# Patient Record
Sex: Male | Born: 1965 | Race: Black or African American | Hispanic: No | Marital: Single | Smoking: Never smoker
Health system: Southern US, Community
[De-identification: ages and names within clinical notes are randomized; demographics above are authoritative.]

## PROBLEM LIST (undated history)

## (undated) DIAGNOSIS — K219 Gastro-esophageal reflux disease without esophagitis: Secondary | ICD-10-CM

## (undated) DIAGNOSIS — E785 Hyperlipidemia, unspecified: Secondary | ICD-10-CM

## (undated) DIAGNOSIS — Z95810 Presence of automatic (implantable) cardiac defibrillator: Secondary | ICD-10-CM

## (undated) DIAGNOSIS — R51 Headache: Secondary | ICD-10-CM

## (undated) DIAGNOSIS — Z21 Asymptomatic human immunodeficiency virus [HIV] infection status: Secondary | ICD-10-CM

## (undated) DIAGNOSIS — Z8719 Personal history of other diseases of the digestive system: Secondary | ICD-10-CM

## (undated) DIAGNOSIS — I219 Acute myocardial infarction, unspecified: Secondary | ICD-10-CM

## (undated) DIAGNOSIS — B20 Human immunodeficiency virus [HIV] disease: Secondary | ICD-10-CM

## (undated) DIAGNOSIS — R519 Headache, unspecified: Secondary | ICD-10-CM

## (undated) DIAGNOSIS — M545 Low back pain, unspecified: Secondary | ICD-10-CM

## (undated) DIAGNOSIS — I509 Heart failure, unspecified: Secondary | ICD-10-CM

## (undated) DIAGNOSIS — I10 Essential (primary) hypertension: Secondary | ICD-10-CM

## (undated) DIAGNOSIS — G8929 Other chronic pain: Secondary | ICD-10-CM

## (undated) DIAGNOSIS — I2510 Atherosclerotic heart disease of native coronary artery without angina pectoris: Secondary | ICD-10-CM

## (undated) DIAGNOSIS — Z8711 Personal history of peptic ulcer disease: Secondary | ICD-10-CM

## (undated) DIAGNOSIS — G43909 Migraine, unspecified, not intractable, without status migrainosus: Secondary | ICD-10-CM

## (undated) DIAGNOSIS — R197 Diarrhea, unspecified: Secondary | ICD-10-CM

## (undated) DIAGNOSIS — Z9889 Other specified postprocedural states: Secondary | ICD-10-CM

## (undated) DIAGNOSIS — J45909 Unspecified asthma, uncomplicated: Secondary | ICD-10-CM

## (undated) DIAGNOSIS — K259 Gastric ulcer, unspecified as acute or chronic, without hemorrhage or perforation: Secondary | ICD-10-CM

## (undated) DIAGNOSIS — M1990 Unspecified osteoarthritis, unspecified site: Secondary | ICD-10-CM

## (undated) DIAGNOSIS — E162 Hypoglycemia, unspecified: Secondary | ICD-10-CM

## (undated) DIAGNOSIS — J349 Unspecified disorder of nose and nasal sinuses: Secondary | ICD-10-CM

## (undated) DIAGNOSIS — J189 Pneumonia, unspecified organism: Secondary | ICD-10-CM

## (undated) DIAGNOSIS — M542 Cervicalgia: Secondary | ICD-10-CM

## (undated) DIAGNOSIS — F32A Depression, unspecified: Secondary | ICD-10-CM

## (undated) DIAGNOSIS — F4310 Post-traumatic stress disorder, unspecified: Secondary | ICD-10-CM

## (undated) DIAGNOSIS — J392 Other diseases of pharynx: Secondary | ICD-10-CM

## (undated) DIAGNOSIS — F419 Anxiety disorder, unspecified: Secondary | ICD-10-CM

## (undated) DIAGNOSIS — F40240 Claustrophobia: Secondary | ICD-10-CM

## (undated) DIAGNOSIS — G709 Myoneural disorder, unspecified: Secondary | ICD-10-CM

## (undated) DIAGNOSIS — R1310 Dysphagia, unspecified: Secondary | ICD-10-CM

## (undated) DIAGNOSIS — H9390 Unspecified disorder of ear, unspecified ear: Secondary | ICD-10-CM

## (undated) DIAGNOSIS — Z860100 Personal history of colon polyps, unspecified: Secondary | ICD-10-CM

## (undated) HISTORY — PX: SPLENECTOMY, TOTAL: SHX788

## (undated) HISTORY — PX: HERNIA REPAIR: SHX51

## (undated) HISTORY — DX: Unspecified asthma, uncomplicated: J45.909

## (undated) HISTORY — DX: Other specified postprocedural states: Z98.890

## (undated) HISTORY — PX: TONSILLECTOMY: SUR1361

## (undated) HISTORY — DX: Diarrhea, unspecified: R19.7

## (undated) HISTORY — DX: Myoneural disorder, unspecified: G70.9

## (undated) HISTORY — DX: Personal history of colon polyps, unspecified: Z86.0100

## (undated) HISTORY — DX: Cervicalgia: M54.2

## (undated) HISTORY — PX: OTHER SURGICAL HISTORY: SHX169

## (undated) HISTORY — PX: WISDOM TOOTH EXTRACTION: SHX21

## (undated) HISTORY — DX: Hyperlipidemia, unspecified: E78.5

## (undated) HISTORY — DX: Atherosclerotic heart disease of native coronary artery without angina pectoris: I25.10

---

## 1989-08-30 HISTORY — PX: CYSTECTOMY: SUR359

## 2007-07-24 ENCOUNTER — Emergency Department: Admit: 2007-07-24 | Payer: Self-pay | Source: Emergency Department | Admitting: Emergency Medicine

## 2007-07-24 LAB — BASIC METABOLIC PANEL
BUN: 12 mg/dL (ref 8–20)
CO2: 28 mEq/L (ref 21–30)
Calcium: 8.9 mg/dL (ref 8.6–10.2)
Chloride: 104 mEq/L (ref 98–107)
Creatinine: 1.1 mg/dL (ref 0.6–1.5)
Glucose: 88 mg/dL (ref 70–100)
Potassium: 4 mEq/L (ref 3.6–5.0)
Sodium: 140 mEq/L (ref 136–146)

## 2007-07-24 LAB — CK: Creatine Kinase (CK): 515 U/L — ABNORMAL HIGH (ref 20–297)

## 2007-07-24 LAB — GFR

## 2007-07-25 LAB — CBC WITH AUTO DIFFERENTIAL CERNER
Basophils Absolute: 0 /mm3 (ref 0.0–0.2)
Basophils: 0 % (ref 0–2)
Eosinophils Absolute: 0.2 /mm3 (ref 0.0–0.7)
Eosinophils: 3 % (ref 0–5)
Granulocytes Absolute: 5.2 /mm3 (ref 1.8–8.1)
Hematocrit: 41.8 % — ABNORMAL LOW (ref 42.0–52.0)
Hgb: 14.2 G/DL (ref 13.0–17.0)
Lymphocytes Absolute: 3.5 /mm3 (ref 0.5–4.4)
Lymphocytes: 36 % (ref 15–41)
MCH: 30.9 PG (ref 28.0–32.0)
MCHC: 34 G/DL (ref 32.0–36.0)
MCV: 91.1 FL (ref 80.0–100.0)
MPV: 8.4 FL (ref 7.4–10.4)
Monocytes Absolute: 0.8 /mm3 (ref 0.0–1.2)
Monocytes: 9 % (ref 0–11)
Neutrophils %: 53 % (ref 52–75)
Platelets: 266 /mm3 (ref 140–400)
RBC: 4.59 /mm3 — ABNORMAL LOW (ref 4.70–6.00)
RDW: 13.6 % (ref 11.5–15.0)
WBC: 9.7 /mm3 (ref 3.5–10.8)

## 2007-07-25 LAB — D-DIMER - SOFT: D-Dimer: 260 ng FEU

## 2007-07-25 LAB — CKMB: CKMB Confirm: 3 ng/mL (ref 0–5)

## 2008-04-11 ENCOUNTER — Emergency Department: Admit: 2008-04-11 | Payer: Self-pay | Source: Emergency Department | Admitting: Emergency Medicine

## 2009-11-06 ENCOUNTER — Emergency Department: Admit: 2009-11-06 | Payer: Self-pay | Source: Emergency Department | Admitting: Emergency Medicine

## 2009-11-06 LAB — ACETAMINOPHEN LEVEL: Acetaminophen Level: <10 ug/mL

## 2009-11-06 LAB — CBC AND DIFFERENTIAL
Basophils Absolute: 0 /mm3 (ref 0.0–0.2)
Basophils: 0 % (ref 0–2)
Eosinophils Absolute: 0.2 /mm3 (ref 0.0–0.7)
Eosinophils: 2 % (ref 0–5)
Granulocytes Absolute: 5.8 /mm3 (ref 1.8–8.1)
Hematocrit: 46 % (ref 42.0–52.0)
Hgb: 15 G/DL (ref 13.0–17.0)
Immature Granulocytes Absolute: 0
Immature Granulocytes: 0 %
Lymphocytes Absolute: 2.6 /mm3 (ref 0.5–4.4)
Lymphocytes: 28 % (ref 15–41)
MCH: 31 PG (ref 28.0–32.0)
MCHC: 32.6 G/DL (ref 32.0–36.0)
MCV: 95 FL (ref 80.0–100.0)
MPV: 10.4 FL (ref 9.4–12.3)
Monocytes Absolute: 0.8 /mm3 (ref 0.0–1.2)
Monocytes: 8 % (ref 0–11)
Neutrophils %: 62 % (ref 52–75)
Platelets: 250 /mm3 (ref 140–400)
RBC: 4.84 /mm3 (ref 4.70–6.00)
RDW: 12.6 % (ref 11.5–15.0)
WBC: 9.36 /mm3 (ref 3.50–10.80)

## 2009-11-06 LAB — BASIC METABOLIC PANEL
BUN: 16 mg/dL (ref 8–20)
CO2: 28 mEq/L (ref 21–30)
Calcium: 9.2 mg/dL (ref 8.6–10.2)
Chloride: 102 mEq/L (ref 98–107)
Creatinine: 1.2 mg/dL (ref 0.6–1.5)
Glucose: 96 mg/dL (ref 70–100)
Potassium: 4.6 mEq/L (ref 3.6–5.0)
Sodium: 141 mEq/L (ref 136–146)

## 2009-11-06 LAB — URINALYSIS WITH MICROSCOPIC
Bilirubin, UA: NEGATIVE
Blood, UA: NEGATIVE
Glucose, UA: NEGATIVE
Ketones UA: NEGATIVE
Leukocyte Esterase, UA: NEGATIVE
Nitrite, UA: NEGATIVE
Protein, UR: 30
RBC, UA: 3 /HPF (ref 0–3)
Specific Gravity UA POCT: 1.019 (ref 1.001–1.035)
Squamous Epithelial Cells, Urine: <=1 /HPF
Urine pH: 8 (ref 5.0–8.0)
Urobilinogen, UA: 2 mg/dL (ref 0.2–2.0)

## 2009-11-06 LAB — RAPID DRUG SCREEN, URINE
Barbiturate Screen, UR: NEGATIVE
Benzodiazepine Screen, UR: NEGATIVE
Cocaine, UR: POSITIVE
Opiate Screen, UR: NEGATIVE
PCP Screen, UR: NEGATIVE
THC Urine: NEGATIVE
Urine Amphetamine Screen: NEGATIVE

## 2009-11-06 LAB — ETHANOL

## 2009-11-06 LAB — GFR

## 2009-12-30 DIAGNOSIS — I219 Acute myocardial infarction, unspecified: Secondary | ICD-10-CM

## 2009-12-30 HISTORY — DX: Acute myocardial infarction, unspecified: I21.9

## 2010-02-11 HISTORY — PX: CORONARY ANGIOPLASTY WITH STENT PLACEMENT: SHX49

## 2011-09-26 LAB — ECG 12-LEAD
Atrial Rate: 90 {beats}/min
P Axis: 50 degrees
P-R Interval: 154 ms
Q-T Interval: 380 ms
QRS Duration: 74 ms
QTC Calculation (Bezet): 464 ms
R Axis: -26 degrees
T Axis: 51 degrees
Ventricular Rate: 90 {beats}/min

## 2011-10-08 LAB — ECG 12-LEAD
Atrial Rate: 81 {beats}/min
P Axis: 51 degrees
P-R Interval: 156 ms
Q-T Interval: 378 ms
QRS Duration: 80 ms
QTC Calculation (Bezet): 439 ms
R Axis: -16 degrees
T Axis: 46 degrees
Ventricular Rate: 81 {beats}/min

## 2012-02-14 ENCOUNTER — Ambulatory Visit: Payer: Self-pay

## 2012-02-14 ENCOUNTER — Ambulatory Visit: Admission: RE | Admit: 2012-02-14 | Payer: Self-pay | Source: Ambulatory Visit

## 2012-02-15 LAB — LAB USE ONLY - HISTORICAL SURGICAL PATHOLOGY

## 2012-02-17 NOTE — Op Note (Signed)
Bill Kaiser, Bill Kaiser                                       MRN:          [id number]                                                          Account:      [id number]                                        Document ID:  [id number]                                                   Procedure Date: 02/14/2012                                                                                    Admit Date: 02/14/2012     Patient Location: DISCHARGED 02/14/2012  Patient Type: A     SURGEON: Heywood Bene MD  ASSISTANT:        ASSISTANT:  Sue Lush _____.     PREOPERATIVE DIAGNOSIS:  Incisional hernia.     POSTOPERATIVE DIAGNOSIS:  Incisional hernia.     TITLE OF PROCEDURE:  Repair incisional hernia.     ANESTHESIA:  General LMA.     DESCRIPTION OF PROCEDURE:  The patient was brought to the operating room.  He was given general LMA  anesthesia.  Abdomen was prepped and draped in the usual manner.  Examination revealed a 3 x 3-cm hernia with about 1 cm defect of the fascia  right in the umbilicus.  Over the previous scar about 5 cm long, up and  down incision was made.  Subcutaneous tissue was divided.  The sac was  identified.  It was separated from the umbilical skin and it was found to  be empty.  It was opened.  Omentum was adherent to the part of the sac, it  was separated and omentum was pushed in.  It was seen that the big up and  down incision had made a hole in the umbilical region and that is where the  hernia was coming from.  The sac and the incision was slightly enlarged to  open up so that a finger could be inserted into the peritoneal cavity, the  up and down incision was felt, no other herniation was felt anywhere.  At  this time, the sac was excised with the cautery and the edges were held  with the Allis clamps.  Repair was performed with 0 Prolene sutures in  buttress fashion, 2 such sutures were applied.  A good repair  was obtained.   Once this was done, the umbilical skin was anchored to the fascia,  subcutaneous  tissue was approximated with 3-0 Vicryl in interrupted  fashion.  Marcaine 0.5% 10 mL with epinephrine was infiltrated into the  incision and skin was closed with 4-0 Monocryl in subcuticular fashion, and  Steri-Strip dressings were applied.  The patient tolerated the procedure                                                                                                           Page 1 of 2  LAVAR, ROSENZWEIG                                       MRN:          [id number]                                                          Account:      [id number]                                        Document ID:  [id number]                                                   Procedure Date: 02/14/2012                                                                                    well and he was transported to recovery room in good condition.           Electronic Signing Provider     D:  02/14/2012 14:04 PM by Dr. Heywood Bene, MD [phone])  T:  02/15/2012 12:27 PM by [id number]        cc:  Page 2 of 2  Authenticated by Heywood Bene, MD [phone]) On 02/17/2012 12:11:59 PM

## 2013-03-11 HISTORY — PX: CORONARY ANGIOPLASTY WITH STENT PLACEMENT: SHX49

## 2013-10-13 ENCOUNTER — Ambulatory Visit (INDEPENDENT_AMBULATORY_CARE_PROVIDER_SITE_OTHER): Payer: Self-pay | Admitting: Internal Medicine

## 2013-10-13 ENCOUNTER — Encounter: Payer: Self-pay | Admitting: Internal Medicine

## 2013-10-13 VITALS — BP 105/69 | HR 65 | Temp 98.1°F | Wt 184.0 lb

## 2013-10-13 DIAGNOSIS — B20 Human immunodeficiency virus [HIV] disease: Secondary | ICD-10-CM

## 2013-10-13 DIAGNOSIS — Z113 Encounter for screening for infections with a predominantly sexual mode of transmission: Secondary | ICD-10-CM

## 2013-10-13 LAB — CBC WITH DIFFERENTIAL/PLATELET
Eosinophils Absolute: 0.1 10*3/uL (ref 0.0–0.7)
Eosinophils Relative: 2 % (ref 0–5)
HCT: 40.8 % (ref 39.0–52.0)
Lymphocytes Relative: 54 % — ABNORMAL HIGH (ref 12–46)
Lymphs Abs: 2 10*3/uL (ref 0.7–4.0)
MCH: 30.5 pg (ref 26.0–34.0)
MCV: 87.6 fL (ref 78.0–100.0)
Monocytes Absolute: 0.4 10*3/uL (ref 0.1–1.0)
Monocytes Relative: 11 % (ref 3–12)
Neutro Abs: 1.2 10*3/uL — ABNORMAL LOW (ref 1.7–7.7)
Neutrophils Relative %: 32 % — ABNORMAL LOW (ref 43–77)
Platelets: 151 10*3/uL (ref 150–400)
RBC: 4.66 MIL/uL (ref 4.22–5.81)
RDW: 15.1 % (ref 11.5–15.5)
WBC: 3.7 10*3/uL — ABNORMAL LOW (ref 4.0–10.5)

## 2013-10-13 LAB — COMPREHENSIVE METABOLIC PANEL
ALT: 15 U/L (ref 0–53)
Albumin: 4.4 g/dL (ref 3.5–5.2)
Alkaline Phosphatase: 57 U/L (ref 39–117)
CO2: 30 mEq/L (ref 19–32)
Calcium: 9.6 mg/dL (ref 8.4–10.5)
Chloride: 104 mEq/L (ref 96–112)
Glucose, Bld: 89 mg/dL (ref 70–99)
Potassium: 4.5 mEq/L (ref 3.5–5.3)
Sodium: 139 mEq/L (ref 135–145)
Total Bilirubin: 0.4 mg/dL (ref 0.3–1.2)
Total Protein: 6.9 g/dL (ref 6.0–8.3)

## 2013-10-13 MED ORDER — EFAVIRENZ-EMTRICITAB-TENOFOVIR 600-200-300 MG PO TABS: 1.0000 | ORAL_TABLET | Freq: Every day | ORAL | Status: DC

## 2013-10-13 NOTE — Progress Notes (Signed)
[facility]  RFV: establishing care Subjective:    Patient ID: Edward Haley, male    DOB: [birth year], 47 y.o.   MRN: [id number]  HPI 47yo M, HIV disease, CD 4 count 1340/VL<20, atripla. hiv dx in 1993. CAD s/p MI s/p PCI in 2011 s/p EF 15%. C/b shingles, syphilis (txd in [place]) Complicated by N/V and weight gain. Previously followed by [facility]. Previously on RLG/EFV/TDF but changed due to interaction with heart medicaitons  No current outpatient prescriptions on file prior to visit.   No current facility-administered medications on file prior to visit.   Active Ambulatory Problems    Diagnosis Date Noted  . No Active Ambulatory Problems   Resolved Ambulatory Problems    Diagnosis Date Noted  . No Resolved Ambulatory Problems   No Additional Past Medical History   Soc hx: worked in [employer] and wife relocated from [place], [place]. 2 grandkids.  family hx: CAD, breast ca, CVA   Review of Systems  Constitutional: Negative for fever, chills, diaphoresis, activity change, appetite change, fatigue and unexpected weight change.  HENT: Negative for congestion, sore throat, rhinorrhea, sneezing, trouble swallowing and sinus pressure.  Eyes: Negative for photophobia and visual disturbance.  Respiratory: SOB with exertion. Negative for cough, chest tightness, shortness of breath, wheezing and stridor.  Cardiovascular: Negative for chest pain, palpitations and leg swelling.  Gastrointestinal: Negative for nausea, vomiting, abdominal pain, diarrhea, constipation, blood in stool, abdominal distention and anal bleeding.  Genitourinary: Negative for dysuria, hematuria, flank pain and difficulty urinating.  Musculoskeletal: Negative for myalgias, back pain, joint swelling, arthralgias and gait problem.  Skin: Negative for color change, pallor, rash and wound.  Neurological: Negative for dizziness, tremors, weakness and light-headedness.  Hematological: Negative for adenopathy. Does not  bruise/bleed easily.  Psychiatric/Behavioral: Negative for behavioral problems, confusion, sleep disturbance, dysphoric mood, decreased concentration and agitation.       Objective:   Physical Exam BP 105/69  Pulse 65  Temp(Src) 98.1 F (36.7 C) (Oral)  Wt 184 lb (83.462 kg) Physical Exam  Constitutional: He is oriented to person, place, and time. He appears well-developed and well-nourished. No distress.  HENT:  Mouth/Throat: Oropharynx is clear and moist. No oropharyngeal exudate.  Cardiovascular: Normal rate, regular rhythm and normal heart sounds. Exam reveals no gallop and no friction rub.  No murmur heard.  Pulmonary/Chest: Effort normal and breath sounds normal. No respiratory distress. He has no wheezes.  Abdominal: Soft. Bowel sounds are normal. He exhibits no distension. There is no tenderness.  Lymphadenopathy:  He has no cervical adenopathy.  Neurological: He is alert and oriented to person, place, and time.  Skin: Skin is warm and dry. No rash noted. No erythema.  Psychiatric: He has a normal mood and affect. His behavior is normal.       Assessment & Plan:  HIv = will continue with atripla. Will need to get outside records. Will get labs  Cad = asa 81mg , lisinopril, plavix , crestor, nitrostat, coreg  Health maintenance = will need to arrange flu vaccine

## 2013-10-15 LAB — T-HELPER CELL (CD4) - (RCID CLINIC ONLY): CD4 % Helper T Cell: 37 % (ref 33–55)

## 2013-10-15 LAB — HIV-1 RNA QUANT-NO REFLEX-BLD: HIV 1 RNA Quant: <20 copies/mL (ref <20)

## 2013-10-22 ENCOUNTER — Emergency Department (HOSPITAL_COMMUNITY)
Admission: EM | Admit: 2013-10-22 | Discharge: 2013-10-22 | Disposition: A | Discharge status: Home / Self Care | Payer: Self-pay | Attending: Emergency Medicine | Admitting: Emergency Medicine

## 2013-10-22 ENCOUNTER — Encounter (HOSPITAL_COMMUNITY): Payer: Self-pay | Admitting: Emergency Medicine

## 2013-10-22 DIAGNOSIS — S336XXA Sprain of sacroiliac joint, initial encounter: Secondary | ICD-10-CM | POA: Insufficient documentation

## 2013-10-22 DIAGNOSIS — S39012A Strain of muscle, fascia and tendon of lower back, initial encounter: Secondary | ICD-10-CM

## 2013-10-22 DIAGNOSIS — I10 Essential (primary) hypertension: Secondary | ICD-10-CM | POA: Insufficient documentation

## 2013-10-22 DIAGNOSIS — Z7902 Long term (current) use of antithrombotics/antiplatelets: Secondary | ICD-10-CM | POA: Insufficient documentation

## 2013-10-22 DIAGNOSIS — Y9389 Activity, other specified: Secondary | ICD-10-CM | POA: Insufficient documentation

## 2013-10-22 DIAGNOSIS — Y92009 Unspecified place in unspecified non-institutional (private) residence as the place of occurrence of the external cause: Secondary | ICD-10-CM | POA: Insufficient documentation

## 2013-10-22 DIAGNOSIS — Z7982 Long term (current) use of aspirin: Secondary | ICD-10-CM | POA: Insufficient documentation

## 2013-10-22 DIAGNOSIS — Z79899 Other long term (current) drug therapy: Secondary | ICD-10-CM | POA: Insufficient documentation

## 2013-10-22 DIAGNOSIS — Z87828 Personal history of other (healed) physical injury and trauma: Secondary | ICD-10-CM | POA: Insufficient documentation

## 2013-10-22 DIAGNOSIS — I252 Old myocardial infarction: Secondary | ICD-10-CM | POA: Insufficient documentation

## 2013-10-22 DIAGNOSIS — Z21 Asymptomatic human immunodeficiency virus [HIV] infection status: Secondary | ICD-10-CM | POA: Insufficient documentation

## 2013-10-22 DIAGNOSIS — X500XXA Overexertion from strenuous movement or load, initial encounter: Secondary | ICD-10-CM | POA: Insufficient documentation

## 2013-10-22 HISTORY — DX: Human immunodeficiency virus (HIV) disease: B20

## 2013-10-22 HISTORY — DX: Acute myocardial infarction, unspecified: I21.9

## 2013-10-22 HISTORY — DX: Essential (primary) hypertension: I10

## 2013-10-22 HISTORY — DX: Asymptomatic human immunodeficiency virus (hiv) infection status: Z21

## 2013-10-22 MED ORDER — HYDROCODONE-ACETAMINOPHEN 5-325 MG PO TABS: 1.0000 | ORAL_TABLET | ORAL | Status: DC | PRN

## 2013-10-22 MED ORDER — ACETAMINOPHEN 500 MG PO TABS
1000.0000 mg | ORAL_TABLET | Freq: Once | ORAL | Status: AC
  Administered 2013-10-22: 1000 mg via ORAL
  Filled 2013-10-22: qty 2

## 2013-10-22 MED ORDER — DIAZEPAM 5 MG/ML IJ SOLN
10.0000 mg | Freq: Once | INTRAMUSCULAR | Status: AC
  Administered 2013-10-22: 10 mg via INTRAMUSCULAR
  Filled 2013-10-22: qty 2

## 2013-10-22 MED ORDER — DIAZEPAM 5 MG PO TABS: 5.0000 mg | ORAL_TABLET | Freq: Two times a day (BID) | ORAL | Status: DC

## 2013-10-22 NOTE — ED Provider Notes (Signed)
CSN: [id number]     Arrival date & time 10/22/13  1640 History  This chart was scribed for non-physician practitioner Marlon Pel working with Enid Skeens, MD by Carl Best, ED Scribe. This patient was seen in room TR10C/TR10C and the patient's care was started at 5:00 PM.     Chief Complaint  Patient presents with  . Back Pain    The history is provided by the patient. No language interpreter was used.   HPI Comments: Edward Haley is a 47 y.o. Male with a history of back injury that occurred about a couple of years ago who presents to the Emergency Department complaining of constant back pain that started today after he was working in his sister's garden.  He states that sitting alleviates the pain.  The patient states that when he walks the pain radiates from his back to his legs.  The patient denies being allergic to any medications.  The patient states that he is visiting from [facility].  No loss of bowel or urine control. No nausea, vomiting, diarrhea or weakness.  Past Medical History  Diagnosis Date  . HIV infection   . MI (myocardial infarction)   . Hypertension    History reviewed. No pertinent past surgical history. No family history on file. History  Substance Use Topics  . Smoking status: Never Smoker   . Smokeless tobacco: Never Used  . Alcohol Use: No    Review of Systems  Musculoskeletal: Positive for back pain.  All other systems reviewed and are negative.    Allergies  Shellfish allergy  Home Medications   Current Outpatient Rx  Name  Route  Sig  Dispense  Refill  . aspirin EC 325 MG tablet   Oral   Take 325 mg by mouth daily.         . carvedilol (COREG) 12.5 MG tablet   Oral   Take 12.5 mg by mouth 2 (two) times daily with a meal.         . clopidogrel (PLAVIX) 75 MG tablet   Oral   Take 75 mg by mouth at bedtime.          . diphenhydrAMINE (BENADRYL) 25 mg capsule   Oral   Take 25 mg by mouth 2 (two) times daily.           Marland Kitchen dronabinol (MARINOL) 2.5 MG capsule   Oral   Take 2.5 mg by mouth at bedtime.          Marland Kitchen efavirenz-emtricitabine-tenofovir (ATRIPLA) 600-200-300 MG per tablet   Oral   Take 1 tablet by mouth at bedtime.   30 tablet   5   . gabapentin (NEURONTIN) 100 MG capsule   Oral   Take 100 mg by mouth 3 (three) times daily.         Marland Kitchen lisinopril (PRINIVIL,ZESTRIL) 20 MG tablet   Oral   Take 10 mg by mouth at bedtime.         . Multiple Vitamin (MULTIVITAMIN WITH MINERALS) TABS tablet   Oral   Take 1 tablet by mouth at bedtime.         . nitroGLYCERIN (NITROSTAT) 0.4 MG SL tablet   Sublingual   Place 0.4 mg under the tongue every 5 (five) minutes as needed for chest pain.         . promethazine (PHENERGAN) 25 MG tablet   Oral   Take 25 mg by mouth 2 (two) times daily as needed for  nausea.          . rosuvastatin (CRESTOR) 40 MG tablet   Oral   Take 40 mg by mouth at bedtime.          . tadalafil (CIALIS) 10 MG tablet   Oral   Take 10 mg by mouth daily as needed for erectile dysfunction.         . diazepam (VALIUM) 5 MG tablet   Oral   Take 1 tablet (5 mg total) by mouth 2 (two) times daily.   10 tablet   0   . HYDROcodone-acetaminophen (NORCO/VICODIN) 5-325 MG per tablet   Oral   Take 1 tablet by mouth every 4 (four) hours as needed for pain.   15 tablet   0    Triage Vitals: BP 109/77  Pulse 84  Temp(Src) 97.9 F (36.6 C) (Oral)  Resp 18  Ht 6\' 1"  (1.854 m)  Wt 190 lb (86.183 kg)  BMI 25.07 kg/m2  SpO2 99%  Physical Exam  Nursing note and vitals reviewed. Constitutional: He is oriented to person, place, and time. He appears well-developed and well-nourished. No distress.  HENT:  Head: Normocephalic and atraumatic.  Eyes: EOM are normal.  Neck: Neck supple. No tracheal deviation present.  Cardiovascular: Normal rate.   Pulmonary/Chest: Effort normal. No respiratory distress.  Musculoskeletal: Normal range of motion.   Equal strength to  bilateral lower extremities. Neurosensory function adequate to both legs. Skin color is normal. Skin is warm and moist. I see no step off deformity, no bony tenderness. Pt is able to ambulate without limp. Pain is relieved when sitting in certain positions. ROM is decreased due to pain. No crepitus, laceration, effusion, swelling.  Pulses are normal    Neurological: He is alert and oriented to person, place, and time.  Skin: Skin is warm and dry.  Psychiatric: He has a normal mood and affect. His behavior is normal.    ED Course  Procedures (including critical care time)  DIAGNOSTIC STUDIES: Oxygen Saturation is 99% on room air, normal by my interpretation.    COORDINATION OF CARE: 5:03 PM- Discussed administering a shot of valium and tylenol in the ED and the patient agreed to the treatment plan.   Patients pain significantly relieved with the therapy.  Labs Review Labs Reviewed - No data to display Imaging Review No results found.  EKG Interpretation   None       MDM   1. Low back strain, initial encounter    47 y.o.Edward Haley's  with back pain. No neurological deficits and normal neuro exam. Patient can walk but states is painful. No loss of bowel or bladder control. No concern for cauda equina. No fever, night sweats, weight loss, h/o cancer, IVDU. RICE protocol and pain medicine indicated and discussed with patient.   Patient Plan 1. Medications: narcotic pain medication, muscle relaxer and usual home medications  2. Treatment: rest, drink plenty of fluids, gentle stretching as discussed, alternate ice and heat  3. Follow Up: Please followup with your primary doctor for discussion of your diagnoses and further evaluation after today's visit; if you do not have a primary care doctor use the resource guide provided to find one   Vital signs are stable at discharge. Filed Vitals:   10/22/13 1644  BP: 109/77  Pulse: 84  Temp: 97.9 F (36.6 C)  Resp: 18     Patient/guardian has voiced understanding and agreed to follow-up with the PCP or specialist.  I personally performed the services described in this documentation, which was scribed in my presence. The recorded information has been reviewed and is accurate.    Dorthula Matas, PA-C 10/22/13 1758

## 2013-10-22 NOTE — ED Notes (Signed)
History of back pain and worsening overtime. Working in yard and developed lower back currently 10/10 achy sharp.

## 2013-10-23 NOTE — ED Provider Notes (Signed)
Medical screening examination/treatment/procedure(s) were performed by non-physician practitioner and as supervising physician I was immediately available for consultation/collaboration.  EKG Interpretation   None         Enid Skeens, MD 10/23/13 [phone]

## 2013-11-04 ENCOUNTER — Other Ambulatory Visit: Payer: Self-pay | Admitting: Licensed Clinical Social Worker

## 2013-11-04 DIAGNOSIS — B20 Human immunodeficiency virus [HIV] disease: Secondary | ICD-10-CM

## 2013-11-04 MED ORDER — EFAVIRENZ-EMTRICITAB-TENOFOVIR 600-200-300 MG PO TABS: 1.0000 | ORAL_TABLET | Freq: Every day | ORAL | Status: DC

## 2013-11-11 ENCOUNTER — Encounter: Payer: Self-pay | Admitting: Internal Medicine

## 2013-11-11 ENCOUNTER — Ambulatory Visit (INDEPENDENT_AMBULATORY_CARE_PROVIDER_SITE_OTHER): Payer: Self-pay | Admitting: Internal Medicine

## 2013-11-11 VITALS — BP 106/67 | HR 62 | Temp 98.0°F | Wt 189.0 lb

## 2013-11-11 DIAGNOSIS — I259 Chronic ischemic heart disease, unspecified: Secondary | ICD-10-CM

## 2013-11-11 DIAGNOSIS — N529 Male erectile dysfunction, unspecified: Secondary | ICD-10-CM

## 2013-11-11 DIAGNOSIS — B20 Human immunodeficiency virus [HIV] disease: Secondary | ICD-10-CM

## 2013-11-11 DIAGNOSIS — Z23 Encounter for immunization: Secondary | ICD-10-CM

## 2013-11-11 MED ORDER — ROSUVASTATIN CALCIUM 40 MG PO TABS: 40.0000 mg | ORAL_TABLET | Freq: Every day | ORAL | Status: DC

## 2013-11-11 MED ORDER — PROMETHAZINE HCL 50 MG PO TABS: 50.0000 mg | ORAL_TABLET | Freq: Three times a day (TID) | ORAL | Status: DC | PRN

## 2013-11-11 MED ORDER — TADALAFIL 20 MG PO TABS: 20.0000 mg | ORAL_TABLET | Freq: Every day | ORAL | Status: DC | PRN

## 2013-11-11 MED ORDER — CARVEDILOL 12.5 MG PO TABS: 12.5000 mg | ORAL_TABLET | Freq: Two times a day (BID) | ORAL | Status: DC

## 2013-11-11 MED ORDER — EFAVIRENZ-EMTRICITAB-TENOFOVIR 600-200-300 MG PO TABS: 1.0000 | ORAL_TABLET | Freq: Every day | ORAL | Status: DC

## 2013-11-11 MED ORDER — DRONABINOL 2.5 MG PO CAPS: 2.5000 mg | ORAL_CAPSULE | Freq: Two times a day (BID) | ORAL | Status: DC

## 2013-11-11 MED ORDER — ENSURE PO LIQD: 237.0000 mL | Freq: Two times a day (BID) | ORAL | Status: DC

## 2013-11-11 MED ORDER — CLOPIDOGREL BISULFATE 75 MG PO TABS: 75.0000 mg | ORAL_TABLET | Freq: Every day | ORAL | Status: DC

## 2013-11-11 MED ORDER — LISINOPRIL 10 MG PO TABS: 5.0000 mg | ORAL_TABLET | Freq: Every day | ORAL | Status: DC

## 2013-11-11 NOTE — Progress Notes (Signed)
[facility]  RFV: estabilshing care Subjective:    Patient ID: Edward Haley, male    DOB: 23-Nov-1966, 47 y.o.   MRN: [id number]  HPI 47yo M, HIV disease, CD 4 count 780/VL<20 (in oct 2014), atripla. hiv dx in 1993. CAD s/p MI s/p PCI in 2011 s/p EF 15%. C/b shingles, syphilis (txd in [place]) Complicated by N/V and weight gain. Previously followed by [facility]. Previously on RLG/EFV/TDF but changed due to interaction with heart medicaitons. He is doing well but trying to establish care with new providers since he has moved to [facility]. He states that he has chronic headache which started after his MI in 2011. He states that his HA is localized to left side, behind eye. Throbbing, takes tylenol to alleviate pain.  Current Outpatient Prescriptions on File Prior to Visit  Medication Sig Dispense Refill  . aspirin EC 325 MG tablet Take 325 mg by mouth daily.      . carvedilol (COREG) 12.5 MG tablet Take 12.5 mg by mouth 2 (two) times daily with a meal.      . clopidogrel (PLAVIX) 75 MG tablet Take 75 mg by mouth at bedtime.       . diazepam (VALIUM) 5 MG tablet Take 1 tablet (5 mg total) by mouth 2 (two) times daily.  10 tablet  0  . diphenhydrAMINE (BENADRYL) 25 mg capsule Take 25 mg by mouth 2 (two) times daily.       Marland Kitchen dronabinol (MARINOL) 2.5 MG capsule Take 2.5 mg by mouth at bedtime.       Marland Kitchen efavirenz-emtricitabine-tenofovir (ATRIPLA) 600-200-300 MG per tablet Take 1 tablet by mouth at bedtime.  30 tablet  5  . gabapentin (NEURONTIN) 100 MG capsule Take 100 mg by mouth 3 (three) times daily.      Marland Kitchen HYDROcodone-acetaminophen (NORCO/VICODIN) 5-325 MG per tablet Take 1 tablet by mouth every 4 (four) hours as needed for pain.  15 tablet  0  . lisinopril (PRINIVIL,ZESTRIL) 20 MG tablet Take 10 mg by mouth at bedtime.      . Multiple Vitamin (MULTIVITAMIN WITH MINERALS) TABS tablet Take 1 tablet by mouth at bedtime.      . nitroGLYCERIN (NITROSTAT) 0.4 MG SL tablet Place 0.4 mg under the tongue every 5  (five) minutes as needed for chest pain.      . promethazine (PHENERGAN) 25 MG tablet Take 25 mg by mouth 2 (two) times daily as needed for nausea.       . rosuvastatin (CRESTOR) 40 MG tablet Take 40 mg by mouth at bedtime.        No current facility-administered medications on file prior to visit.   Active Ambulatory Problems    Diagnosis Date Noted  . Human immunodeficiency virus (HIV) disease 10/13/2013   Resolved Ambulatory Problems    Diagnosis Date Noted  . No Resolved Ambulatory Problems   Past Medical History  Diagnosis Date  . HIV infection   . MI (myocardial infarction)   . Hypertension        Review of Systems  Constitutional: Negative for fever, chills, diaphoresis, activity change, appetite change, fatigue and unexpected weight change.  HENT: Negative for congestion, sore throat, rhinorrhea, sneezing, trouble swallowing and sinus pressure.  Eyes: Negative for photophobia and visual disturbance.  Respiratory: Negative for cough, chest tightness, shortness of breath, wheezing and stridor.  Cardiovascular: Negative for chest pain, palpitations and leg swelling.  Gastrointestinal: Negative for nausea, vomiting, abdominal pain, diarrhea, constipation, blood in stool,  abdominal distention and anal bleeding.  Genitourinary: Negative for dysuria, hematuria, flank pain and difficulty urinating.  Musculoskeletal: Negative for myalgias, back pain, joint swelling, arthralgias and gait problem.  Skin: Negative for color change, pallor, rash and wound.  Neurological: positive for headache Hematological: Negative for adenopathy. Does not bruise/bleed easily.  Psychiatric/Behavioral: Negative for behavioral problems, confusion, sleep disturbance, dysphoric mood, decreased concentration and agitation.       Objective:   Physical Exam BP 106/67  Pulse 62  Temp(Src) 98 F (36.7 C) (Oral)  Wt 189 lb (85.73 kg) Physical Exam  Constitutional: He is oriented to person, place,  and time. He appears well-developed and well-nourished. No distress.  HENT:  Mouth/Throat: Oropharynx is clear and moist. No oropharyngeal exudate.  Cardiovascular: Normal rate, regular rhythm and normal heart sounds. Exam reveals no gallop and no friction rub.  No murmur heard.  Pulmonary/Chest: Effort normal and breath sounds normal. No respiratory distress. He has no wheezes.  Abdominal: Soft. Bowel sounds are normal. He exhibits no distension. There is no tenderness.  Lymphadenopathy:  He has no cervical adenopathy.  Neurological: He is alert and oriented to person, place, and time.  Skin: Skin is warm and dry. No rash noted. No erythema.  Psychiatric: He has a normal mood and affect. His behavior is normal.      Assessment & Plan:  Headaches = unilateral, behind left eye.  Lasting 3-4 days, roughly 4 events a month. Concern that it sounds like cluster headache. Now improving. May need to consider referral to pcp/neurologist for headache management  hiv =taking atripla. Continue taking it since good virologic control  CAD = will reorder his meds for him; needs to be established with cardiology  Erectile dysfunction = will refill cialis

## 2013-12-06 ENCOUNTER — Ambulatory Visit (INDEPENDENT_AMBULATORY_CARE_PROVIDER_SITE_OTHER): Payer: Self-pay | Admitting: Internal Medicine

## 2013-12-06 ENCOUNTER — Encounter: Payer: Self-pay | Admitting: Internal Medicine

## 2013-12-06 ENCOUNTER — Telehealth: Payer: Self-pay | Admitting: *Deleted

## 2013-12-06 VITALS — BP 137/89 | HR 75 | Temp 98.2°F | Wt 198.0 lb

## 2013-12-06 DIAGNOSIS — B349 Viral infection, unspecified: Secondary | ICD-10-CM

## 2013-12-06 DIAGNOSIS — B9789 Other viral agents as the cause of diseases classified elsewhere: Secondary | ICD-10-CM

## 2013-12-06 NOTE — Telephone Encounter (Signed)
Patient called reporting flu-like symptoms x 4 days, no acute distress.  Patient states "everyone at work is sick" and that OTC meds haven't been helping.  RN suggested supportive care, stating that a virus usually takes 7-10 days to clear, but patient requested an appointment.  Appointment given with Dr. Luciana Axe at 4:15 today. Andree Coss, RN

## 2013-12-10 DIAGNOSIS — B349 Viral infection, unspecified: Secondary | ICD-10-CM | POA: Insufficient documentation

## 2013-12-10 NOTE — Progress Notes (Signed)
   Subjective:    Patient ID: Edward Haley, male    DOB: February 02, 1966, 47 y.o.   MRN: [id number]  HPI Here for a work in visit.  Has been on Atripla with good compliance.  For 4-5 days has had body aches, cough, congestion.  Taking Dayquil and has been able to work.  No diarrhea, no n/v.  + sick contacts at work.     Review of Systems  Constitutional: Positive for appetite change and fatigue. Negative for fever and chills.  HENT: Positive for sore throat.   Respiratory: Positive for cough. Negative for shortness of breath.   Gastrointestinal: Positive for diarrhea. Negative for nausea, vomiting and abdominal pain.  Skin: Negative for rash.       Objective:   Physical Exam  Constitutional: He appears well-developed and well-nourished. No distress.  HENT:  Some mild erythematous streaking in pharynx  Eyes: No scleral icterus.  Cardiovascular: Normal rate, regular rhythm and normal heart sounds.   No murmur heard. Pulmonary/Chest: Effort normal and breath sounds normal. No respiratory distress. He has no wheezes.  Lymphadenopathy:    He has no cervical adenopathy.  Skin: No rash noted.          Assessment & Plan:

## 2013-12-10 NOTE — Assessment & Plan Note (Signed)
Rest, OTC therapy.  Past window for Tamiflu.

## 2014-01-04 ENCOUNTER — Telehealth: Payer: Self-pay

## 2014-01-24 ENCOUNTER — Ambulatory Visit: Payer: No Typology Code available for payment source | Attending: Internal Medicine

## 2014-02-04 ENCOUNTER — Encounter: Payer: Self-pay | Admitting: *Deleted

## 2014-02-11 ENCOUNTER — Ambulatory Visit (INDEPENDENT_AMBULATORY_CARE_PROVIDER_SITE_OTHER): Payer: No Typology Code available for payment source | Admitting: Internal Medicine

## 2014-02-11 ENCOUNTER — Telehealth: Payer: Self-pay | Admitting: *Deleted

## 2014-02-11 ENCOUNTER — Encounter: Payer: Self-pay | Admitting: Internal Medicine

## 2014-02-11 VITALS — BP 122/90 | HR 66 | Ht 73.0 in | Wt 209.0 lb

## 2014-02-11 DIAGNOSIS — I255 Ischemic cardiomyopathy: Secondary | ICD-10-CM

## 2014-02-11 DIAGNOSIS — I253 Aneurysm of heart: Secondary | ICD-10-CM

## 2014-02-11 DIAGNOSIS — I252 Old myocardial infarction: Secondary | ICD-10-CM

## 2014-02-11 DIAGNOSIS — I2510 Atherosclerotic heart disease of native coronary artery without angina pectoris: Secondary | ICD-10-CM

## 2014-02-11 DIAGNOSIS — I2589 Other forms of chronic ischemic heart disease: Secondary | ICD-10-CM

## 2014-02-11 DIAGNOSIS — R9431 Abnormal electrocardiogram [ECG] [EKG]: Secondary | ICD-10-CM

## 2014-02-11 NOTE — Patient Instructions (Signed)
Your physician wants you to follow-up in: 3 months.   Your physician has requested that you have an echocardiogram. Echocardiography is a painless test that uses sound waves to create images of your heart. It provides your doctor with information about the size and shape of your heart and how well your heart's chambers and valves are working. This procedure takes approximately one hour. There are no restrictions for this procedure.

## 2014-02-11 NOTE — Telephone Encounter (Signed)
Patient called to reschedule his appt for 02/15/14 due to an appt with cardiology. Rescheduled for 02/28/14. He also wanted to let Dr. Drue Second know he has established with [facility] for PCP and has changed pharmacies to [facility]. Wendall Mola

## 2014-02-14 ENCOUNTER — Encounter: Payer: Self-pay | Admitting: Internal Medicine

## 2014-02-14 DIAGNOSIS — I253 Aneurysm of heart: Secondary | ICD-10-CM | POA: Insufficient documentation

## 2014-02-14 DIAGNOSIS — I2510 Atherosclerotic heart disease of native coronary artery without angina pectoris: Secondary | ICD-10-CM | POA: Insufficient documentation

## 2014-02-14 DIAGNOSIS — I255 Ischemic cardiomyopathy: Secondary | ICD-10-CM | POA: Insufficient documentation

## 2014-02-14 DIAGNOSIS — I252 Old myocardial infarction: Secondary | ICD-10-CM | POA: Insufficient documentation

## 2014-02-14 NOTE — Progress Notes (Signed)
OFFICE NOTE  Chief Complaint:  Establish new cardiologist, fatigue, shortness of breath with exertion  Primary Care Physician: Jeanann LewandowskyJEGEDE, OLUGBEMIGA, MD  HPI:  Edward Haley is a pleasant 48 year old male with an extensive past medical history.  Unfortunately he has a history of HIV disease but has a undetectable viral load. He's been on almost every antiviral medication over the years since his diagnosis which I believe was in the 1980s or 1990s.  This is a history of incarceration for 17 years.  He subsequently developed coronary artery disease and does have a strong family history of this. In 2011 he had his first MI which was a major anterior MI.  He had placement of a vision 5.0 x 18 MM MultiLink bare metal stent to the LAD and February 2011 at [facility] in [place].  Subsequently, he developed recurrent coronary disease and had 2 stents placed in the right coronary artery in March of 2014 in [place].  These were Promus Premier drug-eluting stents (2.5 x 16 mm and 2.5 x 20 mm), both to the RCA.  He has mentioned that on nuclear stress testing he was told to have an EF of 15%, with extensive scar, however I do not have his cardiac records available today for evaluation. He is most recent cardiologist was at [facility] in [place].   PMHx:  Past Medical History  Diagnosis Date  . HIV infection   . MI (myocardial infarction)   . Hypertension   . Coronary artery disease   . Hyperlipidemia     Past Surgical History  Procedure Laterality Date  . Coronary angioplasty with stent placement  03/11/2013    [facility] [place]) - Promus Premiere Monorail 2.5x8216mm to RCA & 2.5x420mm to RCA  . Coronary angioplasty with stent placement  02/11/2010    [facility] [place] [place] - Multi-Link 5.0x2518mm to LAD    FAMHx:  No family history on file.  SOCHx:   reports that he has never smoked. He has never used smokeless  tobacco. He reports that he uses illicit drugs. He reports that he does not drink alcohol.  ALLERGIES:  Allergies  Allergen Reactions  . Shellfish Allergy Itching and Swelling    ROS: A comprehensive review of systems was negative except for: Constitutional: positive for fatigue Respiratory: positive for dyspnea on exertion Cardiovascular: positive for orthopnea HIV disease  HOME MEDS: Current Outpatient Prescriptions  Medication Sig Dispense Refill  . aspirin EC 325 MG tablet Take 325 mg by mouth daily.      . carvedilol (COREG) 12.5 MG tablet Take 1 tablet (12.5 mg total) by mouth 2 (two) times daily with a meal.  60 tablet  11  . clopidogrel (PLAVIX) 75 MG tablet Take 1 tablet (75 mg total) by mouth daily with breakfast.  30 tablet  11  . diphenhydrAMINE (BENADRYL) 25 mg capsule Take 25 mg by mouth 2 (two) times daily.       Marland Kitchen. efavirenz-emtricitabine-tenofovir (ATRIPLA) 600-200-300 MG per tablet Take 1 tablet by mouth at bedtime.  30 tablet  5  . ENSURE (ENSURE) Take 237 mLs by mouth 2 (two) times daily between meals.  237 mL  11  . gabapentin (NEURONTIN) 100 MG capsule Take 100 mg by mouth 3 (three) times daily.      Marland Kitchen. lisinopril (ZESTRIL) 10 MG tablet Take 0.5 tablets (5 mg total) by mouth daily.  30 tablet  6  . Multiple  Vitamin (MULTIVITAMIN WITH MINERALS) TABS tablet Take 1 tablet by mouth at bedtime.      . nitroGLYCERIN (NITROSTAT) 0.4 MG SL tablet Place 0.4 mg under the tongue every 5 (five) minutes as needed for chest pain.      . promethazine (PHENERGAN) 50 MG tablet Take 1 tablet (50 mg total) by mouth every 8 (eight) hours as needed for nausea or vomiting.  60 tablet  11  . rosuvastatin (CRESTOR) 40 MG tablet Take 1 tablet (40 mg total) by mouth at bedtime.  30 tablet  11   No current facility-administered medications for this visit.    LABS/IMAGING: No results found for this or any previous visit (from the past 48 hour(s)). No results found.  VITALS: BP 122/90   Pulse 66  Ht 6\' 1"  (1.854 m)  Wt 209 lb (94.802 kg)  BMI 27.58 kg/m2  EXAM: General appearance: alert and no distress Neck: no carotid bruit and no JVD Lungs: clear to auscultation bilaterally Heart: regular rate and rhythm, S1, S2 normal, no murmur, click, rub or gallop Abdomen: soft, non-tender; bowel sounds normal; no masses,  no organomegaly Extremities: extremities normal, atraumatic, no cyanosis or edema Pulses: 2+ and symmetric Skin: Skin color, texture, turgor normal. No rashes or lesions Neurologic: Grossly normal Psych: Mood, affect normal  EKG: Sinus rhythm with occasional PVCs at 66, anterolateral ST elevation with T-wave inversions laterally and inferior T wave inversions, suggestive of possible aneurysm  ASSESSMENT: 1. 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 Ischemic cardiomyopathy, EF 15% by patient report 3. PCI to the proximal LAD with a 5.0 x 18 mm Vision Multilink BMS in 2011 4. PCI x 2 to the mid-RCA with 2.5 x 16 mm and 2.5 x 20 mm Promus Premier DESs 5. Dyslipidemia 6. Peripheral neuropathy 7. Long-standing HIV disease-with undetectable viral load  PLAN: 1.   Edward Haley has significant coronary artery disease and by his report a large area of scar with low EF of 15%. I will need to obtain and review his coronary studies from his prior cardiologist. He does appear to be on appropriate medications, possibly withstanding the addition of Spironolactone, which if tolerated could be associated with a mortality benefit given his low EF.  He is on aspirin, Plavix, beta blocker, ACE inhibitor and statin.  He reports that he has had a discussion with his cardiologist in the past about placement of a defibrillator and was in favor of it, but did not schedule it.  However recommend a repeat echocardiogram at this time to reassess his ejection fraction.  If he continues to be less than 35%, I would recommend the placement of an  AICD for primary prevention of sudden cardiac death.  Plan for followup in 3 months and I will contact her with the results of his echocardiogram.  Edward Nose, MD, [facility] Attending Cardiologist [facility]  HILTY,Kenneth C 02/14/2014, 2:07 PM

## 2014-02-15 ENCOUNTER — Ambulatory Visit: Payer: Self-pay | Admitting: Internal Medicine

## 2014-02-28 ENCOUNTER — Ambulatory Visit (HOSPITAL_COMMUNITY)
Admission: RE | Admit: 2014-02-28 | Discharge: 2014-02-28 | Disposition: A | Discharge status: Home / Self Care | Payer: No Typology Code available for payment source | Source: Ambulatory Visit | Attending: Internal Medicine | Admitting: Internal Medicine

## 2014-02-28 ENCOUNTER — Ambulatory Visit (INDEPENDENT_AMBULATORY_CARE_PROVIDER_SITE_OTHER): Payer: No Typology Code available for payment source | Admitting: Internal Medicine

## 2014-02-28 ENCOUNTER — Encounter: Payer: Self-pay | Admitting: Internal Medicine

## 2014-02-28 VITALS — BP 143/102 | HR 72 | Temp 97.5°F | Wt 207.0 lb

## 2014-02-28 DIAGNOSIS — M25559 Pain in unspecified hip: Secondary | ICD-10-CM

## 2014-02-28 DIAGNOSIS — I259 Chronic ischemic heart disease, unspecified: Secondary | ICD-10-CM | POA: Insufficient documentation

## 2014-02-28 DIAGNOSIS — B20 Human immunodeficiency virus [HIV] disease: Secondary | ICD-10-CM

## 2014-02-28 DIAGNOSIS — R9431 Abnormal electrocardiogram [ECG] [EKG]: Secondary | ICD-10-CM

## 2014-02-28 DIAGNOSIS — I059 Rheumatic mitral valve disease, unspecified: Secondary | ICD-10-CM

## 2014-02-28 DIAGNOSIS — I2589 Other forms of chronic ischemic heart disease: Secondary | ICD-10-CM

## 2014-02-28 LAB — BASIC METABOLIC PANEL WITH GFR
BUN: 12 mg/dL (ref 6–23)
CHLORIDE: 104 meq/L (ref 96–112)
CO2: 30 mEq/L (ref 19–32)
CREATININE: 1.02 mg/dL (ref 0.50–1.35)
Calcium: 9.1 mg/dL (ref 8.4–10.5)
GFR, Est African American: >89 mL/min
GFR, Est Non African American: 87 mL/min
Glucose, Bld: 89 mg/dL (ref 70–99)
Potassium: 4.3 mEq/L (ref 3.5–5.3)
Sodium: 139 mEq/L (ref 135–145)

## 2014-02-28 LAB — CBC WITH DIFFERENTIAL/PLATELET
Basophils Absolute: 0 10*3/uL (ref 0.0–0.1)
Basophils Relative: 0 % (ref 0–1)
Eosinophils Absolute: 0.1 10*3/uL (ref 0.0–0.7)
Eosinophils Relative: 2 % (ref 0–5)
HCT: 42.6 % (ref 39.0–52.0)
HEMOGLOBIN: 14.8 g/dL (ref 13.0–17.0)
Lymphocytes Relative: 51 % — ABNORMAL HIGH (ref 12–46)
Lymphs Abs: 2.6 10*3/uL (ref 0.7–4.0)
MCH: 30.3 pg (ref 26.0–34.0)
MCHC: 34.7 g/dL (ref 30.0–36.0)
MCV: 87.1 fL (ref 78.0–100.0)
MONO ABS: 0.4 10*3/uL (ref 0.1–1.0)
MONOS PCT: 7 % (ref 3–12)
NEUTROS ABS: 2 10*3/uL (ref 1.7–7.7)
Neutrophils Relative %: 40 % — ABNORMAL LOW (ref 43–77)
Platelets: 151 10*3/uL (ref 150–400)
RBC: 4.89 MIL/uL (ref 4.22–5.81)
RDW: 15 % (ref 11.5–15.5)
WBC: 5 10*3/uL (ref 4.0–10.5)

## 2014-02-28 MED ORDER — OXYCODONE-ACETAMINOPHEN 7.5-325 MG PO TABS: 1.0000 | ORAL_TABLET | Freq: Three times a day (TID) | ORAL | Status: DC | PRN

## 2014-02-28 MED ORDER — METHOCARBAMOL 500 MG PO TABS: 500.0000 mg | ORAL_TABLET | Freq: Three times a day (TID) | ORAL | Status: DC | PRN

## 2014-02-28 NOTE — Progress Notes (Signed)
Subjective:    Patient ID: Edward Haley, male    DOB: [birth year], 48 y.o.   MRN: [id number]  HPI 48yo M, HIV disease, CD 4 count 780/VL<20 (in oct 2014), atripla. hiv dx in 1993. CAD s/p MI s/p PCI in 2011 s/p EF 15%. C/b shingles, syphilis (txd in [place]). He is here with his significant other and she reports she has never seen him in such pain in the last 5 years. He sustained a ground level fall leaving [place] yesterday. Fell to the right side injury right hip, back. He states that it is painful to lay flat on back. No weakness, no loss of bladder but having pain with having bowel movement.  Current Outpatient Prescriptions on File Prior to Visit  Medication Sig Dispense Refill  . aspirin EC 325 MG tablet Take 325 mg by mouth daily.      . carvedilol (COREG) 12.5 MG tablet Take 1 tablet (12.5 mg total) by mouth 2 (two) times daily with a meal.  60 tablet  11  . clopidogrel (PLAVIX) 75 MG tablet Take 1 tablet (75 mg total) by mouth daily with breakfast.  30 tablet  11  . diphenhydrAMINE (BENADRYL) 25 mg capsule Take 25 mg by mouth 2 (two) times daily.       Marland Kitchen efavirenz-emtricitabine-tenofovir (ATRIPLA) 600-200-300 MG per tablet Take 1 tablet by mouth at bedtime.  30 tablet  5  . gabapentin (NEURONTIN) 100 MG capsule Take 100 mg by mouth 3 (three) times daily.      Marland Kitchen lisinopril (ZESTRIL) 10 MG tablet Take 0.5 tablets (5 mg total) by mouth daily.  30 tablet  6  . Multiple Vitamin (MULTIVITAMIN WITH MINERALS) TABS tablet Take 1 tablet by mouth at bedtime.      . nitroGLYCERIN (NITROSTAT) 0.4 MG SL tablet Place 0.4 mg under the tongue every 5 (five) minutes as needed for chest pain.      . promethazine (PHENERGAN) 50 MG tablet Take 1 tablet (50 mg total) by mouth every 8 (eight) hours as needed for nausea or vomiting.  60 tablet  11  . rosuvastatin (CRESTOR) 40 MG tablet Take 1 tablet (40 mg total) by mouth at bedtime.  30 tablet  11   No current facility-administered medications on file prior to  visit.      Review of Systems Back and hip pain, and pain with defecation. No loss of bladder or bowel function. 10 point ROS otherwise negative    Objective:   Physical Exam BP 143/102  Pulse 72  Temp(Src) 97.5 F (36.4 C) (Oral)  Wt 207 lb (93.895 kg) Physical Exam  Constitutional: He is oriented to person, place, and time. He appears well-developed and well-nourished. No distress.  HENT:  Mouth/Throat: Oropharynx is clear and moist. No oropharyngeal exudate.  Cardiovascular: Normal rate, regular rhythm and normal heart sounds. Exam reveals no gallop and no friction rub.  No murmur heard.  Pulmonary/Chest: Effort normal and breath sounds normal. No respiratory distress. He has no wheezes.  Abdominal: Soft. Bowel sounds are normal. He exhibits no distension. There is no tenderness.  Lymphadenopathy:  He has no cervical adenopathy.  Neurological: He is alert and oriented to person, place, and time. No weakness but tenderness to right hip Skin: Skin is warm and dry. No rash noted. No erythema.  Psychiatric: He has a normal mood and affect. His behavior is normal.          Assessment & Plan:  HIV= will get labs  today, continue with atripla  Cardiomyopathy = getting defibrillator in may. Being evaluated by cardiology  Right hip pain/ back pain= start plain films of back and hip. Give pain meds and robaxin   rtc in 2 wk

## 2014-02-28 NOTE — Progress Notes (Signed)
2D Echo Performed 02/28/2014    Eivan Gallina, RCS  

## 2014-03-01 LAB — T-HELPER CELL (CD4) - (RCID CLINIC ONLY)
CD4 T CELL ABS: 1000 /uL (ref 400–2700)
CD4 T CELL HELPER: 38 % (ref 33–55)

## 2014-03-02 LAB — HIV-1 RNA QUANT-NO REFLEX-BLD: HIV 1 RNA Quant: <20 copies/mL (ref <20)

## 2014-03-03 ENCOUNTER — Ambulatory Visit (INDEPENDENT_AMBULATORY_CARE_PROVIDER_SITE_OTHER): Payer: No Typology Code available for payment source | Admitting: Cardiovascular Disease

## 2014-03-03 ENCOUNTER — Encounter: Payer: Self-pay | Admitting: Cardiovascular Disease

## 2014-03-03 VITALS — BP 96/64 | HR 74 | Resp 20 | Ht 73.0 in | Wt 212.8 lb

## 2014-03-03 DIAGNOSIS — Z79899 Other long term (current) drug therapy: Secondary | ICD-10-CM

## 2014-03-03 DIAGNOSIS — D689 Coagulation defect, unspecified: Secondary | ICD-10-CM

## 2014-03-03 DIAGNOSIS — I2589 Other forms of chronic ischemic heart disease: Secondary | ICD-10-CM

## 2014-03-03 DIAGNOSIS — I255 Ischemic cardiomyopathy: Secondary | ICD-10-CM

## 2014-03-03 DIAGNOSIS — I2510 Atherosclerotic heart disease of native coronary artery without angina pectoris: Secondary | ICD-10-CM

## 2014-03-03 DIAGNOSIS — R5383 Other fatigue: Secondary | ICD-10-CM

## 2014-03-03 DIAGNOSIS — R5381 Other malaise: Secondary | ICD-10-CM

## 2014-03-03 NOTE — Progress Notes (Signed)
Patient ID: Edward Haley, male   DOB: Jan 07, 1966, 48 y.o.   MRN: [id number]      Reason for office visit AICD implantation referral  Edward Haley is a pleasant 48 year old male with an extensive past medical history. Unfortunately he has a history of HIV disease but has a undetectable viral load. He's been on almost every antiviral medication over the years since his diagnosis which I believe was in the 1980s or 1990s. This is a history of incarceration for 17 years. He subsequently developed coronary artery disease and does have a strong family history of this. In 2011 he had his first MI which was a major anterior MI. He had placement of a vision 5.0 x 18 MM MultiLink bare metal stent to the LAD and February 2011 at [facility] in [place]. Subsequently, he developed recurrent coronary disease and had 2 stents placed in the right coronary artery in March of 2014 in [place]. These were Promus Premier drug-eluting stents (2.5 x 16 mm and 2.5 x 20 mm), both to the RCA. He has mentioned that on nuclear stress testing he was told to have an EF of 15%, with extensive scar. Most recently an echocardiogram performed in our office on March 2 shows a left ventricular ejection fraction of 25-30%.  He is on appropriate comprehensive treatment for congestive heart failure with relatively high dose of carvedilol and ACE inhibitor. He is unable to tolerate higher doses of medications due to relative hypotension. He has good functional status (NYHA class II) and is working at [employer]. He does not have angina pectoris. He has never experienced syncope or documented sustained ventricular tachycardia, that I am aware of.    Allergies  Allergen Reactions  . Shellfish Allergy Itching and Swelling    Current Outpatient Prescriptions  Medication Sig Dispense Refill  . aspirin EC 325 MG tablet Take 325 mg by mouth daily.      . carvedilol (COREG) 12.5 MG tablet Take 1 tablet  (12.5 mg total) by mouth 2 (two) times daily with a meal.  60 tablet  11  . clopidogrel (PLAVIX) 75 MG tablet Take 1 tablet (75 mg total) by mouth daily with breakfast.  30 tablet  11  . diphenhydrAMINE (BENADRYL) 25 mg capsule Take 25 mg by mouth 2 (two) times daily.       Marland Kitchen efavirenz-emtricitabine-tenofovir (ATRIPLA) 600-200-300 MG per tablet Take 1 tablet by mouth at bedtime.  30 tablet  5  . ENSURE PLUS (ENSURE PLUS) LIQD Take 237 mLs by mouth 2 (two) times daily between meals.      . gabapentin (NEURONTIN) 100 MG capsule Take 100 mg by mouth 3 (three) times daily.      Marland Kitchen lisinopril (ZESTRIL) 10 MG tablet Take 0.5 tablets (5 mg total) by mouth daily.  30 tablet  6  . methocarbamol (ROBAXIN) 500 MG tablet Take 1 tablet (500 mg total) by mouth every 8 (eight) hours as needed for muscle spasms.  30 tablet  0  . Multiple Vitamin (MULTIVITAMIN WITH MINERALS) TABS tablet Take 1 tablet by mouth at bedtime.      . nitroGLYCERIN (NITROSTAT) 0.4 MG SL tablet Place 0.4 mg under the tongue every 5 (five) minutes as needed for chest pain.      Marland Kitchen oxyCODONE-acetaminophen (PERCOCET) 7.5-325 MG per tablet Take 1 tablet by mouth every 8 (eight) hours as needed for pain.  30 tablet  0  . promethazine (PHENERGAN) 50 MG tablet Take 1  tablet (50 mg total) by mouth every 8 (eight) hours as needed for nausea or vomiting.  60 tablet  11  . rosuvastatin (CRESTOR) 40 MG tablet Take 1 tablet (40 mg total) by mouth at bedtime.  30 tablet  11  . tadalafil (CIALIS) 20 MG tablet Take 20 mg by mouth daily as needed for erectile dysfunction.       No current facility-administered medications for this visit.    Past Medical History  Diagnosis Date  . HIV infection   . MI (myocardial infarction)   . Hypertension   . Coronary artery disease   . Hyperlipidemia     Past Surgical History  Procedure Laterality Date  . Coronary angioplasty with stent placement  03/11/2013    [facility]  [place] - Promus Premiere Monorail 2.5x5416mm to RCA & 2.5x3320mm to RCA  . Coronary angioplasty with stent placement  02/11/2010    [facility] [place] [place] - Multi-Link 5.0x8318mm to LAD    No family history on file.  History   Social History  . Marital Status: Single    Spouse Name: N/A    Number of Children: N/A  . Years of Education: N/A   Occupational History  . Not on file.   Social History Main Topics  . Smoking status: Never Smoker   . Smokeless tobacco: Never Used  . Alcohol Use: No  . Drug Use: Yes     Comment: medicinal  . Sexual Activity: Yes    Partners: Female   Other Topics Concern  . Not on file   Social History Narrative  . No narrative on file    Review of systems: The patient specifically denies any chest pain at rest or with exertion, dyspnea at rest, orthopnea, paroxysmal nocturnal dyspnea, syncope, palpitations, focal neurological deficits, intermittent claudication, lower extremity edema, unexplained weight gain, cough, hemoptysis or wheezing.  The patient also denies abdominal pain, nausea, vomiting, dysphagia, diarrhea, constipation, polyuria, polydipsia, dysuria, hematuria, frequency, urgency, abnormal bleeding or bruising, fever, chills, unexpected weight changes, mood swings, change in skin or hair texture, change in voice quality, auditory or visual problems, allergic reactions or rashes, new musculoskeletal complaints other than usual "aches and pains".   PHYSICAL EXAM BP 96/64  Pulse 74  Resp 20  Ht 6\' 1"  (1.854 m)  Wt 96.525 kg (212 lb 12.8 oz)  BMI 28.08 kg/m2  General: Alert, oriented x3, no distress Head: no evidence of trauma, PERRL, EOMI, no exophtalmos or lid lag, no myxedema, no xanthelasma; normal ears, nose and oropharynx Neck: normal jugular venous pulsations and no hepatojugular reflux; brisk carotid pulses without delay and no carotid bruits Chest: clear to auscultation, no signs of consolidation by percussion or  palpation, normal fremitus, symmetrical and full respiratory excursions Cardiovascular: normal position and quality of the apical impulse, regular rhythm, normal first and second heart sounds, no murmurs, rubs or gallops Abdomen: no tenderness or distention, no masses by palpation, no abnormal pulsatility or arterial bruits, normal bowel sounds, no hepatosplenomegaly Extremities: no clubbing, cyanosis or edema; 2+ radial, ulnar and brachial pulses bilaterally; 2+ right femoral, posterior tibial and dorsalis pedis pulses; 2+ left femoral, posterior tibial and dorsalis pedis pulses; no subclavian or femoral bruits Neurological: grossly nonfocal   EKG: Sinus rhythm, left axis deviation, extensive anterior Q waves, ST segment elevation and inverted T waves in a "frozen infarct pattern".  Lipid Panel  No results found for this basename: chol, trig, hdl, cholhdl, vldl, ldlcalc  BMET    Component Value Date/Time   NA 139 02/28/2014 1104   K 4.3 02/28/2014 1104   CL 104 02/28/2014 1104   CO2 30 02/28/2014 1104   GLUCOSE 89 02/28/2014 1104   BUN 12 02/28/2014 1104   CREATININE 1.02 02/28/2014 1104   CALCIUM 9.1 02/28/2014 1104     ASSESSMENT AND PLAN Mr. Jakubik clearly meets criteria for primary prevention ICD implantation for ischemic cardiomyopathy (Prior myocardial infarction, left ventricular ejection fraction under 35%, heart failure NYHA class II-III, on comprehensive medical therapy). Discussed the purpose of defibrillators, their mechanism of action, details of the implantation procedure, long-term followup and possible complications. This procedure has been fully reviewed with the patient and written informed consent has been obtained.   Orders Placed This Encounter  Procedures  . Protime-INR  . APTT  . Comprehensive metabolic panel  . CBC  . EKG 12-Lead  . IMPLANTABLE CARDIOVERTER DEFIBRILLATOR IMPLANT   Meds ordered this encounter  Medications  . ENSURE PLUS (ENSURE PLUS) LIQD     Sig: Take 237 mLs by mouth 2 (two) times daily between meals.  . tadalafil (CIALIS) 20 MG tablet    Sig: Take 20 mg by mouth daily as needed for erectile dysfunction.    Junious Silk, MD, [facility] [phone] office [phone] pager

## 2014-03-03 NOTE — Patient Instructions (Signed)
Your physician has recommended that you have a defibrillator inserted. An implantable cardioverter defibrillator (ICD) is a small device that is placed in your chest or, in rare cases, your abdomen. This device uses electrical pulses or shocks to help control life-threatening, irregular heartbeats that could lead the heart to suddenly stop beating (sudden cardiac arrest). Leads are attached to the ICD that goes into your heart. This is done in the hospital and usually requires an overnight stay. Please see the instruction sheet given to you today for more information.  Your physician recommends that you return for lab work in:   4-5 days prior to the procedure.

## 2014-03-07 ENCOUNTER — Other Ambulatory Visit: Payer: Self-pay | Admitting: *Deleted

## 2014-03-07 DIAGNOSIS — B20 Human immunodeficiency virus [HIV] disease: Secondary | ICD-10-CM

## 2014-03-11 ENCOUNTER — Ambulatory Visit: Payer: No Typology Code available for payment source | Attending: Internal Medicine | Admitting: Internal Medicine

## 2014-03-11 ENCOUNTER — Encounter: Payer: Self-pay | Admitting: Internal Medicine

## 2014-03-11 VITALS — BP 130/89 | HR 78 | Temp 98.2°F | Resp 16 | Ht 73.0 in | Wt 210.0 lb

## 2014-03-11 DIAGNOSIS — Z21 Asymptomatic human immunodeficiency virus [HIV] infection status: Secondary | ICD-10-CM | POA: Insufficient documentation

## 2014-03-11 DIAGNOSIS — Z760 Encounter for issue of repeat prescription: Secondary | ICD-10-CM

## 2014-03-11 DIAGNOSIS — B20 Human immunodeficiency virus [HIV] disease: Secondary | ICD-10-CM

## 2014-03-11 DIAGNOSIS — Z95810 Presence of automatic (implantable) cardiac defibrillator: Secondary | ICD-10-CM | POA: Insufficient documentation

## 2014-03-11 DIAGNOSIS — I2510 Atherosclerotic heart disease of native coronary artery without angina pectoris: Secondary | ICD-10-CM

## 2014-03-11 DIAGNOSIS — N529 Male erectile dysfunction, unspecified: Secondary | ICD-10-CM

## 2014-03-11 DIAGNOSIS — M62830 Muscle spasm of back: Secondary | ICD-10-CM

## 2014-03-11 DIAGNOSIS — I2589 Other forms of chronic ischemic heart disease: Secondary | ICD-10-CM | POA: Insufficient documentation

## 2014-03-11 DIAGNOSIS — M545 Low back pain, unspecified: Secondary | ICD-10-CM

## 2014-03-11 DIAGNOSIS — I252 Old myocardial infarction: Secondary | ICD-10-CM | POA: Insufficient documentation

## 2014-03-11 DIAGNOSIS — E785 Hyperlipidemia, unspecified: Secondary | ICD-10-CM | POA: Insufficient documentation

## 2014-03-11 DIAGNOSIS — G8929 Other chronic pain: Secondary | ICD-10-CM

## 2014-03-11 DIAGNOSIS — M25559 Pain in unspecified hip: Secondary | ICD-10-CM

## 2014-03-11 DIAGNOSIS — M5380 Other specified dorsopathies, site unspecified: Secondary | ICD-10-CM

## 2014-03-11 DIAGNOSIS — Z7902 Long term (current) use of antithrombotics/antiplatelets: Secondary | ICD-10-CM | POA: Insufficient documentation

## 2014-03-11 DIAGNOSIS — Z139 Encounter for screening, unspecified: Secondary | ICD-10-CM

## 2014-03-11 LAB — TSH: TSH: 0.849 u[IU]/mL (ref 0.350–4.500)

## 2014-03-11 MED ORDER — METHOCARBAMOL 500 MG PO TABS: 500.0000 mg | ORAL_TABLET | Freq: Every evening | ORAL | Status: DC | PRN

## 2014-03-11 MED ORDER — TADALAFIL 20 MG PO TABS
20.0000 mg | ORAL_TABLET | Freq: Every day | ORAL | Status: DC | PRN
Start: 2014-03-11 — End: 2014-03-14

## 2014-03-11 MED ORDER — TRAMADOL HCL 50 MG PO TABS: 50.0000 mg | ORAL_TABLET | Freq: Three times a day (TID) | ORAL | Status: DC | PRN

## 2014-03-11 MED ORDER — DRONABINOL 2.5 MG PO CAPS: 2.5000 mg | ORAL_CAPSULE | Freq: Two times a day (BID) | ORAL | Status: DC

## 2014-03-11 NOTE — Progress Notes (Signed)
Pt is here to establish care. 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

## 2014-03-11 NOTE — Progress Notes (Signed)
Patient Demographics  Edward Haley, is a 48 y.o. male  ZOX:[id number]  WJX:[id number]  DOB - 1966-02-01  CC:  Chief Complaint  Patient presents with  . Establish Care       HPI: Edward SheerJohn Hanselman is a 48 y.o. male here today to establish medical care. Patient has history of CAD ischemic cardiomyopathy scheduled to get ICD in place, following up with the cardiologist, patient has history of HIV following up with her infectious disease on HIV medication, patient denies any chest pain or shortness of breath, history of erectile dysfunction, is requesting refill on his medication., Patient also reported to have chronic lower back pain right hip pain, several years ago he fell down he has been prescribed narcotic medication and Robaxin by his infectious disease specialist, requesting pain medication prescription. Patient has No headache, No chest pain, No abdominal pain - No Nausea, No new weakness tingling or numbness, No Cough - SOB.  Allergies  Allergen Reactions  . Shellfish Allergy Itching and Swelling   Past Medical History  Diagnosis Date  . HIV infection   . MI (myocardial infarction)   . Hypertension   . Coronary artery disease   . Hyperlipidemia    Current Outpatient Prescriptions on File Prior to Visit  Medication Sig Dispense Refill  . aspirin EC 325 MG tablet Take 325 mg by mouth daily.      . carvedilol (COREG) 12.5 MG tablet Take 1 tablet (12.5 mg total) by mouth 2 (two) times daily with a meal.  60 tablet  11  . clopidogrel (PLAVIX) 75 MG tablet Take 1 tablet (75 mg total) by mouth daily with breakfast.  30 tablet  11  . diphenhydrAMINE (BENADRYL) 25 mg capsule Take 25 mg by mouth 2 (two) times daily.       Marland Kitchen. efavirenz-emtricitabine-tenofovir (ATRIPLA) 600-200-300 MG per tablet Take 1 tablet by mouth at bedtime.  30 tablet  5  . ENSURE PLUS (ENSURE PLUS) LIQD Take 237 mLs by mouth 2 (two) times daily between meals.      . gabapentin (NEURONTIN) 100 MG capsule Take  100 mg by mouth 3 (three) times daily.      Marland Kitchen. lisinopril (ZESTRIL) 10 MG tablet Take 0.5 tablets (5 mg total) by mouth daily.  30 tablet  6  . Multiple Vitamin (MULTIVITAMIN WITH MINERALS) TABS tablet Take 1 tablet by mouth at bedtime.      . nitroGLYCERIN (NITROSTAT) 0.4 MG SL tablet Place 0.4 mg under the tongue every 5 (five) minutes as needed for chest pain.      Marland Kitchen. oxyCODONE-acetaminophen (PERCOCET) 7.5-325 MG per tablet Take 1 tablet by mouth every 8 (eight) hours as needed for pain.  30 tablet  0  . promethazine (PHENERGAN) 50 MG tablet Take 1 tablet (50 mg total) by mouth every 8 (eight) hours as needed for nausea or vomiting.  60 tablet  11  . rosuvastatin (CRESTOR) 40 MG tablet Take 1 tablet (40 mg total) by mouth at bedtime.  30 tablet  11   No current facility-administered medications on file prior to visit.   Family History  Problem Relation Age of Onset  . Stroke Mother   . Hypertension Mother   . Diabetes Mother   . Heart disease Sister   . Cancer Sister    History   Social History  . Marital Status: Single    Spouse Name: N/A    Number of Children: N/A  . Years of Education: N/A  Occupational History  . Not on file.   Social History Main Topics  . Smoking status: Never Smoker   . Smokeless tobacco: Never Used  . Alcohol Use: No  . Drug Use: Yes    Special: Marijuana     Comment: medicinal  . Sexual Activity: Yes    Partners: Female   Other Topics Concern  . Not on file   Social History Narrative  . No narrative on file    Review of Systems: Constitutional: Negative for fever, chills, diaphoresis, activity change, appetite change and fatigue. HENT: Negative for ear pain, nosebleeds, congestion, facial swelling, rhinorrhea, neck pain, neck stiffness and ear discharge.  Eyes: Negative for pain, discharge, redness, itching and visual disturbance. Respiratory: Negative for cough, choking, chest tightness, shortness of breath, wheezing and stridor.    Cardiovascular: Negative for chest pain, palpitations and leg swelling. Gastrointestinal: Negative for abdominal distention. Genitourinary: Negative for dysuria, urgency, frequency, hematuria, flank pain, decreased urine volume, difficulty urinating and dyspareunia.  Musculoskeletal: Negative for back pain, joint swelling, +arthralgia and gait problem. Neurological: Negative for dizziness, tremors, seizures, syncope, facial asymmetry, speech difficulty, weakness, light-headedness, numbness and headaches.  Hematological: Negative for adenopathy. Does not bruise/bleed easily. Psychiatric/Behavioral: Negative for hallucinations, behavioral problems, confusion, dysphoric mood, decreased concentration and agitation.    Objective:   Filed Vitals:   03/11/14 0924  BP: 130/89  Pulse: 78  Temp: 98.2 F (36.8 C)  Resp: 16    Physical Exam: Constitutional: Patient appears well-developed and well-nourished. No distress. HENT: Normocephalic, atraumatic, External right and left ear normal. Oropharynx is clear and moist.  Eyes: Conjunctivae and EOM are normal. PERRLA, no scleral icterus. Neck: Normal ROM. Neck supple. No JVD. No tracheal deviation. No thyromegaly. CVS: RRR, S1/S2 +, no murmurs, no gallops, no carotid bruit.  Pulmonary: Effort and breath sounds normal, no stridor, rhonchi, wheezes, rales.  Abdominal: Soft. BS +, no distension, tenderness, rebound or guarding.  Musculoskeletal: Low lumbar spinal and paraspinal tenderness, with SLR test patient complaining of back discomfort.. Tenderness on the right hip  anteriorly  Neuro: Alert. Normal reflexes, muscle tone coordination. No cranial nerve deficit. Skin: Skin is warm and dry. No rash noted. Not diaphoretic. No erythema. No pallor. Psychiatric: Normal mood and affect. Behavior, judgment, thought content normal.  Lab Results  Component Value Date   WBC 5.0 02/28/2014   HGB 14.8 02/28/2014   HCT 42.6 02/28/2014   MCV 87.1 02/28/2014    PLT 151 02/28/2014   Lab Results  Component Value Date   CREATININE 1.02 02/28/2014   BUN 12 02/28/2014   NA 139 02/28/2014   K 4.3 02/28/2014   CL 104 02/28/2014   CO2 30 02/28/2014    No results found for this basename: HGBA1C   Lipid Panel  No results found for this basename: chol, trig, hdl, cholhdl, vldl, ldlcalc       Assessment and plan:   1. CAD (coronary artery disease) Continue with current medications following up with cardiologist.  2. Human immunodeficiency virus (HIV) disease Following up with ID on meds  3. Chronic low back pain  - Ambulatory referral to Orthopedic Surgery  4. Back muscle spasm Robaxin refill done.   5. Hip pain  - methocarbamol (ROBAXIN) 500 MG tablet; Take 1 tablet (500 mg total) by mouth at bedtime as needed for muscle spasms.  Dispense: 30 tablet; Refill: 1 - traMADol (ULTRAM) 50 MG tablet; Take 1 tablet (50 mg total) by mouth every 8 (eight) hours as needed  for moderate pain.  Dispense: 30 tablet; Refill: 0 - Ambulatory referral to Orthopedic Surgery  6. Medication refill  - dronabinol (MARINOL) 2.5 MG capsule; Take 1 capsule (2.5 mg total) by mouth 2 (two) times daily before a meal.  Dispense: 60 capsule; Refill: 3 - tadalafil (CIALIS) 20 MG tablet; Take 1 tablet (20 mg total) by mouth daily as needed for erectile dysfunction.  Dispense: 10 tablet; Refill: 0  7. Screening  - TSH - Vit D  25 hydroxy (rtn osteoporosis monitoring)  8. ED (erectile dysfunction) Has been Taking Cialis refill done    Return in about 3 months (around 06/11/2014) for back pain.   [name], MD

## 2014-03-12 LAB — VITAMIN D 25 HYDROXY (VIT D DEFICIENCY, FRACTURES): Vit D, 25-Hydroxy: 19 ng/mL — ABNORMAL LOW (ref 30–89)

## 2014-03-14 ENCOUNTER — Other Ambulatory Visit: Payer: Self-pay

## 2014-03-14 ENCOUNTER — Ambulatory Visit: Payer: No Typology Code available for payment source

## 2014-03-14 ENCOUNTER — Other Ambulatory Visit: Payer: Self-pay | Admitting: *Deleted

## 2014-03-14 ENCOUNTER — Encounter: Payer: Self-pay | Admitting: *Deleted

## 2014-03-14 DIAGNOSIS — B20 Human immunodeficiency virus [HIV] disease: Secondary | ICD-10-CM

## 2014-03-14 DIAGNOSIS — Z760 Encounter for issue of repeat prescription: Secondary | ICD-10-CM

## 2014-03-14 MED ORDER — TADALAFIL 20 MG PO TABS: 20.0000 mg | ORAL_TABLET | Freq: Every day | ORAL | Status: DC | PRN

## 2014-03-14 MED ORDER — EFAVIRENZ-EMTRICITAB-TENOFOVIR 600-200-300 MG PO TABS: 1.0000 | ORAL_TABLET | Freq: Every day | ORAL | Status: DC

## 2014-03-14 MED ORDER — DRONABINOL 2.5 MG PO CAPS: 2.5000 mg | ORAL_CAPSULE | Freq: Two times a day (BID) | ORAL | Status: DC

## 2014-03-15 ENCOUNTER — Other Ambulatory Visit: Payer: Self-pay | Admitting: Internal Medicine

## 2014-03-15 ENCOUNTER — Telehealth: Payer: Self-pay | Admitting: *Deleted

## 2014-03-15 ENCOUNTER — Telehealth: Payer: Self-pay

## 2014-03-15 ENCOUNTER — Ambulatory Visit (INDEPENDENT_AMBULATORY_CARE_PROVIDER_SITE_OTHER): Payer: No Typology Code available for payment source | Admitting: Internal Medicine

## 2014-03-15 ENCOUNTER — Encounter: Payer: Self-pay | Admitting: Internal Medicine

## 2014-03-15 VITALS — BP 103/68 | HR 80 | Temp 97.8°F | Wt 209.0 lb

## 2014-03-15 DIAGNOSIS — I259 Chronic ischemic heart disease, unspecified: Secondary | ICD-10-CM

## 2014-03-15 DIAGNOSIS — B20 Human immunodeficiency virus [HIV] disease: Secondary | ICD-10-CM

## 2014-03-15 MED ORDER — VITAMIN D (ERGOCALCIFEROL) 1.25 MG (50000 UNIT) PO CAPS: 50000.0000 [IU] | ORAL_CAPSULE | ORAL | Status: AC

## 2014-03-15 MED ORDER — ROSUVASTATIN CALCIUM 40 MG PO TABS: 40.0000 mg | ORAL_TABLET | Freq: Every day | ORAL | Status: AC

## 2014-03-15 NOTE — Telephone Encounter (Signed)
Left message 1st attempt.  

## 2014-03-15 NOTE — Telephone Encounter (Signed)
Message copied by Lestine Mount on Tue Mar 15, 2014  2:04 PM ------      Message from: [name]      Created: Mon Mar 14, 2014  9:06 AM       Blood work reviewed, noticed low vitamin D, call patient advise to start ergocalciferol 50,000 units once a week for the duration of  12 weeks.       ------

## 2014-03-15 NOTE — Progress Notes (Signed)
Subjective:    Patient ID: Edward Haley, male    DOB: Sep 17, 1966, 48 y.o.   MRN: [id number]  HPI 48yo M, HIV disease, CD 4 count 780/VL<20 (in oct 2014), atripla. hiv dx in 1993. CAD s/p MI s/p PCI in 2011c/b ICM with EF 15%. C/b shingles, syphilis (txd in [place]). He is here with his significant other and she reports she has never seen him in such pain in the last 5 years. He sustained a ground level fall leaving wendys roughly 2 wks ago [place] to the right side injury right hip, back. He states that it is painful to lay flat on back. No weakness, no loss of bladder but having pain with having bowel movement. We saw him on 3/2 and had xrays of lumbar spine and hip which were negative for fracture. He was given muscle relaxant and pain medications.  Current Outpatient Prescriptions on File Prior to Visit  Medication Sig Dispense Refill  . aspirin EC 325 MG tablet Take 325 mg by mouth daily.      . carvedilol (COREG) 12.5 MG tablet Take 1 tablet (12.5 mg total) by mouth 2 (two) times daily with a meal.  60 tablet  11  . clopidogrel (PLAVIX) 75 MG tablet Take 1 tablet (75 mg total) by mouth daily with breakfast.  30 tablet  11  . diphenhydrAMINE (BENADRYL) 25 mg capsule Take 25 mg by mouth 2 (two) times daily.       Marland Kitchen. dronabinol (MARINOL) 2.5 MG capsule Take 1 capsule (2.5 mg total) by mouth 2 (two) times daily before a meal.  60 capsule  5  . efavirenz-emtricitabine-tenofovir (ATRIPLA) 600-200-300 MG per tablet Take 1 tablet by mouth at bedtime.  30 tablet  5  . ENSURE PLUS (ENSURE PLUS) LIQD Take 237 mLs by mouth 2 (two) times daily between meals.      . gabapentin (NEURONTIN) 100 MG capsule Take 100 mg by mouth 3 (three) times daily.      Marland Kitchen. lisinopril (ZESTRIL) 10 MG tablet Take 0.5 tablets (5 mg total) by mouth daily.  30 tablet  6  . methocarbamol (ROBAXIN) 500 MG tablet Take 1 tablet (500 mg total) by mouth at bedtime as needed for muscle spasms.  30 tablet  1  . Multiple Vitamin (MULTIVITAMIN  WITH MINERALS) TABS tablet Take 1 tablet by mouth at bedtime.      . nitroGLYCERIN (NITROSTAT) 0.4 MG SL tablet Place 0.4 mg under the tongue every 5 (five) minutes as needed for chest pain.      Marland Kitchen. oxyCODONE-acetaminophen (PERCOCET) 7.5-325 MG per tablet Take 1 tablet by mouth every 8 (eight) hours as needed for pain.  30 tablet  0  . promethazine (PHENERGAN) 50 MG tablet Take 1 tablet (50 mg total) by mouth every 8 (eight) hours as needed for nausea or vomiting.  60 tablet  11  . rosuvastatin (CRESTOR) 40 MG tablet Take 1 tablet (40 mg total) by mouth at bedtime.  30 tablet  11  . tadalafil (CIALIS) 20 MG tablet Take 1 tablet (20 mg total) by mouth daily as needed for erectile dysfunction.  12 tablet  2  . traMADol (ULTRAM) 50 MG tablet Take 1 tablet (50 mg total) by mouth every 8 (eight) hours as needed for moderate pain.  30 tablet  0   No current facility-administered medications on file prior to visit.   Active Ambulatory Problems    Diagnosis Date Noted  . Human immunodeficiency virus (HIV) disease  10/13/2013  . Viral syndrome 12/10/2013  . CAD (coronary artery disease) 02/14/2014  . Ischemic cardiomyopathy 02/14/2014  . 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 Old anterior myocardial infarction 02/14/2014  . ED (erectile dysfunction) 03/11/2014  . Hip pain 03/11/2014  . Chronic low back pain 03/11/2014   Resolved Ambulatory Problems    Diagnosis Date Noted  . No Resolved Ambulatory Problems   Past Medical History  Diagnosis Date  . HIV infection   . MI (myocardial infarction)   . Hypertension   . Coronary artery disease   . Hyperlipidemia       Review of Systems 10 point ros is negative except    Objective:   Physical Exam BP 103/68  Pulse 80  Temp(Src) 97.8 F (36.6 C) (Oral)  Wt 209 lb (94.802 kg) Physical Exam  Constitutional: He is oriented to person, place, and time. He appears well-developed and well-nourished. No distress.    Neurological: full range of motion of both legs  Skin: Skin is warm and dry. No rash noted. No erythema.  Psychiatric: He has a normal mood and affect. His behavior is normal.        Assessment & Plan:  msk pain from ground level fall = improving. Continue with tramadol and muscle relaxant.  hiv = continue with atripla  Health maintenance = will check hep a and hep b status

## 2014-03-15 NOTE — Telephone Encounter (Signed)
Message copied by Raynelle Chary on Tue Mar 15, 2014  9:13 AM ------      Message from: [name]      Created: Mon Mar 14, 2014  9:06 AM       Blood work reviewed, noticed low vitamin D, call patient advise to start ergocalciferol 50,000 units once a week for the duration of  12 weeks.       ------

## 2014-03-15 NOTE — Telephone Encounter (Signed)
Patient not available  Left message on machine to return our call 

## 2014-03-16 ENCOUNTER — Telehealth: Payer: Self-pay | Admitting: Emergency Medicine

## 2014-03-16 LAB — HEPATITIS B SURFACE ANTIBODY,QUALITATIVE: Hep B S Ab: POSITIVE — AB

## 2014-03-16 LAB — HEPATITIS B SURFACE ANTIGEN: HEP B S AG: NEGATIVE

## 2014-03-16 LAB — HEPATITIS A ANTIBODY, TOTAL: Hep A Total Ab: REACTIVE — AB

## 2014-03-16 LAB — HEPATITIS C ANTIBODY: HCV AB: NEGATIVE

## 2014-03-16 NOTE — Telephone Encounter (Signed)
Message copied by Darlis Loan on Wed Mar 16, 2014  5:55 PM ------      Message from: [name]      Created: Mon Mar 14, 2014  9:06 AM       Blood work reviewed, noticed low vitamin D, call patient advise to start ergocalciferol 50,000 units once a week for the duration of  12 weeks.       ------

## 2014-03-16 NOTE — Telephone Encounter (Signed)
Message copied by Darlis Loan on Wed Mar 16, 2014  5:52 PM ------      Message from: [name]      Created: Mon Mar 14, 2014  9:06 AM       Blood work reviewed, noticed low vitamin D, call patient advise to start ergocalciferol 50,000 units once a week for the duration of  12 weeks.       ------

## 2014-03-16 NOTE — Telephone Encounter (Signed)
Left message for pt to call clinic when message received 

## 2014-03-17 ENCOUNTER — Other Ambulatory Visit: Payer: Self-pay

## 2014-03-17 DIAGNOSIS — Z760 Encounter for issue of repeat prescription: Secondary | ICD-10-CM

## 2014-03-17 MED ORDER — TADALAFIL 20 MG PO TABS: 20.0000 mg | ORAL_TABLET | Freq: Every day | ORAL | Status: DC | PRN

## 2014-04-13 ENCOUNTER — Encounter (HOSPITAL_COMMUNITY): Payer: Self-pay | Admitting: Pharmacy Technician

## 2014-04-14 ENCOUNTER — Encounter: Payer: Self-pay | Admitting: Cardiovascular Disease

## 2014-04-14 ENCOUNTER — Other Ambulatory Visit: Payer: Self-pay | Admitting: *Deleted

## 2014-04-14 DIAGNOSIS — I255 Ischemic cardiomyopathy: Secondary | ICD-10-CM

## 2014-04-19 ENCOUNTER — Other Ambulatory Visit: Payer: Self-pay

## 2014-04-19 DIAGNOSIS — Z760 Encounter for issue of repeat prescription: Secondary | ICD-10-CM

## 2014-04-19 MED ORDER — DRONABINOL 2.5 MG PO CAPS: 2.5000 mg | ORAL_CAPSULE | Freq: Two times a day (BID) | ORAL | Status: DC

## 2014-04-21 ENCOUNTER — Telehealth: Payer: Self-pay | Admitting: Cardiovascular Disease

## 2014-04-21 NOTE — Telephone Encounter (Signed)
Would like to know if he is suppose to have lab work done before he has the ICD placed on Tues 04/26/14.. By Dr.Croitoru .Marland Kitchen

## 2014-04-21 NOTE — Telephone Encounter (Signed)
Spoke to patient. He states he went to [facility] they said he did not need labs. RN informed patient that he did need labs prior to procedure. He states he will be able to do labs tomorrow. RN informed him to take his lab slip that he received on 3/5//15. If patient can not locate them this afternoon when he gets home, call back to get a copy of the lab request. Patient verbalized understanding

## 2014-04-22 ENCOUNTER — Telehealth: Payer: Self-pay | Admitting: Cardiovascular Disease

## 2014-04-22 LAB — COMPREHENSIVE METABOLIC PANEL
ALT: 17 U/L (ref 0–53)
AST: 22 U/L (ref 0–37)
Albumin: 4.5 g/dL (ref 3.5–5.2)
Alkaline Phosphatase: 57 U/L (ref 39–117)
BILIRUBIN TOTAL: 0.3 mg/dL (ref 0.2–1.2)
BUN: 9 mg/dL (ref 6–23)
CO2: 26 mEq/L (ref 19–32)
Calcium: 9.3 mg/dL (ref 8.4–10.5)
Chloride: 105 mEq/L (ref 96–112)
Creat: 0.96 mg/dL (ref 0.50–1.35)
Glucose, Bld: 88 mg/dL (ref 70–99)
Potassium: 4.1 mEq/L (ref 3.5–5.3)
Sodium: 140 mEq/L (ref 135–145)
Total Protein: 6.8 g/dL (ref 6.0–8.3)

## 2014-04-22 LAB — CBC
HEMATOCRIT: 42.7 % (ref 39.0–52.0)
Hemoglobin: 14.5 g/dL (ref 13.0–17.0)
MCH: 29.8 pg (ref 26.0–34.0)
MCHC: 34 g/dL (ref 30.0–36.0)
MCV: 87.9 fL (ref 78.0–100.0)
Platelets: 177 10*3/uL (ref 150–400)
RBC: 4.86 MIL/uL (ref 4.22–5.81)
RDW: 14.6 % (ref 11.5–15.5)
WBC: 5.3 10*3/uL (ref 4.0–10.5)

## 2014-04-22 LAB — PROTIME-INR
INR: 1.04 (ref <1.50)
Prothrombin Time: 13.5 seconds (ref 11.6–15.2)

## 2014-04-22 LAB — APTT: APTT: 30 s (ref 24–37)

## 2014-04-22 NOTE — Telephone Encounter (Signed)
Received a call from crissy requesting a signature on a indigent form that she faxed up here on patient. Form completed and faxed back to her.

## 2014-04-25 MED ORDER — GENTAMICIN SULFATE 40 MG/ML IJ SOLN
80.0000 mg | INTRAMUSCULAR | Status: DC
  Filled 2014-04-25 (×2): qty 2

## 2014-04-25 MED ORDER — CEFAZOLIN SODIUM-DEXTROSE 2-3 GM-% IV SOLR
2.0000 g | INTRAVENOUS | Status: DC
  Filled 2014-04-25: qty 50

## 2014-04-26 ENCOUNTER — Encounter (HOSPITAL_COMMUNITY): Payer: Self-pay | Admitting: General Practice

## 2014-04-26 ENCOUNTER — Ambulatory Visit (HOSPITAL_COMMUNITY)
Admission: RE | Admit: 2014-04-26 | Discharge: 2014-04-27 | Disposition: A | Discharge status: Home / Self Care | Payer: No Typology Code available for payment source | Source: Ambulatory Visit | Attending: Cardiovascular Disease | Admitting: Cardiovascular Disease

## 2014-04-26 ENCOUNTER — Encounter (HOSPITAL_COMMUNITY)
Admission: RE | Disposition: A | Discharge status: Home / Self Care | Payer: Self-pay | Source: Ambulatory Visit | Attending: Cardiovascular Disease

## 2014-04-26 DIAGNOSIS — I2589 Other forms of chronic ischemic heart disease: Secondary | ICD-10-CM | POA: Insufficient documentation

## 2014-04-26 DIAGNOSIS — I255 Ischemic cardiomyopathy: Secondary | ICD-10-CM | POA: Diagnosis present

## 2014-04-26 DIAGNOSIS — I5022 Chronic systolic (congestive) heart failure: Secondary | ICD-10-CM | POA: Insufficient documentation

## 2014-04-26 DIAGNOSIS — Z8249 Family history of ischemic heart disease and other diseases of the circulatory system: Secondary | ICD-10-CM | POA: Insufficient documentation

## 2014-04-26 DIAGNOSIS — E7800 Pure hypercholesterolemia, unspecified: Secondary | ICD-10-CM | POA: Insufficient documentation

## 2014-04-26 DIAGNOSIS — I252 Old myocardial infarction: Secondary | ICD-10-CM

## 2014-04-26 DIAGNOSIS — I509 Heart failure, unspecified: Secondary | ICD-10-CM | POA: Insufficient documentation

## 2014-04-26 DIAGNOSIS — I2510 Atherosclerotic heart disease of native coronary artery without angina pectoris: Secondary | ICD-10-CM | POA: Diagnosis present

## 2014-04-26 DIAGNOSIS — Z5189 Encounter for other specified aftercare: Secondary | ICD-10-CM | POA: Insufficient documentation

## 2014-04-26 DIAGNOSIS — Z7982 Long term (current) use of aspirin: Secondary | ICD-10-CM | POA: Insufficient documentation

## 2014-04-26 DIAGNOSIS — Z95810 Presence of automatic (implantable) cardiac defibrillator: Secondary | ICD-10-CM | POA: Diagnosis present

## 2014-04-26 DIAGNOSIS — Z7902 Long term (current) use of antithrombotics/antiplatelets: Secondary | ICD-10-CM | POA: Insufficient documentation

## 2014-04-26 DIAGNOSIS — E785 Hyperlipidemia, unspecified: Secondary | ICD-10-CM | POA: Diagnosis present

## 2014-04-26 DIAGNOSIS — Z21 Asymptomatic human immunodeficiency virus [HIV] infection status: Secondary | ICD-10-CM | POA: Insufficient documentation

## 2014-04-26 DIAGNOSIS — I253 Aneurysm of heart: Secondary | ICD-10-CM

## 2014-04-26 HISTORY — DX: Gastro-esophageal reflux disease without esophagitis: K21.9

## 2014-04-26 HISTORY — DX: Personal history of other diseases of the digestive system: Z87.19

## 2014-04-26 HISTORY — DX: Heart failure, unspecified: I50.9

## 2014-04-26 HISTORY — DX: Personal history of peptic ulcer disease: Z87.11

## 2014-04-26 HISTORY — DX: Presence of automatic (implantable) cardiac defibrillator: Z95.810

## 2014-04-26 HISTORY — DX: Migraine, unspecified, not intractable, without status migrainosus: G43.909

## 2014-04-26 HISTORY — DX: Low back pain, unspecified: M54.50

## 2014-04-26 HISTORY — PX: CARDIAC DEFIBRILLATOR PLACEMENT: SHX171

## 2014-04-26 HISTORY — DX: Headache, unspecified: R51.9

## 2014-04-26 HISTORY — PX: IMPLANTABLE CARDIOVERTER DEFIBRILLATOR IMPLANT: SHX5473

## 2014-04-26 HISTORY — DX: Headache: R51

## 2014-04-26 HISTORY — DX: Other chronic pain: G89.29

## 2014-04-26 HISTORY — DX: Low back pain: M54.5

## 2014-04-26 LAB — SURGICAL PCR SCREEN
MRSA, PCR: NEGATIVE
STAPHYLOCOCCUS AUREUS: NEGATIVE

## 2014-04-26 SURGERY — IMPLANTABLE CARDIOVERTER DEFIBRILLATOR IMPLANT
Anesthesia: LOCAL

## 2014-04-26 MED ORDER — PROMETHAZINE HCL 25 MG PO TABS: 50.0000 mg | ORAL_TABLET | Freq: Three times a day (TID) | ORAL | Status: DC | PRN

## 2014-04-26 MED ORDER — NITROGLYCERIN 0.4 MG SL SUBL: 0.4000 mg | SUBLINGUAL_TABLET | SUBLINGUAL | Status: DC | PRN

## 2014-04-26 MED ORDER — DIPHENHYDRAMINE HCL 25 MG PO CAPS
25.0000 mg | ORAL_CAPSULE | Freq: Two times a day (BID) | ORAL | Status: DC
  Administered 2014-04-26 – 2014-04-27 (×2): 25 mg via ORAL
  Filled 2014-04-26 (×3): qty 1

## 2014-04-26 MED ORDER — LISINOPRIL 5 MG PO TABS
5.0000 mg | ORAL_TABLET | Freq: Every day | ORAL | Status: DC
  Administered 2014-04-27: 09:00:00 5 mg via ORAL
  Filled 2014-04-26: qty 1

## 2014-04-26 MED ORDER — LIDOCAINE HCL (PF) 1 % IJ SOLN
INTRAMUSCULAR | Status: AC
  Filled 2014-04-26: qty 30

## 2014-04-26 MED ORDER — SODIUM CHLORIDE 0.9 % IV SOLN
INTRAVENOUS | Status: DC
  Administered 2014-04-26: 12:00:00 via INTRAVENOUS

## 2014-04-26 MED ORDER — YOU HAVE A PACEMAKER BOOK
Freq: Once | Status: AC
  Administered 2014-04-26: 22:00:00
  Filled 2014-04-26: qty 1

## 2014-04-26 MED ORDER — ONDANSETRON HCL 4 MG/2ML IJ SOLN: 4.0000 mg | Freq: Four times a day (QID) | INTRAMUSCULAR | Status: DC | PRN

## 2014-04-26 MED ORDER — EFAVIRENZ-EMTRICITAB-TENOFOVIR 600-200-300 MG PO TABS
1.0000 | ORAL_TABLET | Freq: Every day | ORAL | Status: DC
  Administered 2014-04-26: 22:00:00 1 via ORAL
  Filled 2014-04-26 (×2): qty 1

## 2014-04-26 MED ORDER — OXYCODONE-ACETAMINOPHEN 5-325 MG PO TABS
1.0000 | ORAL_TABLET | Freq: Three times a day (TID) | ORAL | Status: DC | PRN
  Administered 2014-04-26: 1 via ORAL
  Filled 2014-04-26: qty 1

## 2014-04-26 MED ORDER — ASPIRIN EC 325 MG PO TBEC
325.0000 mg | DELAYED_RELEASE_TABLET | Freq: Every day | ORAL | Status: DC
  Administered 2014-04-27: 09:00:00 325 mg via ORAL
  Filled 2014-04-26: qty 1

## 2014-04-26 MED ORDER — SODIUM CHLORIDE 0.9 % IJ SOLN: 3.0000 mL | INTRAMUSCULAR | Status: DC | PRN

## 2014-04-26 MED ORDER — CEFAZOLIN SODIUM 1-5 GM-% IV SOLN
1.0000 g | Freq: Four times a day (QID) | INTRAVENOUS | Status: AC
  Administered 2014-04-26 – 2014-04-27 (×3): 1 g via INTRAVENOUS
  Filled 2014-04-26 (×3): qty 50

## 2014-04-26 MED ORDER — FENTANYL CITRATE 0.05 MG/ML IJ SOLN
INTRAMUSCULAR | Status: AC
  Filled 2014-04-26: qty 2

## 2014-04-26 MED ORDER — ENSURE COMPLETE PO LIQD
237.0000 mL | Freq: Three times a day (TID) | ORAL | Status: DC
  Administered 2014-04-26 – 2014-04-27 (×2): 237 mL via ORAL
  Filled 2014-04-26 (×7): qty 237

## 2014-04-26 MED ORDER — MUPIROCIN 2 % EX OINT
TOPICAL_OINTMENT | Freq: Two times a day (BID) | CUTANEOUS | Status: DC
  Administered 2014-04-26: 1 via NASAL

## 2014-04-26 MED ORDER — HEPARIN (PORCINE) IN NACL 2-0.9 UNIT/ML-% IJ SOLN
INTRAMUSCULAR | Status: AC
  Filled 2014-04-26: qty 500

## 2014-04-26 MED ORDER — ADULT MULTIVITAMIN W/MINERALS CH
1.0000 | ORAL_TABLET | Freq: Every day | ORAL | Status: DC
  Filled 2014-04-26: qty 1

## 2014-04-26 MED ORDER — CLOPIDOGREL BISULFATE 75 MG PO TABS
75.0000 mg | ORAL_TABLET | Freq: Every day | ORAL | Status: DC
  Administered 2014-04-27: 09:00:00 75 mg via ORAL
  Filled 2014-04-26: qty 1

## 2014-04-26 MED ORDER — OXYCODONE-ACETAMINOPHEN 7.5-325 MG PO TABS: 1.0000 | ORAL_TABLET | Freq: Three times a day (TID) | ORAL | Status: DC | PRN

## 2014-04-26 MED ORDER — GABAPENTIN 100 MG PO CAPS
100.0000 mg | ORAL_CAPSULE | Freq: Three times a day (TID) | ORAL | Status: DC
  Administered 2014-04-26 – 2014-04-27 (×3): 100 mg via ORAL
  Filled 2014-04-26 (×5): qty 1

## 2014-04-26 MED ORDER — ACETAMINOPHEN 325 MG PO TABS: 325.0000 mg | ORAL_TABLET | ORAL | Status: DC | PRN

## 2014-04-26 MED ORDER — MIDAZOLAM HCL 5 MG/5ML IJ SOLN
INTRAMUSCULAR | Status: AC
  Filled 2014-04-26: qty 5

## 2014-04-26 MED ORDER — OXYCODONE HCL 5 MG PO TABS
2.5000 mg | ORAL_TABLET | Freq: Three times a day (TID) | ORAL | Status: DC | PRN
  Administered 2014-04-26: 2.5 mg via ORAL
  Filled 2014-04-26: qty 1

## 2014-04-26 MED ORDER — VITAMIN D (ERGOCALCIFEROL) 1.25 MG (50000 UNIT) PO CAPS
50000.0000 [IU] | ORAL_CAPSULE | ORAL | Status: DC
  Administered 2014-04-27: 50000 [IU] via ORAL
  Filled 2014-04-26: qty 1

## 2014-04-26 MED ORDER — DRONABINOL 2.5 MG PO CAPS
2.5000 mg | ORAL_CAPSULE | Freq: Two times a day (BID) | ORAL | Status: DC
  Administered 2014-04-26 – 2014-04-27 (×2): 2.5 mg via ORAL
  Filled 2014-04-26 (×2): qty 1

## 2014-04-26 MED ORDER — CARVEDILOL 12.5 MG PO TABS
12.5000 mg | ORAL_TABLET | Freq: Two times a day (BID) | ORAL | Status: DC
  Administered 2014-04-26 – 2014-04-27 (×2): 12.5 mg via ORAL
  Filled 2014-04-26 (×4): qty 1

## 2014-04-26 MED ORDER — TRAMADOL HCL 50 MG PO TABS: 50.0000 mg | ORAL_TABLET | Freq: Three times a day (TID) | ORAL | Status: DC | PRN

## 2014-04-26 MED ORDER — MUPIROCIN 2 % EX OINT
TOPICAL_OINTMENT | CUTANEOUS | Status: AC
  Filled 2014-04-26: qty 22

## 2014-04-26 MED ORDER — METHOCARBAMOL 500 MG PO TABS: 500.0000 mg | ORAL_TABLET | Freq: Every evening | ORAL | Status: DC | PRN

## 2014-04-26 NOTE — H&P (Signed)
Chief Complaint:  AICD - primary prevention  HPI:  Edward Haley is a pleasant 48 year old male with an extensive past medical history. Unfortunately he has a history of HIV disease but has a undetectable viral load. He's been on almost every antiviral medication over the years since his diagnosis which I believe was in the 1980s or 1990s. This is a history of incarceration for 17 years. He subsequently developed coronary artery disease and does have a strong family history of this. In 2011 he had his first MI which was a major anterior MI. He had placement of a vision 5.0 x 18 MM MultiLink bare metal stent to the LAD and February 2011 at [facility] in [place]. Subsequently, he developed recurrent coronary disease and had 2 stents placed in the right coronary artery in March of 2014 in [place], [place]. These were Promus Premier drug-eluting stents (2.5 x 16 mm and 2.5 x 20 mm), both to the RCA. He has mentioned that on nuclear stress testing he was told to have an EF of 15%, with extensive scar. Most recently an echocardiogram performed in our office on March 2 shows a left ventricular ejection fraction of 25-30%.  He is on appropriate comprehensive treatment for congestive heart failure with relatively high dose of carvedilol and ACE inhibitor. He is unable to tolerate higher doses of medications due to relative hypotension. He has good functional status (NYHA class II) and is working at [employer]. He does not have angina pectoris. He has never experienced syncope or documented sustained ventricular tachycardia, that I am aware of.   PMHx:  Past Medical History  Diagnosis Date  . HIV infection   . MI (myocardial infarction)   . Hypertension   . Coronary artery disease   . Hyperlipidemia     Past Surgical History  Procedure Laterality Date  . Coronary angioplasty with stent placement  03/11/2013    Dr Salomon [facility] [place]) - Promus  Premiere Monorail 2.5x54mm to RCA & 2.5x60mm to RCA  . Coronary angioplasty with stent placement  02/11/2010    [facility], [place] - Multi-Link 5.0x13mm to LAD    FAMHx:  Family History  Problem Relation Age of Onset  . Stroke Mother   . Hypertension Mother   . Diabetes Mother   . Heart disease Sister   . Cancer Sister     SOCHx:   reports that he has never smoked. He has never used smokeless tobacco. He reports that he uses illicit drugs (Marijuana). He reports that he does not drink alcohol.  ALLERGIES:  Allergies  Allergen Reactions  . Shellfish Allergy Itching and Swelling    ROS:  The patient specifically denies any chest pain at rest exertion, dyspnea at rest or with exertion, orthopnea, paroxysmal nocturnal dyspnea, syncope, palpitations, focal neurological deficits, intermittent claudication, lower extremity edema, unexplained weight gain, cough, hemoptysis or wheezing.   HOME MEDS: Medications Prior to Admission  Medication Sig Dispense Refill  . aspirin EC 325 MG tablet Take 325 mg by mouth daily.      . carvedilol (COREG) 12.5 MG tablet Take 1 tablet (12.5 mg total) by mouth 2 (two) times daily with a meal.  60 tablet  11  . clopidogrel (PLAVIX) 75 MG tablet Take 1 tablet (75 mg total) by mouth daily with breakfast.  30 tablet  11  . diphenhydrAMINE (BENADRYL) 25 mg capsule Take 25 mg by mouth 2 (two) times daily.       Marland Kitchen  dronabinol (MARINOL) 2.5 MG capsule Take 1 capsule (2.5 mg total) by mouth 2 (two) times daily before a meal.  60 capsule  5  . efavirenz-emtricitabine-tenofovir (ATRIPLA) 600-200-300 MG per tablet Take 1 tablet by mouth at bedtime.  30 tablet  5  . ENSURE PLUS (ENSURE PLUS) LIQD Take 237 mLs by mouth 2 (two) times daily between meals.      . gabapentin (NEURONTIN) 100 MG capsule Take 100 mg by mouth 3 (three) times daily.      Marland Kitchen. lisinopril (ZESTRIL) 10 MG tablet Take 0.5 tablets (5 mg total) by mouth daily.  30 tablet  6  .  methocarbamol (ROBAXIN) 500 MG tablet Take 1 tablet (500 mg total) by mouth at bedtime as needed for muscle spasms.  30 tablet  1  . Multiple Vitamin (MULTIVITAMIN WITH MINERALS) TABS tablet Take 1 tablet by mouth at bedtime.      . promethazine (PHENERGAN) 50 MG tablet Take 1 tablet (50 mg total) by mouth every 8 (eight) hours as needed for nausea or vomiting.  60 tablet  11  . rosuvastatin (CRESTOR) 40 MG tablet Take 1 tablet (40 mg total) by mouth at bedtime.  90 tablet  3  . Vitamin D, Ergocalciferol, (DRISDOL) 50000 UNITS CAPS capsule Take 1 capsule (50,000 Units total) by mouth every 7 (seven) days.  12 capsule  0  . nitroGLYCERIN (NITROSTAT) 0.4 MG SL tablet Place 0.4 mg under the tongue every 5 (five) minutes as needed for chest pain.      Marland Kitchen. oxyCODONE-acetaminophen (PERCOCET) 7.5-325 MG per tablet Take 1 tablet by mouth every 8 (eight) hours as needed for pain.  30 tablet  0  . tadalafil (CIALIS) 20 MG tablet Take 1 tablet (20 mg total) by mouth daily as needed for erectile dysfunction.  30 tablet  3  . traMADol (ULTRAM) 50 MG tablet Take 1 tablet (50 mg total) by mouth every 8 (eight) hours as needed for moderate pain.  30 tablet  0    LABS/IMAGING: No results found for this or any previous visit (from the past 48 hour(s)). No results found.  VITALS: Blood pressure 122/87, pulse 63, temperature 97.6 F (36.4 C), temperature source Oral, resp. rate 20, height 6\' 1"  (1.854 m), weight 207 lb (93.895 kg), SpO2 98.00%.  EXAM:  General: Alert, oriented x3, no distress Head: no evidence of trauma, PERRL, EOMI, no exophtalmos or lid lag, no myxedema, no xanthelasma; normal ears, nose and oropharynx Neck: normal jugular venous pulsations and no hepatojugular reflux; brisk carotid pulses without delay and no carotid bruits Chest: clear to auscultation, no signs of consolidation by percussion or palpation, normal fremitus, symmetrical and full respiratory excursions Cardiovascular: normal  position and quality of the apical impulse, regular rhythm, normal first heart sound and normal second heart sounds, no rubs or gallops, no murmur Abdomen: no tenderness or distention, no masses by palpation, no abnormal pulsatility or arterial bruits, normal bowel sounds, no hepatosplenomegaly Extremities: no clubbing, cyanosis or edema; 2+ radial, ulnar and brachial pulses bilaterally; 2+ right femoral, posterior tibial and dorsalis pedis pulses; 2+ left femoral, posterior tibial and dorsalis pedis pulses; no subclavian or femoral bruits Neurological: grossly nonfocal   IMPRESSION: Mr. Edward Haley clearly meets criteria for primary prevention ICD implantation for ischemic cardiomyopathy (Prior myocardial infarction, left ventricular ejection fraction under 35%, heart failure NYHA class II-III, duration >3 months,on comprehensive medical therapy).  Discussed the purpose of defibrillators, their mechanism of action, details of the implantation procedure, long-term followup  and possible complications.  This procedure has been fully reviewed with the patient and written informed consent has been obtained.   PLAN: Single chamber ICD implantation  Thurmon Fair, MD, [facility] [phone] office [phone] pager  04/26/2014, 12:39 PM

## 2014-04-26 NOTE — Progress Notes (Signed)
ICD Criteria  Current LVEF:25-30% ;Obtained > or = 1 month ago and < or = 3 months ago.  NYHA Functional Classification: Class II  Heart Failure History:  Yes, Duration of heart failure since onset is > 9 months  Non-Ischemic Dilated Cardiomyopathy History:  No.  Atrial Fibrillation/Atrial Flutter:  No.  Ventricular Tachycardia History:  No.  Cardiac Arrest History:  No  History of Syndromes with Risk of Sudden Death:  No.  Previous ICD:  No.  Electrophysiology Study: No.  Prior MI: Yes, Most recent MI timeframe is > 40 days.  PPM: No.  OSA:  No  Patient Life Expectancy of >=1 year: Yes.  Anticoagulation Therapy:  Patient is NOT on anticoagulation therapy.   Beta Blocker Therapy:  Yes.   Ace Inhibitor/ARB Therapy:  Yes.

## 2014-04-26 NOTE — Care Management Note (Addendum)
  Page 1 of 1   04/26/2014     4:18:55 PM CARE MANAGEMENT NOTE 04/26/2014  Patient:  Edward Haley, Edward Haley   Account Number:  [id number]  Date Initiated:  04/26/2014  Documentation initiated by:  Donato Schultz  Subjective/Objective Assessment:   Admitted with heart disease     Action/Plan:   CM to follow for disposition needs   Anticipated DC Date:  04/27/2014   Anticipated DC Plan:  HOME/SELF CARE         Choice offered to / List presented to:             Status of service:  In process, will continue to follow Medicare Important Message given?   (If response is "NO", the following Medicare IM given date fields will be blank) Date Medicare IM given:   Date Additional Medicare IM given:    Discharge Disposition:    Per UR Regulation:    If discussed at Long Length of Stay Meetings, dates discussed:    Comments:

## 2014-04-26 NOTE — Op Note (Signed)
Procedure report  Procedure performed:  1. Implantation of new single chamber cardioverter defibrillator 2. Fluoroscopy 3. Moderate sedation 4. Initial lead and generator testing 5. Left subclavian venography    Reason for procedure:  Primary prevention of sudden cardiac death Ischemic cardiomyopathy, left ventricular ejection fraction less than 30%, Heart failure NYHA class 2-3, on comprehensive medical therapy for over 90 days (MADIT-II) Procedure performed by: Thurmon FairMihai Lucylle Foulkes, MD  Complications: None  Estimated blood loss: <10 mL  Medications administered during procedure: Ancef 2 g intravenously Lidocaine 1% 30 mL locally,  Fentanyl 150 mcg intravenously Versed 6 mg intravenously  Device details: Generator Medtronic Evera model DVBB1D4 serial number [id number] Right ventricular lead Medtronic [id number] single coil active fixation serial number [id number]  Procedure details:  After the risks and benefits of the procedure were discussed the patient provided informed consent and was brought to the cardiac cath lab in the fasting state. The patient was prepped and draped in usual sterile fashion. Local anesthesia with 1% lidocaine was administered to to the left infraclavicular area. A 5-6 cm horizontal incision was made parallel with and 2-3 cm caudal to the left clavicle. Using electrocautery and blunt dissection a prepectoral pocket was created down to the level of the pectoralis major muscle fascia. The pocket was carefully inspected for hemostasis. An antibiotic-soaked sponge was placed in the pocket.  Under fluoroscopic guidance and using the modified Seldinger technique a single venipuncture was performed to access the left subclavian vein. No difficulty was encountered accessing the vein.  A J-tip guidewire was subsequently exchanged for a 9.69F [place] safe sheath.  Under fluoroscopic guidance the ventricular lead was advanced to level of the mid to apical right  ventricular septum and thet active-fixation helix was deployed. Prominent current of injury was seen. Satisfactory pacing and sensing parameters were recorded. There was no evidence of diaphragmatic stimulation at maximum device output. The safe sheath was peeled away and the lead was secured in place with 2-0 silk.  In similar fashion the right atrial lead was advanced to the level of the atrial appendage. The active-fixation helix was deployed. There was prominent current of injury. Satisfactory  pacing and sensing parameters were recorded. There was no evidence of diaphragmatic stimulation with pacing at maximum device output. The safe sheath was peeled away and the lead was secured in place with 2-0 silk.  The antibiotic-soaked sponge was removed from the pocket. The pocket was flushed with copious amounts of antibiotic solution. Reinspection showed excellent hemostasis.  The ventricular lead was connected to the generator and appropriate ventricular pacing was seen. Subsequently the atrial lead was also connected. Repeat testing of the lead parameters later showed excellent values.  The entire system was then carefully inserted in the pocket with care been taking that the leads and device assumed a comfortable position without pressure on the incision. Great care was taken that the leads be located deep to the generator. The pocket was then closed in layers using 2 layers of 2-0 Vicryl and cutaneous staples, after which a sterile dressing was applied.  Defibrillation threshold testing was then performed. After adequate sedation was achieved, ventricular fibrillation was induced with a 1J shock on T method. There was appropriate sensing by the device. There were no dropouts during "least sensitive" settings. The arrhythmia was terminated by a single 15J shock. High voltage impedance during the shock was 58 ohm. Retesting of the leads confirmed normal function.  At the end of the procedure the  following lead parameters were  encountered: Right ventricular lead sensed R waves 17 mV, impedance 513 ohm, threshold 0.5 V at 0.4 ms pulse width. High voltage impedance 58 ohm, charge time 3.2 seconds  Thurmon Fair, MD, [facility] [phone] office [phone] pager

## 2014-04-27 ENCOUNTER — Ambulatory Visit (HOSPITAL_COMMUNITY): Payer: No Typology Code available for payment source

## 2014-04-27 DIAGNOSIS — E785 Hyperlipidemia, unspecified: Secondary | ICD-10-CM | POA: Diagnosis present

## 2014-04-27 DIAGNOSIS — Z95810 Presence of automatic (implantable) cardiac defibrillator: Secondary | ICD-10-CM

## 2014-04-27 MED ORDER — ACETAMINOPHEN 325 MG PO TABS: 325.0000 mg | ORAL_TABLET | ORAL | Status: DC | PRN

## 2014-04-27 NOTE — Discharge Instructions (Signed)

## 2014-04-27 NOTE — Progress Notes (Signed)
   TELEMETRY: Reviewed telemetry pt in NSR with occ. PVCs, couplets: Filed Vitals:   04/26/14 2012 04/26/14 2330 04/27/14 0619 04/27/14 0624  BP: 124/77 122/80 144/105 136/105  Pulse: 68 75 68   Temp: 98.1 F (36.7 C) 97.4 F (36.3 C) 98 F (36.7 C)   TempSrc: Oral Oral Oral   Resp: 18  20   Height:      Weight:   208 lb 5.4 oz (94.5 kg)   SpO2: 100% 100% 100%     Intake/Output Summary (Last 24 hours) at 04/27/14 0734 Last data filed at 04/26/14 2143  Gross per 24 hour  Intake    360 ml  Output    400 ml  Net    -40 ml    SUBJECTIVE Feels a little sore at surgical site. No chest pain or SOB. No fever.  LABS: N/A  Radiology/Studies:  CXR shows satisfactory lead position. No pneumothorax.   PHYSICAL EXAM General: Well developed, well nourished, in no acute distress. Head: Normal Neck: Negative for carotid bruits. JVD not elevated. Lungs: Clear bilaterally to auscultation without wheezes, rales, or rhonchi. Breathing is unlabored. Heart: RRR S1 S2 without murmurs, rubs, or gallops.  ICD site: No significant swelling or drainage. Abdomen: Soft, non-tender, non-distended with normoactive bowel sounds. No hepatomegaly. No rebound/guarding. No obvious abdominal masses. Msk:  Strength and tone appears normal for age. Extremities: No clubbing, cyanosis or edema.  Distal pedal pulses are 2+ and equal bilaterally. Neuro: Alert and oriented X 3. Moves all extremities spontaneously. Psych:  Responds to questions appropriately with a normal affect.  ASSESSMENT AND PLAN: 1. Chronic systolic CHF. EF 25%. Now s/p ICD implant. Stable post op. Plan DC home today. Continue ACEi and carvedilol.   2. CAD s/p multiple stents. Continue ASA, Plavix.  3. HIV on chronic antiviral therapy.  4. Hypercholesterolemia. On crestor.   5. Disability. Patient has questions concerning his disability determination. Will need to discuss with Dr. Royann Shivers.   Active Problems:   ICD (implantable  cardioverter-defibrillator) in place    Signed, Peter M [place] MD,FACC 04/27/2014 7:39 AM

## 2014-04-27 NOTE — Discharge Summary (Signed)
Patient seen and examined and history reviewed. Agree with above findings and plan. See earlier rounding note.  Demetria Pore JordanMD 04/27/2014 10:51 AM

## 2014-04-27 NOTE — Discharge Summary (Signed)
Physician Discharge Summary     Patient ID: Edward Haley MRN: [id number] DOB/AGE: [birth year] 49 y.o.  Admit date: 04/26/2014 Discharge date: 04/27/2014  Admission Diagnoses:   Ischemic cardiomyopathy, EF 25-30% 02/28/14  Discharge Diagnoses:  Principal Problem:   ICD (implantable cardioverter-defibrillator) in place Active Problems:   CAD (coronary artery disease)   Ischemic cardiomyopathy, EF 25-30% 02/28/14   HLD (hyperlipidemia)   Discharged Condition: stable  Hospital Course:   Edward Haley is a pleasant 48 year old male with an extensive past medical history. Unfortunately he has a history of HIV disease but has a undetectable viral load. He's been on almost every antiviral medication over the years since his diagnosis which I believe was in the 1980s or 1990s. This is a history of incarceration for 17 years. He subsequently developed coronary artery disease and does have a strong family history of this. In 2011 he had his first MI which was a major anterior MI. He had placement of a vision 5.0 x 18 MM MultiLink bare metal stent to the LAD and February 2011 at [facility] in [place].  Subsequently, he developed recurrent coronary disease and had 2 stents placed in the right coronary artery in March of 2014 in [place], [place]. These were Promus Premier drug-eluting stents (2.5 x 16 mm and 2.5 x 20 mm), both to the RCA. He has mentioned that on nuclear stress testing he was told to have an EF of 15%, with extensive scar. Most recently an echocardiogram performed in our office on March 2 shows a left ventricular ejection fraction of 25-30%.   He is on appropriate comprehensive treatment for congestive heart failure with relatively high dose of carvedilol and ACE inhibitor. He is unable to tolerate higher doses of medications due to relative hypotension. He has good functional status (NYHA class II) and is working at [employer]. He does not have angina pectoris. He  has never experienced syncope or documented sustained ventricular tachycardia, that I am aware of.    The patient was scheduled for ICD implant for primary prevention ICD implantation for ischemic cardiomyopathy (Prior myocardial infarction, left ventricular ejection fraction under 35%, heart failure NYHA class II-III, duration >3 months,on comprehensive medical therapy).  It was completed without complications.  No pneumothorax on CXR.  The patient was seen by Dr. Swaziland who felt he was stable for DC home.      Consults: None  Significant Diagnostic Studies: CXR No complications or pneumothorax.   Procedure report  Procedure performed:  1. Implantation of new single chamber cardioverter defibrillator  2. Fluoroscopy  3. Moderate sedation  4. Initial lead and generator testing  5. Left subclavian venography  Reason for procedure:  Primary prevention of sudden cardiac death  Ischemic cardiomyopathy, left ventricular ejection fraction less than 30%, Heart failure NYHA class 2-3, on comprehensive medical therapy for over 90 days (MADIT-II)  Procedure performed by:  Thurmon Fair, MD  Complications:  None  Estimated blood loss:  <10 mL  Medications administered during procedure:  Ancef 2 g intravenously  Lidocaine 1% 30 mL locally,  Fentanyl 150 mcg intravenously  Versed 6 mg intravenously  Device details:  Generator Medtronic Evera model DVBB1D4 serial number [id number]  Right ventricular lead Medtronic [id number] single coil active fixation serial number [id number]  Procedure details:  After the risks and benefits of the procedure were discussed the patient provided informed consent and was brought to the cardiac cath lab in the fasting state. The patient was prepped and  draped in usual sterile fashion. Local anesthesia with 1% lidocaine was administered to to the left infraclavicular area. A 5-6 cm horizontal incision was made parallel with and 2-3 cm caudal to the left clavicle.  Using electrocautery and blunt dissection a prepectoral pocket was created down to the level of the pectoralis major muscle fascia. The pocket was carefully inspected for hemostasis. An antibiotic-soaked sponge was placed in the pocket.  Under fluoroscopic guidance and using the modified Seldinger technique a single venipuncture was performed to access the left subclavian vein. No difficulty was encountered accessing the vein. A J-tip guidewire was subsequently exchanged for a 9.47F [place] safe sheath.  Under fluoroscopic guidance the ventricular lead was advanced to level of the mid to apical right ventricular septum and thet active-fixation helix was deployed. Prominent current of injury was seen. Satisfactory pacing and sensing parameters were recorded. There was no evidence of diaphragmatic stimulation at maximum device output. The safe sheath was peeled away and the lead was secured in place with 2-0 silk.  In similar fashion the right atrial lead was advanced to the level of the atrial appendage. The active-fixation helix was deployed. There was prominent current of injury. Satisfactory pacing and sensing parameters were recorded. There was no evidence of diaphragmatic stimulation with pacing at maximum device output. The safe sheath was peeled away and the lead was secured in place with 2-0 silk.  The antibiotic-soaked sponge was removed from the pocket. The pocket was flushed with copious amounts of antibiotic solution. Reinspection showed excellent hemostasis.  The ventricular lead was connected to the generator and appropriate ventricular pacing was seen. Subsequently the atrial lead was also connected. Repeat testing of the lead parameters later showed excellent values.  The entire system was then carefully inserted in the pocket with care been taking that the leads and device assumed a comfortable position without pressure on the incision. Great care was taken that the leads be located deep to the  generator. The pocket was then closed in layers using 2 layers of 2-0 Vicryl and cutaneous staples, after which a sterile dressing was applied.  Defibrillation threshold testing was then performed. After adequate sedation was achieved, ventricular fibrillation was induced with a 1J shock on T method. There was appropriate sensing by the device. There were no dropouts during "least sensitive" settings. The arrhythmia was terminated by a single 15J shock. High voltage impedance during the shock was 58 ohm. Retesting of the leads confirmed normal function.  At the end of the procedure the following lead parameters were encountered:  Right ventricular lead sensed R waves 17 mV, impedance 513 ohm, threshold 0.5 V at 0.4 ms pulse width.  High voltage impedance 58 ohm, charge time 3.2 seconds  Thurmon FairMihai Croitoru, MD, [facility]  [facility]  Treatments: See above  Discharge Exam: Blood pressure 153/108, pulse 67, temperature 97.5 F (36.4 C), temperature source Oral, resp. rate 18, height 6\' 1"  (1.854 m), weight 208 lb 5.4 oz (94.5 kg), SpO2 99.00%.   Disposition: 01-Home or Self Care      Discharge Orders   Future Appointments Provider Department Dept Phone   05/05/2014 4:00 PM [facility] [address] Office [phone]   05/16/2014 10:30 AM Chrystie NoseKenneth C. Hilty, MD [facility] [phone]   06/09/2014 11:00 AM Doris Cheadleeepak Advani, MD [facility] [phone]   Future Orders Complete By Expires   Call MD for:  redness, tenderness, or signs of infection (pain, swelling, redness, odor or  green/yellow discharge around incision site)  As directed    Diet - low sodium heart healthy  As directed    Increase activity slowly  As directed        Medication List         acetaminophen 325 MG tablet  Commonly known as:  TYLENOL  Take 1-2 tablets (325-650 mg total) by mouth every 4 (four) hours as needed for mild pain.     aspirin EC 325 MG tablet    Take 325 mg by mouth daily.     carvedilol 12.5 MG tablet  Commonly known as:  COREG  Take 1 tablet (12.5 mg total) by mouth 2 (two) times daily with a meal.     clopidogrel 75 MG tablet  Commonly known as:  PLAVIX  Take 1 tablet (75 mg total) by mouth daily with breakfast.     diphenhydrAMINE 25 mg capsule  Commonly known as:  BENADRYL  Take 25 mg by mouth 2 (two) times daily.     dronabinol 2.5 MG capsule  Commonly known as:  MARINOL  Take 1 capsule (2.5 mg total) by mouth 2 (two) times daily before a meal.     efavirenz-emtricitabine-tenofovir 600-200-300 MG per tablet  Commonly known as:  ATRIPLA  Take 1 tablet by mouth at bedtime.     ENSURE PLUS Liqd  Take 237 mLs by mouth 2 (two) times daily between meals.     gabapentin 100 MG capsule  Commonly known as:  NEURONTIN  Take 100 mg by mouth 3 (three) times daily.     lisinopril 10 MG tablet  Commonly known as:  ZESTRIL  Take 0.5 tablets (5 mg total) by mouth daily.     methocarbamol 500 MG tablet  Commonly known as:  ROBAXIN  Take 1 tablet (500 mg total) by mouth at bedtime as needed for muscle spasms.     multivitamin with minerals Tabs tablet  Take 1 tablet by mouth at bedtime.     nitroGLYCERIN 0.4 MG SL tablet  Commonly known as:  NITROSTAT  Place 0.4 mg under the tongue every 5 (five) minutes as needed for chest pain.     oxyCODONE-acetaminophen 7.5-325 MG per tablet  Commonly known as:  PERCOCET  Take 1 tablet by mouth every 8 (eight) hours as needed for pain.     promethazine 50 MG tablet  Commonly known as:  PHENERGAN  Take 1 tablet (50 mg total) by mouth every 8 (eight) hours as needed for nausea or vomiting.     rosuvastatin 40 MG tablet  Commonly known as:  CRESTOR  Take 1 tablet (40 mg total) by mouth at bedtime.     tadalafil 20 MG tablet  Commonly known as:  CIALIS  Take 1 tablet (20 mg total) by mouth daily as needed for erectile dysfunction.     traMADol 50 MG tablet  Commonly known  as:  ULTRAM  Take 1 tablet (50 mg total) by mouth every 8 (eight) hours as needed for moderate pain.     Vitamin D (Ergocalciferol) 50000 UNITS Caps capsule  Commonly known as:  DRISDOL  Take 1 capsule (50,000 Units total) by mouth every 7 (seven) days.       Follow-up Information   Follow up with Device clinic On 05/05/2014. (4:00PM-  For wound re-check.)    Contact information:   [address] [place] [address] [phone]      Signed: Wilburt Finlay 04/27/2014, 8:14 AM

## 2014-05-05 ENCOUNTER — Ambulatory Visit (INDEPENDENT_AMBULATORY_CARE_PROVIDER_SITE_OTHER): Payer: No Typology Code available for payment source | Admitting: *Deleted

## 2014-05-05 DIAGNOSIS — I2589 Other forms of chronic ischemic heart disease: Secondary | ICD-10-CM

## 2014-05-05 DIAGNOSIS — I255 Ischemic cardiomyopathy: Secondary | ICD-10-CM

## 2014-05-05 LAB — MDC_IDC_ENUM_SESS_TYPE_INCLINIC
Battery Voltage: 3.11 V
Brady Statistic RV Percent Paced: 0.01 %
Date Time Interrogation Session: 20150507203045
HIGH POWER IMPEDANCE MEASURED VALUE: 190 Ohm
HighPow Impedance: 58 Ohm
Lead Channel Pacing Threshold Amplitude: 0.5 V
Lead Channel Pacing Threshold Pulse Width: 0.4 ms
Lead Channel Sensing Intrinsic Amplitude: 23.625 mV
Lead Channel Sensing Intrinsic Amplitude: 29.625 mV
Lead Channel Setting Pacing Pulse Width: 0.4 ms
MDC IDC MSMT BATTERY REMAINING LONGEVITY: 137 mo
MDC IDC MSMT LEADCHNL RV IMPEDANCE VALUE: 456 Ohm
MDC IDC SET LEADCHNL RV PACING AMPLITUDE: 3.5 V
MDC IDC SET LEADCHNL RV SENSING SENSITIVITY: 0.3 mV
MDC IDC SET ZONE DETECTION INTERVAL: 450 ms
Zone Setting Detection Interval: 300 ms
Zone Setting Detection Interval: 360 ms

## 2014-05-05 LAB — PACEMAKER DEVICE OBSERVATION

## 2014-05-09 ENCOUNTER — Telehealth: Payer: Self-pay | Admitting: *Deleted

## 2014-05-09 NOTE — Telephone Encounter (Signed)
Patient called asking for a refill of ensure to be faxed to [facility].  RN informed patient that [facility] has changed their policy, can only fill ensure prescriptions for those with low BMI's (<=20).  Pt's BMI = 27.4 on 4/28.  Pt stated he understood, that he was certain he would qualify for it within a few months.  RN advised patient that he could ask about alternatives, including an instant breakfast mix.  Pt stated that his "health is too complicated for that." Andree Coss, RN

## 2014-05-09 NOTE — Progress Notes (Signed)
Wound check appointment. Staples removed. Wound without redness or edema. Incision edges approximated, wound well healed. Normal device function. Threshold, sensing, and impedances consistent with implant measurements. Device programmed at 3.5V for extra safety margin until office visit. Histogram distribution appropriate for patient and level of activity. No ventricular arrhythmias noted. Patient educated about wound care, arm mobility, lifting restrictions, shock plan. ROV with [facility] on 5-28.

## 2014-05-10 ENCOUNTER — Encounter: Payer: Self-pay | Admitting: Cardiovascular Disease

## 2014-05-16 ENCOUNTER — Ambulatory Visit (INDEPENDENT_AMBULATORY_CARE_PROVIDER_SITE_OTHER): Payer: No Typology Code available for payment source | Admitting: Internal Medicine

## 2014-05-16 ENCOUNTER — Encounter: Payer: Self-pay | Admitting: Internal Medicine

## 2014-05-16 VITALS — BP 106/70 | HR 70 | Ht 73.0 in | Wt 206.2 lb

## 2014-05-16 DIAGNOSIS — I2589 Other forms of chronic ischemic heart disease: Secondary | ICD-10-CM

## 2014-05-16 DIAGNOSIS — I253 Aneurysm of heart: Secondary | ICD-10-CM

## 2014-05-16 DIAGNOSIS — I252 Old myocardial infarction: Secondary | ICD-10-CM

## 2014-05-16 DIAGNOSIS — I255 Ischemic cardiomyopathy: Secondary | ICD-10-CM

## 2014-05-16 DIAGNOSIS — I2510 Atherosclerotic heart disease of native coronary artery without angina pectoris: Secondary | ICD-10-CM

## 2014-05-16 NOTE — Patient Instructions (Signed)
Your physician wants you to follow-up in:  6 months. You will receive a reminder letter in the mail two months in advance. If you don't receive a letter, please call our office to schedule the follow-up appointment.   

## 2014-05-16 NOTE — Progress Notes (Signed)
OFFICE NOTE  Chief Complaint:  Follow-up ICD placement  Primary Care Physician: Jeanann LewandowskyJEGEDE, OLUGBEMIGA, MD  HPI:  Edward Haley is a pleasant 48 year old male with an extensive past medical history.  Unfortunately he has a history of HIV disease but has a undetectable viral load. He's been on almost every antiviral medication over the years since his diagnosis which I believe was in the 1980s or 1990s.  This is a history of incarceration for 17 years.  He subsequently developed coronary artery disease and does have a strong family history of this. In 2011 he had his first MI which was a major anterior MI.  He had placement of a vision 5.0 x 18 MM MultiLink bare metal stent to the LAD and February 2011 at [facility] in [place].  Subsequently, he developed recurrent coronary disease and had 2 stents placed in the right coronary artery in March of 2014 in [place].  These were Promus Premier drug-eluting stents (2.5 x 16 mm and 2.5 x 20 mm), both to the RCA.  He has mentioned that on nuclear stress testing he was told to have an EF of 15%, with extensive scar, however I do not have his cardiac records available today for evaluation. He is most recent cardiologist was at [facility] in [place]. A repeat echocardiogram was performed which showed an EF of about 25%. This is despite maximal medical therapy. I recommended referral for placement of an ICD to my partner. He went ahead and placed an ICD.  Interrogation to show normal function.  Overall he feels fairly well but does get short of breath with moderate exertion. I doubt he'll be able to perform any strenuous work or exercise and should be considered disabled.  It is unlikely given his prior heart attacks that his EF will improve much more than it is at this point.  PMHx:  Past Medical History  Diagnosis Date  . HIV infection dx'd 1993  . Hypertension   . Coronary artery disease   . Hyperlipidemia   .  Automatic implantable cardioverter-defibrillator in situ   . CHF (congestive heart failure)   . MI (myocardial infarction) 2011  . GERD (gastroesophageal reflux disease)   . History of stomach ulcers   . Daily headache   . Migraine     "qd" (04/26/2014)  . Chronic lower back pain     Past Surgical History  Procedure Laterality Date  . Cardiac defibrillator placement  04/26/2014  . Coronary angioplasty with stent placement  03/11/2013    [facility] [place] - Promus Premiere Monorail 2.5x116mm to RCA & 2.5x2320mm to RCA  . Coronary angioplasty with stent placement  02/11/2010    [facility] [place] - Multi-Link 5.0x6018mm to LAD  . Tonsillectomy    . Cystectomy  1990's    "2 taken off my bottom" (04/26/2014)    FAMHx:  Family History  Problem Relation Age of Onset  . Stroke Mother   . Hypertension Mother   . Diabetes Mother   . Heart disease Sister   . Cancer Sister     SOCHx:   reports that he has never smoked. He has never used smokeless tobacco. He reports that he uses illicit drugs (Marijuana). He reports that he does not drink alcohol.  ALLERGIES:  Allergies  Allergen Reactions  . Shellfish Allergy Itching and Swelling    ROS: A comprehensive review of systems was negative except for:  Constitutional: positive for fatigue Respiratory: positive for dyspnea on exertion Cardiovascular: positive for orthopnea HIV disease  HOME MEDS: Current Outpatient Prescriptions  Medication Sig Dispense Refill  . aspirin EC 325 MG tablet Take 325 mg by mouth daily.      . carvedilol (COREG) 12.5 MG tablet Take 1 tablet (12.5 mg total) by mouth 2 (two) times daily with a meal.  60 tablet  11  . clopidogrel (PLAVIX) 75 MG tablet Take 1 tablet (75 mg total) by mouth daily with breakfast.  30 tablet  11  . diphenhydrAMINE (BENADRYL) 25 mg capsule Take 25 mg by mouth 2 (two) times daily.       Marland Kitchen dronabinol (MARINOL) 2.5 MG capsule Take 1  capsule (2.5 mg total) by mouth 2 (two) times daily before a meal.  60 capsule  5  . efavirenz-emtricitabine-tenofovir (ATRIPLA) 600-200-300 MG per tablet Take 1 tablet by mouth at bedtime.  30 tablet  5  . ENSURE PLUS (ENSURE PLUS) LIQD Take 237 mLs by mouth 2 (two) times daily between meals.      . gabapentin (NEURONTIN) 100 MG capsule Take 100 mg by mouth 3 (three) times daily.      Marland Kitchen lisinopril (ZESTRIL) 10 MG tablet Take 0.5 tablets (5 mg total) by mouth daily.  30 tablet  6  . methocarbamol (ROBAXIN) 500 MG tablet Take 1 tablet (500 mg total) by mouth at bedtime as needed for muscle spasms.  30 tablet  1  . Multiple Vitamin (MULTIVITAMIN WITH MINERALS) TABS tablet Take 1 tablet by mouth at bedtime.      . nitroGLYCERIN (NITROSTAT) 0.4 MG SL tablet Place 0.4 mg under the tongue every 5 (five) minutes as needed for chest pain.      . rosuvastatin (CRESTOR) 40 MG tablet Take 1 tablet (40 mg total) by mouth at bedtime.  90 tablet  3  . tadalafil (CIALIS) 20 MG tablet Take 1 tablet (20 mg total) by mouth daily as needed for erectile dysfunction.  30 tablet  3  . traMADol (ULTRAM) 50 MG tablet Take 1 tablet (50 mg total) by mouth every 8 (eight) hours as needed for moderate pain.  30 tablet  0  . Vitamin D, Ergocalciferol, (DRISDOL) 50000 UNITS CAPS capsule Take 1 capsule (50,000 Units total) by mouth every 7 (seven) days.  12 capsule  0   No current facility-administered medications for this visit.    LABS/IMAGING: No results found for this or any previous visit (from the past 48 hour(s)). No results found.  VITALS: BP 106/70  Pulse 70  Ht 6\' 1"  (1.854 m)  Wt 206 lb 3.2 oz (93.532 kg)  BMI 27.21 kg/m2  EXAM: General appearance: alert and no distress Neck: no carotid bruit and no JVD Lungs: clear to auscultation bilaterally Heart: regular rate and rhythm, S1, S2 normal, no murmur, click, rub or gallop Abdomen: soft, non-tender; bowel sounds normal; no masses,  no  organomegaly Extremities: extremities normal, atraumatic, no cyanosis or edema Pulses: 2+ and symmetric Skin: Skin color, texture, turgor normal. No rashes or lesions Neurologic: Grossly normal Psych: Mood, affect normal  EKG: deferred  ASSESSMENT: 1. 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 Ischemic cardiomyopathy, EF 15% by patient report - 25-30% by echo in our office 3. PCI to the proximal LAD with a 5.0 x 18 mm Vision Multilink BMS in 2011 4. PCI x 2 to the mid-RCA with 2.5 x 16 mm and 2.5 x 20  mm Promus Premier DESs 5. Dyslipidemia 6. Peripheral neuropathy 7. Long-standing HIV disease-with undetectable viral load 8. S/p AICD recently  PLAN: 1.   Edward Haley had a successful placement of an AICD. This is for primary prevention of sudden cardiac death given his significant cardiomyopathy. He seems to be well compensated although he does get short of breath when doing activities he has no pulmonary edema, flat neck veins and no peripheral edema. There is no signs of decompensated heart failure. I recommend continuing his current heart failure medications and plan to see him back in 6 months.  Plan for followup in 6 months.  Chrystie Nose, MD, [facility] Attending Cardiologist [facility]  Chrystie Nose 05/16/2014, 1:57 PM

## 2014-05-17 ENCOUNTER — Other Ambulatory Visit: Payer: Self-pay | Admitting: Internal Medicine

## 2014-05-17 ENCOUNTER — Other Ambulatory Visit: Payer: Self-pay

## 2014-05-17 DIAGNOSIS — B20 Human immunodeficiency virus [HIV] disease: Secondary | ICD-10-CM

## 2014-05-17 DIAGNOSIS — Z760 Encounter for issue of repeat prescription: Secondary | ICD-10-CM

## 2014-05-17 DIAGNOSIS — M62838 Other muscle spasm: Secondary | ICD-10-CM

## 2014-05-17 MED ORDER — DRONABINOL 2.5 MG PO CAPS: 2.5000 mg | ORAL_CAPSULE | Freq: Two times a day (BID) | ORAL | Status: DC

## 2014-05-26 ENCOUNTER — Ambulatory Visit (INDEPENDENT_AMBULATORY_CARE_PROVIDER_SITE_OTHER): Payer: No Typology Code available for payment source | Admitting: Cardiovascular Disease

## 2014-05-26 ENCOUNTER — Encounter: Payer: Self-pay | Admitting: Cardiovascular Disease

## 2014-05-26 VITALS — BP 108/73 | HR 65 | Ht 73.0 in | Wt 205.7 lb

## 2014-05-26 DIAGNOSIS — I255 Ischemic cardiomyopathy: Secondary | ICD-10-CM

## 2014-05-26 DIAGNOSIS — I2589 Other forms of chronic ischemic heart disease: Secondary | ICD-10-CM

## 2014-05-26 LAB — MDC_IDC_ENUM_SESS_TYPE_INCLINIC
Battery Remaining Longevity: 136 mo
HIGH POWER IMPEDANCE MEASURED VALUE: 171 Ohm
HIGH POWER IMPEDANCE MEASURED VALUE: 58 Ohm
Lead Channel Impedance Value: 456 Ohm
Lead Channel Pacing Threshold Amplitude: 0.625 V
Lead Channel Pacing Threshold Pulse Width: 0.4 ms
Lead Channel Sensing Intrinsic Amplitude: 24 mV
Lead Channel Sensing Intrinsic Amplitude: 28.125 mV
Lead Channel Setting Pacing Amplitude: 2.5 V
MDC IDC MSMT BATTERY VOLTAGE: 3.09 V
MDC IDC SESS DTM: 20150528205116
MDC IDC SET LEADCHNL RV PACING PULSEWIDTH: 0.4 ms
MDC IDC SET LEADCHNL RV SENSING SENSITIVITY: 0.3 mV
MDC IDC SET ZONE DETECTION INTERVAL: 360 ms
MDC IDC SET ZONE DETECTION INTERVAL: 450 ms
MDC IDC STAT BRADY RV PERCENT PACED: 0.02 %
Zone Setting Detection Interval: 300 ms

## 2014-05-26 LAB — PACEMAKER DEVICE OBSERVATION

## 2014-05-26 NOTE — Patient Instructions (Signed)
Remote monitoring is used to monitor your pacemaker from home. This monitoring reduces the number of office visits required to check your device to one time per year. It allows [place] to keep an eye on the functioning of your device to ensure it is working properly. You are scheduled for a device check from home on 08-30-2014. You may send your transmission at any time that day. If you have a wireless device, the transmission will be sent automatically. After your physician reviews your transmission, you will receive a postcard with your next transmission date.  Your physician recommends that you schedule a follow-up appointment in: 12 months with Dr.Croitoru

## 2014-05-26 NOTE — Progress Notes (Signed)
Patient ID: Edward Haley, male   DOB: 12/19/1966, 48 y.o.   MRN: [id number]      Reason for office visit Ischemic cardiomyopathy, chronic systolic heart failure, ICD followup  Edward Haley is now roughly one month following implantation of a single-chamber defibrillator as primary prevention of sudden cardiac death in the setting of severe ischemic cardiomyopathy(MADIT-2). The surgical site has healed well and there is no evidence of hematoma or infection. Interrogation of the device today shows excellent lead parameters, normal battery status and the absence of any interim sustained or nonsustained arrhythmic events.  Device pacing outputs were changed to chronic settings. We took the opportunity to again review his "shock plan" and to discuss the technique for remote device monitoring. We also discussed thoracic impedance monitoring. He will be enrolled in care link with downloaded for 3 months and an office visit on a yearly basis. He'll benefit from the ICD support group meetings.  He had questions regarding transplantation or other advance heart failure therapies. At this point his functional status is good enough that he does not need to consider these advanced interventions, but they may become necessary in the future. Currently appears clinically euvolemic and NYHA class II   Allergies  Allergen Reactions  . Shellfish Allergy Itching and Swelling    Current Outpatient Prescriptions  Medication Sig Dispense Refill  . aspirin EC 325 MG tablet Take 325 mg by mouth daily.      . ATRIPLA 600-200-300 MG per tablet TAKE 1 TABLET BY MOUTH EVERY NIGHT AT BEDTIME  30 tablet  2  . carvedilol (COREG) 12.5 MG tablet Take 1 tablet (12.5 mg total) by mouth 2 (two) times daily with a meal.  60 tablet  11  . clopidogrel (PLAVIX) 75 MG tablet Take 1 tablet (75 mg total) by mouth daily with breakfast.  30 tablet  11  . diphenhydrAMINE (BENADRYL) 25 mg capsule Take 25 mg by mouth 2 (two) times daily.        Marland Kitchen dronabinol (MARINOL) 2.5 MG capsule Take 1 capsule (2.5 mg total) by mouth 2 (two) times daily before a meal.  180 capsule  1  . ENSURE PLUS (ENSURE PLUS) LIQD Take 237 mLs by mouth 2 (two) times daily between meals.      . gabapentin (NEURONTIN) 100 MG capsule Take 100 mg by mouth 3 (three) times daily.      Marland Kitchen lisinopril (ZESTRIL) 10 MG tablet Take 0.5 tablets (5 mg total) by mouth daily.  30 tablet  6  . methocarbamol (ROBAXIN) 500 MG tablet TAKE ONE TABLET BY MOUTH DAILY AT BEDTIME AS NEEDED FOR MUSCLE SPASMS  30 tablet  1  . Multiple Vitamin (MULTIVITAMIN WITH MINERALS) TABS tablet Take 1 tablet by mouth at bedtime.      . nitroGLYCERIN (NITROSTAT) 0.4 MG SL tablet Place 0.4 mg under the tongue every 5 (five) minutes as needed for chest pain.      . rosuvastatin (CRESTOR) 40 MG tablet Take 1 tablet (40 mg total) by mouth at bedtime.  90 tablet  3  . tadalafil (CIALIS) 20 MG tablet Take 1 tablet (20 mg total) by mouth daily as needed for erectile dysfunction.  30 tablet  3  . traMADol (ULTRAM) 50 MG tablet Take 1 tablet (50 mg total) by mouth every 8 (eight) hours as needed for moderate pain.  30 tablet  0  . Vitamin D, Ergocalciferol, (DRISDOL) 50000 UNITS CAPS capsule Take 1 capsule (50,000 Units total) by mouth every  7 (seven) days.  12 capsule  0   No current facility-administered medications for this visit.    Past Medical History  Diagnosis Date  . HIV infection dx'd 1993  . Hypertension   . Coronary artery disease   . Hyperlipidemia   . Automatic implantable cardioverter-defibrillator in situ   . CHF (congestive heart failure)   . MI (myocardial infarction) 2011  . GERD (gastroesophageal reflux disease)   . History of stomach ulcers   . Daily headache   . Migraine     "qd" (04/26/2014)  . Chronic lower back pain     Past Surgical History  Procedure Laterality Date  . Cardiac defibrillator placement  04/26/2014  . Coronary angioplasty with stent placement  03/11/2013     Dr Salomon Fick [facility] [place] - Promus Premiere Monorail 2.5x1mm to RCA & 2.5x93mm to RCA  . Coronary angioplasty with stent placement  02/11/2010    [facility] [place] - Multi-Link 5.0x81mm to LAD  . Tonsillectomy    . Cystectomy  1990's    "2 taken off my bottom" (04/26/2014)    Family History  Problem Relation Age of Onset  . Stroke Mother   . Hypertension Mother   . Diabetes Mother   . Heart disease Sister   . Cancer Sister     History   Social History  . Marital Status: Single    Spouse Name: N/A    Number of Children: N/A  . Years of Education: N/A   Occupational History  . Not on file.   Social History Main Topics  . Smoking status: Never Smoker   . Smokeless tobacco: Never Used  . Alcohol Use: No  . Drug Use: Yes    Special: Marijuana     Comment: 04/26/2014 "q 2-3 days"  . Sexual Activity: Yes    Partners: Female   Other Topics Concern  . Not on file   Social History Narrative  . No narrative on file    Review of systems: The patient specifically denies fever, chills, rash, chest pain at rest exertion, dyspnea at rest or with exertion, orthopnea, paroxysmal nocturnal dyspnea, syncope, palpitations, focal neurological deficits, intermittent claudication, lower extremity edema, unexplained weight gain, cough, hemoptysis or wheezing.   PHYSICAL EXAM BP 108/73  Pulse 65  Ht 6\' 1"  (1.854 m)  Wt 205 lb 11.2 oz (93.305 kg)  BMI 27.14 kg/m2 General: Alert, oriented x3, no distress  Head: no evidence of trauma, PERRL, EOMI, no exophtalmos or lid lag, no myxedema, no xanthelasma; normal ears, nose and oropharynx  Neck: normal jugular venous pulsations and no hepatojugular reflux; brisk carotid pulses without delay and no carotid bruits  Chest: clear to auscultation, no signs of consolidation by percussion or palpation, normal fremitus, symmetrical and full respiratory excursions  Cardiovascular: normal position and  quality of the apical impulse, regular rhythm, normal first heart sound and normal second heart sounds, no rubs or gallops, no murmur  Abdomen: no tenderness or distention, no masses by palpation, no abnormal pulsatility or arterial bruits, normal bowel sounds, no hepatosplenomegaly  Extremities: no clubbing, cyanosis or edema; 2+ radial, ulnar and brachial pulses bilaterally; 2+ right femoral, posterior tibial and dorsalis pedis pulses; 2+ left femoral, posterior tibial and dorsalis pedis pulses; no subclavian or femoral bruits  Neurological: grossly nonfocal   BMET    Component Value Date/Time   NA 140 04/21/2014 1716   K 4.1 04/21/2014 1716   CL 105 04/21/2014 1716  CO2 26 04/21/2014 1716   GLUCOSE 88 04/21/2014 1716   BUN 9 04/21/2014 1716   CREATININE 0.96 04/21/2014 1716   CALCIUM 9.3 04/21/2014 1716   GFRNONAA 87 02/28/2014 1104   GFRAA >89 02/28/2014 1104     ASSESSMENT AND PLAN Device pacing outputs were changed to chronic settings. We took the opportunity to again review his "shock plan" and to discuss the technique for remote device monitoring. We also discussed thoracic impedance monitoring. He will be enrolled in care link with downloaded for 3 months and an office visit on a yearly basis. He'll benefit from the ICD support group meetings.  He had questions regarding transplantation or other advance heart failure therapies. At this point his functional status is good enough that he does not need to consider these advanced interventions, but they may become necessary in the future. Currently appears clinically euvolemic and NYHA class II   Thurmon FairMihai Malynn Lucy, MD, [facility] [phone] office [phone] pager

## 2014-06-09 ENCOUNTER — Encounter: Payer: Self-pay | Admitting: Internal Medicine

## 2014-06-09 ENCOUNTER — Ambulatory Visit: Payer: No Typology Code available for payment source | Attending: Internal Medicine | Admitting: Internal Medicine

## 2014-06-09 VITALS — BP 112/78 | HR 66 | Temp 98.9°F | Resp 16 | Wt 209.0 lb

## 2014-06-09 DIAGNOSIS — I2510 Atherosclerotic heart disease of native coronary artery without angina pectoris: Secondary | ICD-10-CM | POA: Insufficient documentation

## 2014-06-09 DIAGNOSIS — K219 Gastro-esophageal reflux disease without esophagitis: Secondary | ICD-10-CM | POA: Insufficient documentation

## 2014-06-09 DIAGNOSIS — I509 Heart failure, unspecified: Secondary | ICD-10-CM | POA: Insufficient documentation

## 2014-06-09 DIAGNOSIS — E559 Vitamin D deficiency, unspecified: Secondary | ICD-10-CM | POA: Insufficient documentation

## 2014-06-09 DIAGNOSIS — I10 Essential (primary) hypertension: Secondary | ICD-10-CM | POA: Insufficient documentation

## 2014-06-09 DIAGNOSIS — B20 Human immunodeficiency virus [HIV] disease: Secondary | ICD-10-CM | POA: Insufficient documentation

## 2014-06-09 DIAGNOSIS — I2589 Other forms of chronic ischemic heart disease: Secondary | ICD-10-CM | POA: Insufficient documentation

## 2014-06-09 DIAGNOSIS — Z95810 Presence of automatic (implantable) cardiac defibrillator: Secondary | ICD-10-CM | POA: Insufficient documentation

## 2014-06-09 DIAGNOSIS — I252 Old myocardial infarction: Secondary | ICD-10-CM | POA: Insufficient documentation

## 2014-06-09 DIAGNOSIS — E785 Hyperlipidemia, unspecified: Secondary | ICD-10-CM | POA: Insufficient documentation

## 2014-06-09 NOTE — Progress Notes (Signed)
Patient here for routine follow up History of CAD and cardiomyopathy Recently had pace maker put in

## 2014-06-09 NOTE — Progress Notes (Signed)
MRN: [id number] Name: Edward Haley  Sex: male Age: 48 y.o. DOB: 1966/05/06  Allergies: Shellfish allergy  Chief Complaint  Patient presents with  . Follow-up    HPI: Patient is 48 y.o. male who has is she of CAD, HIV hyperlipidemia, ischemic cardiomyopathy patient recently had ICD placed denies any chest pain or shortness of breath following up with his cardiologist, his previous blood work reviewed with the patient noticed vitamin D deficiency as per patient is taking the supplement. Patient does not smoke cigarettes. Patient also has been following up with his infectious disease Dr for HIV.  Past Medical History  Diagnosis Date  . HIV infection dx'd 1993  . Hypertension   . Coronary artery disease   . Hyperlipidemia   . Automatic implantable cardioverter-defibrillator in situ   . CHF (congestive heart failure)   . MI (myocardial infarction) 2011  . GERD (gastroesophageal reflux disease)   . History of stomach ulcers   . Daily headache   . Migraine     "qd" (04/26/2014)  . Chronic lower back pain     Past Surgical History  Procedure Laterality Date  . Cardiac defibrillator placement  04/26/2014  . Coronary angioplasty with stent placement  03/11/2013    Dr Salomon Fick [facility] [place] - Promus Premiere Monorail 2.5x59mm to RCA & 2.5x32mm to RCA  . Coronary angioplasty with stent placement  02/11/2010    [facility], [place] - Multi-Link 5.0x67mm to LAD  . Tonsillectomy    . Cystectomy  1990's    "2 taken off my bottom" (04/26/2014)      Medication List       This list is accurate as of: 06/09/14 10:44 AM.  Always use your most recent med list.               aspirin EC 325 MG tablet  Take 325 mg by mouth daily.     ATRIPLA 600-200-300 MG per tablet  Generic drug:  efavirenz-emtricitabine-tenofovir  TAKE 1 TABLET BY MOUTH EVERY NIGHT AT BEDTIME     carvedilol 12.5 MG tablet  Commonly known as:  COREG  Take 1 tablet  (12.5 mg total) by mouth 2 (two) times daily with a meal.     clopidogrel 75 MG tablet  Commonly known as:  PLAVIX  Take 1 tablet (75 mg total) by mouth daily with breakfast.     diphenhydrAMINE 25 mg capsule  Commonly known as:  BENADRYL  Take 25 mg by mouth 2 (two) times daily.     dronabinol 2.5 MG capsule  Commonly known as:  MARINOL  Take 1 capsule (2.5 mg total) by mouth 2 (two) times daily before a meal.     ENSURE PLUS Liqd  Take 237 mLs by mouth 2 (two) times daily between meals.     gabapentin 100 MG capsule  Commonly known as:  NEURONTIN  Take 100 mg by mouth 3 (three) times daily.     lisinopril 10 MG tablet  Commonly known as:  ZESTRIL  Take 0.5 tablets (5 mg total) by mouth daily.     methocarbamol 500 MG tablet  Commonly known as:  ROBAXIN  TAKE ONE TABLET BY MOUTH DAILY AT BEDTIME AS NEEDED FOR MUSCLE SPASMS     multivitamin with minerals Tabs tablet  Take 1 tablet by mouth at bedtime.     nitroGLYCERIN 0.4 MG SL tablet  Commonly known as:  NITROSTAT  Place 0.4 mg under the  tongue every 5 (five) minutes as needed for chest pain.     rosuvastatin 40 MG tablet  Commonly known as:  CRESTOR  Take 1 tablet (40 mg total) by mouth at bedtime.     tadalafil 20 MG tablet  Commonly known as:  CIALIS  Take 1 tablet (20 mg total) by mouth daily as needed for erectile dysfunction.     traMADol 50 MG tablet  Commonly known as:  ULTRAM  Take 1 tablet (50 mg total) by mouth every 8 (eight) hours as needed for moderate pain.     Vitamin D (Ergocalciferol) 50000 UNITS Caps capsule  Commonly known as:  DRISDOL  Take 1 capsule (50,000 Units total) by mouth every 7 (seven) days.        No orders of the defined types were placed in this encounter.    Immunization History  Administered Date(s) Administered  . Hepatitis A 10/30/2005  . Influenza,inj,Quad PF,36+ Mos 11/11/2013  . Pneumococcal Conjugate-13 09/08/2012  . Pneumococcal Polysaccharide-23 01/10/2009    . Td 08/15/2003  . Tdap 01/10/2009    Family History  Problem Relation Age of Onset  . Stroke Mother   . Hypertension Mother   . Diabetes Mother   . Heart disease Sister   . Cancer Sister     History  Substance Use Topics  . Smoking status: Never Smoker   . Smokeless tobacco: Never Used  . Alcohol Use: No    Review of Systems   As noted in HPI  Filed Vitals:   06/09/14 1029  BP: 112/78  Pulse: 66  Temp: 98.9 F (37.2 C)  Resp: 16    Physical Exam  Physical Exam  Constitutional: No distress.  Eyes: EOM are normal. Pupils are equal, round, and reactive to light.  Cardiovascular: Normal rate and regular rhythm.   Pulmonary/Chest: Breath sounds normal. No respiratory distress. He has no wheezes. He has no rales.  S/c palpable ICD left side chest wall    CBC    Component Value Date/Time   WBC 5.3 04/21/2014 1716   RBC 4.86 04/21/2014 1716   HGB 14.5 04/21/2014 1716   HCT 42.7 04/21/2014 1716   PLT 177 04/21/2014 1716   MCV 87.9 04/21/2014 1716   LYMPHSABS 2.6 02/28/2014 1104   MONOABS 0.4 02/28/2014 1104   EOSABS 0.1 02/28/2014 1104   BASOSABS 0.0 02/28/2014 1104    CMP     Component Value Date/Time   NA 140 04/21/2014 1716   K 4.1 04/21/2014 1716   CL 105 04/21/2014 1716   CO2 26 04/21/2014 1716   GLUCOSE 88 04/21/2014 1716   BUN 9 04/21/2014 1716   CREATININE 0.96 04/21/2014 1716   CALCIUM 9.3 04/21/2014 1716   PROT 6.8 04/21/2014 1716   ALBUMIN 4.5 04/21/2014 1716   AST 22 04/21/2014 1716   ALT 17 04/21/2014 1716   ALKPHOS 57 04/21/2014 1716   BILITOT 0.3 04/21/2014 1716   GFRNONAA 87 02/28/2014 1104   GFRAA >89 02/28/2014 1104    No results found for this basename: chol, tri, ldl    No components found with this basename: hga1c    Lab Results  Component Value Date/Time   AST 22 04/21/2014  5:16 PM    Assessment and Plan  CAD (coronary artery disease)/ ICD (implantable cardioverter-defibrillator) in place  Patient is on aspirin Coreg, Plavix ACE  inhibitor statins. Patient following up with the cardiologist.  Unspecified vitamin D deficiency Continue with vitamin D supplement  HIV disease  Patient is on a trip planned following up with infectious disease.   Return in about 4 months (around 10/09/2014) for CAD.  Doris CheadleADVANI, Anessa Charley, MD

## 2014-06-22 ENCOUNTER — Encounter (HOSPITAL_COMMUNITY): Payer: Self-pay | Admitting: Emergency Medicine

## 2014-06-22 ENCOUNTER — Emergency Department (HOSPITAL_COMMUNITY)
Admission: EM | Admit: 2014-06-22 | Discharge: 2014-06-22 | Disposition: A | Discharge status: Home / Self Care | Payer: No Typology Code available for payment source | Attending: Emergency Medicine | Admitting: Emergency Medicine

## 2014-06-22 DIAGNOSIS — E785 Hyperlipidemia, unspecified: Secondary | ICD-10-CM | POA: Insufficient documentation

## 2014-06-22 DIAGNOSIS — G8929 Other chronic pain: Secondary | ICD-10-CM | POA: Insufficient documentation

## 2014-06-22 DIAGNOSIS — M545 Low back pain, unspecified: Secondary | ICD-10-CM | POA: Insufficient documentation

## 2014-06-22 DIAGNOSIS — K219 Gastro-esophageal reflux disease without esophagitis: Secondary | ICD-10-CM | POA: Insufficient documentation

## 2014-06-22 DIAGNOSIS — I252 Old myocardial infarction: Secondary | ICD-10-CM | POA: Insufficient documentation

## 2014-06-22 DIAGNOSIS — Z79899 Other long term (current) drug therapy: Secondary | ICD-10-CM | POA: Insufficient documentation

## 2014-06-22 DIAGNOSIS — G43909 Migraine, unspecified, not intractable, without status migrainosus: Secondary | ICD-10-CM | POA: Insufficient documentation

## 2014-06-22 DIAGNOSIS — Z7982 Long term (current) use of aspirin: Secondary | ICD-10-CM | POA: Insufficient documentation

## 2014-06-22 DIAGNOSIS — I2510 Atherosclerotic heart disease of native coronary artery without angina pectoris: Secondary | ICD-10-CM | POA: Insufficient documentation

## 2014-06-22 DIAGNOSIS — I509 Heart failure, unspecified: Secondary | ICD-10-CM | POA: Insufficient documentation

## 2014-06-22 DIAGNOSIS — Q245 Malformation of coronary vessels: Secondary | ICD-10-CM | POA: Insufficient documentation

## 2014-06-22 DIAGNOSIS — I10 Essential (primary) hypertension: Secondary | ICD-10-CM | POA: Insufficient documentation

## 2014-06-22 DIAGNOSIS — Z7902 Long term (current) use of antithrombotics/antiplatelets: Secondary | ICD-10-CM | POA: Insufficient documentation

## 2014-06-22 DIAGNOSIS — Z8719 Personal history of other diseases of the digestive system: Secondary | ICD-10-CM | POA: Insufficient documentation

## 2014-06-22 DIAGNOSIS — Z95810 Presence of automatic (implantable) cardiac defibrillator: Secondary | ICD-10-CM | POA: Insufficient documentation

## 2014-06-22 DIAGNOSIS — Z9861 Coronary angioplasty status: Secondary | ICD-10-CM | POA: Insufficient documentation

## 2014-06-22 DIAGNOSIS — Z21 Asymptomatic human immunodeficiency virus [HIV] infection status: Secondary | ICD-10-CM | POA: Insufficient documentation

## 2014-06-22 MED ORDER — HYDROCODONE-ACETAMINOPHEN 5-325 MG PO TABS: 1.0000 | ORAL_TABLET | Freq: Four times a day (QID) | ORAL | Status: DC | PRN

## 2014-06-22 MED ORDER — PREDNISONE 20 MG PO TABS
ORAL_TABLET | ORAL | Status: DC
Start: 2014-06-22 — End: 2014-11-14

## 2014-06-22 NOTE — ED Provider Notes (Signed)
CSN: [id number]     Arrival date & time 06/22/14  1518 History  This chart was scribed for Edward MuttonMarissa Sciacca, PA-C working with Ward GivensIva L Knapp, MD by Evon Slackerrance Branch, ED Scribe. This patient was seen in room TR10C/TR10C and the patient's care was started at 4:25 PM.      Chief Complaint  Patient presents with  . Back Pain   Patient is a 48 y.o. male presenting with back pain. The history is provided by the patient. No language interpreter was used.  Back Pain Associated symptoms: leg pain   Associated symptoms: no abdominal pain, no bladder incontinence, no bowel incontinence, no chest pain, no numbness and no tingling    Edward Haley is a [age] y.o. male who has a history of HIV, Hypertension, Coronary Artery Disease, Hyperlipidemia, CHF, MI, Migraines, Cardiac defibrillator placement and chronic lower back pain. He present to the ED complaining of sharp shooting chronic lower back pain 20 years prior. He states he first injured his back while playing baseball at [employer]. He states the pain recently worsened 2 days prior. He states she has tried muscle relaxer's with no relief. He states his pain is 8/10. Pt states that pain worsens with movement. He states the pain radiates down into his legs when he walks. Stated that he has been battling back pain for at least 20 years - stated that when he has these episodes of back pain it is always in the lower back, left side described as a sharp pain worse with motion. Reported that he has not been seen by a specialist - although his PCP recommended to be seen - stated that he was not referred to one. He denies having any new symptoms of abdominal pain, nausea, diarrhea, chest pain, SOB, urinary/bowel incontinence, or numbness.   Past Medical History  Diagnosis Date  . HIV infection dx'd 1993  . Hypertension   . Coronary artery disease   . Hyperlipidemia   . Automatic implantable cardioverter-defibrillator in situ   . CHF (congestive heart failure)   . MI  (myocardial infarction) 2011  . GERD (gastroesophageal reflux disease)   . History of stomach ulcers   . Daily headache   . Migraine     "qd" (04/26/2014)  . Chronic lower back pain    Past Surgical History  Procedure Laterality Date  . Cardiac defibrillator placement  04/26/2014  . Coronary angioplasty with stent placement  03/11/2013    [facility] [place] - Promus Premiere Monorail 2.5x2116mm to RCA & 2.5x920mm to RCA  . Coronary angioplasty with stent placement  02/11/2010    [facility] [place] [place] Multi-Link 5.0x3018mm to LAD  . Tonsillectomy    . Cystectomy  1990's    "2 taken off my bottom" (04/26/2014)   Family History  Problem Relation Age of Onset  . Stroke Mother   . Hypertension Mother   . Diabetes Mother   . Heart disease Sister   . Cancer Sister    History  Substance Use Topics  . Smoking status: Never Smoker   . Smokeless tobacco: Never Used  . Alcohol Use: No    Review of Systems  Respiratory: Negative for shortness of breath.   Cardiovascular: Negative for chest pain.  Gastrointestinal: Negative for nausea, abdominal pain, diarrhea and bowel incontinence.  Genitourinary: Negative for bladder incontinence.  Musculoskeletal: Positive for back pain.  Neurological: Negative for tingling and numbness.   Allergies  Shellfish allergy  Home  Medications   Prior to Admission medications   Medication Sig Start Date End Date Taking? Authorizing Provider  aspirin EC 325 MG tablet Take 325 mg by mouth daily.    Historical Provider, MD  ATRIPLA 600-200-300 MG per tablet TAKE 1 TABLET BY MOUTH EVERY NIGHT AT BEDTIME 05/17/14   [name], MD  carvedilol (COREG) 12.5 MG tablet Take 1 tablet (12.5 mg total) by mouth 2 (two) times daily with a meal. 11/11/13   [name], MD  clopidogrel (PLAVIX) 75 MG tablet Take 1 tablet (75 mg total) by mouth daily with breakfast. 11/11/13   [name], MD  diphenhydrAMINE  (BENADRYL) 25 mg capsule Take 25 mg by mouth 2 (two) times daily.     Historical Provider, MD  dronabinol (MARINOL) 2.5 MG capsule Take 1 capsule (2.5 mg total) by mouth 2 (two) times daily before a meal. 05/17/14   [name], MD  ENSURE PLUS (ENSURE PLUS) LIQD Take 237 mLs by mouth 2 (two) times daily between meals.    Historical Provider, MD  gabapentin (NEURONTIN) 100 MG capsule Take 100 mg by mouth 3 (three) times daily.    Historical Provider, MD  lisinopril (ZESTRIL) 10 MG tablet Take 0.5 tablets (5 mg total) by mouth daily. 11/11/13   [name], MD  methocarbamol (ROBAXIN) 500 MG tablet TAKE ONE TABLET BY MOUTH DAILY AT BEDTIME AS NEEDED FOR MUSCLE SPASMS 05/17/14   [name], MD  Multiple Vitamin (MULTIVITAMIN WITH MINERALS) TABS tablet Take 1 tablet by mouth at bedtime.    Historical Provider, MD  nitroGLYCERIN (NITROSTAT) 0.4 MG SL tablet Place 0.4 mg under the tongue every 5 (five) minutes as needed for chest pain.    Historical Provider, MD  rosuvastatin (CRESTOR) 40 MG tablet Take 1 tablet (40 mg total) by mouth at bedtime. 03/15/14   Jeanann Lewandowsky, MD  tadalafil (CIALIS) 20 MG tablet Take 1 tablet (20 mg total) by mouth daily as needed for erectile dysfunction. 03/17/14   [name], MD  traMADol (ULTRAM) 50 MG tablet Take 1 tablet (50 mg total) by mouth every 8 (eight) hours as needed for moderate pain. 03/11/14   [name], MD  Vitamin D, Ergocalciferol, (DRISDOL) 50000 UNITS CAPS capsule Take 1 capsule (50,000 Units total) by mouth every 7 (seven) days. 03/15/14   [name], MD   Triage Vitals: BP 128/87  Pulse 70  Temp(Src) 97.9 F (36.6 C) (Oral)  Resp 16  Wt 199 lb 12.8 oz (90.629 kg)  SpO2 98%  Physical Exam  Nursing note and vitals reviewed. Constitutional: He is oriented to person, place, and time. He appears well-developed and well-nourished. No distress.  HENT:  Head: Normocephalic and atraumatic.  Mouth/Throat: Oropharynx is clear and moist.  No oropharyngeal exudate.  Eyes: Conjunctivae and EOM are normal. Pupils are equal, round, and reactive to light. Right eye exhibits no discharge. Left eye exhibits no discharge.  Neck: Normal range of motion. Neck supple. No tracheal deviation present.  Negative neck stiffness Negative nuchal rigidity Negative cervical lymphadenopathy   Cardiovascular: Normal rate, regular rhythm and normal heart sounds.   Cap refill less than 3 seconds Negative swelling or pitting edema identified to lower extremities bilaterally  Pulmonary/Chest: Effort normal. No respiratory distress. He has no wheezes. He has no rales.  Patient is able to speak in full sentences without difficulty Negative use of accessory muscles Negative stridor  Musculoskeletal: Normal range of motion.       Back:  Negative deformities identified to  the spine. Discomfort upon palpation to the lumbosacral/coccyx region mid spinal and paravertebral regions bilaterally, left more so than right. Full ROM to upper and lower extremities without difficulty noted, negative ataxia noted.  Lymphadenopathy:    He has no cervical adenopathy.  Neurological: He is alert and oriented to person, place, and time. No cranial nerve deficit. He exhibits normal muscle tone. Coordination normal.  Cranial nerves III-XII grossly intact Strength 5+/5+ to upper and lower extremities bilaterally with resistance applied, equal distribution noted Equal grip strength bilaterally Sensation intact with differentiation to sharp and dull touch Negative saddle paresthesias bilaterally Fine motor skills intact Gait proper, proper balance - negative sway, negative drift, negative step-offs  Skin: Skin is warm and dry.  Psychiatric: He has a normal mood and affect. His behavior is normal.    ED Course  Procedures (including critical care time) DIAGNOSTIC STUDIES: Oxygen Saturation is 98% on RA, normal by my interpretation.    COORDINATION OF CARE:    Labs  Review Labs Reviewed - No data to display  Imaging Review No results found.   EKG Interpretation None      MDM   Final diagnoses:  Acute exacerbation of chronic low back pain   Filed Vitals:   06/22/14 1522  BP: 128/87  Pulse: 70  Temp: 97.9 F (36.6 C)  TempSrc: Oral  Resp: 16  Weight: 199 lb 12.8 oz (90.629 kg)  SpO2: 98%    I personally performed the services described in this documentation, which was scribed in my presence. The recorded information has been reviewed and is accurate.  This provider reviewed patient's chart. Patient has history of HIV. Patient's T. helper cell count is 1000, HIV RNA load is less than 20-last performed on 02/28/2014. Patient has history of coronary artery disease with defibrillator placement on April 2015. Patient history stent placement in February of 2011 and again in March of 2014. Patient presenting to the ED with lower back pain does been ongoing for the past 20 years or so. Patient reports that when he was younger he played baseball had numerous injuries, reported that has gotten older the pain has been constantly underlying with episodes of acute exacerbation. Discomfort is localized to the left side of the back described as a sharp shooting pain-reports when he has these bouts of pain this is exactly how the discomfort is explained. Stated he's never been seen or assessed by orthopedics-patient does have a primary care provider, when asked what the primary care provider recommended patient reports to PCP recommended specialist followup-reported that PCP never referred him to a specialist. Negative focal neurological deficits noted. Patient neurovascularly intact. Gait proper with-negative step-offs or sway. Denied new injury. Denied urinary bowel continence. Doubt epidural abscess. Doubt cauda equina. Suspicion to be acute exacerbation of chronic back pain. No new symptoms identified. No new injuries or falls documented. Patient stable,  afebrile. Patient not septic appearing. Discharged patient. Discharge patient with pain medications-discussed course, cautions, disposal technique. Discharged patient with referral to orthopedics and PCP. Discussed with patient proper insoles, back brace, back exercises. Discussed with patient water therapy. Discussed with patient to closely monitor symptoms and if symptoms are to worsen or change to report back to the ED - strict return instructions given.  Patient agreed to plan of care, understood, all questions answered.   [employer], PA-C 06/23/14 [phone]

## 2014-06-22 NOTE — ED Notes (Signed)
Pt presents to department for evaluation of chronic back pain. States no relief with muscle relaxer. 9/10 pain, increases with movement. Pt is alert and oriented x4.

## 2014-06-22 NOTE — Discharge Instructions (Signed)
Please call your doctor for a followup appointment within 24-48 hours. When you talk to your doctor please let them know that you were seen in the emergency department and have them acquire all of your records so that they can discuss the findings with you and formulate a treatment plan to fully care for your new and ongoing problems. Please call an appointment with primary care provider Please call and set-up an appointment with Orthopedics regarding ongoing back pain  Please rest and stay hydrated Please take medications on a full stomach Please take medications as prescribed rash on pain medications there is to be no drinking alcohol, driving, operating any heavy machinery if there is extra please dispose in a proper manner. Please do not take any extra Tylenol for this can lead to Tylenol overdose and liver issues peer Please obtain a back brace and proper insoles for distribution of weeks Please avoid discussions activity Please continue to monitor symptoms closely and if symptoms are to worsen or change (fever greater than 101, chills, chest pain, shortness of breath, difficulty breathing, numbness, tingling, worsening or changes to back pain, fall, injury, inability to control urine or bowel movements, loss of sensation or bowel movements, weakness) please report back to the ED immediately  Back Pain, Adult Low back pain is very common. About 1 in 5 people have back pain.The cause of low back pain is rarely dangerous. The pain often gets better over time.About half of people with a sudden onset of back pain feel better in just 2 weeks. About 8 in 10 people feel better by 6 weeks.  CAUSES Some common causes of back pain include:  Strain of the muscles or ligaments supporting the spine.  Wear and tear (degeneration) of the spinal discs.  Arthritis.  Direct injury to the back. DIAGNOSIS Most of the time, the direct cause of low back pain is not known.However, back pain can be treated  effectively even when the exact cause of the pain is unknown.Answering your caregiver's questions about your overall health and symptoms is one of the most accurate ways to make sure the cause of your pain is not dangerous. If your caregiver needs more information, he or she may order lab work or imaging tests (X-rays or MRIs).However, even if imaging tests show changes in your back, this usually does not require surgery. HOME CARE INSTRUCTIONS For many people, back pain returns.Since low back pain is rarely dangerous, it is often a condition that people can learn to [facility] their own.   Remain active. It is stressful on the back to sit or stand in one place. Do not sit, drive, or stand in one place for more than 30 minutes at a time. Take short walks on level surfaces as soon as pain allows.Try to increase the length of time you walk each day.  Do not stay in bed.Resting more than 1 or 2 days can delay your recovery.  Do not avoid exercise or work.Your body is made to move.It is not dangerous to be active, even though your back may hurt.Your back will likely heal faster if you return to being active before your pain is gone.  Pay attention to your body when you bend and lift. Many people have less discomfortwhen lifting if they bend their knees, keep the load close to their bodies,and avoid twisting. Often, the most comfortable positions are those that put less stress on your recovering back.  Find a comfortable position to sleep. Use a firm mattress and  lie on your side with your knees slightly bent. If you lie on your back, put a pillow under your knees.  Only take over-the-counter or prescription medicines as directed by your caregiver. Over-the-counter medicines to reduce pain and inflammation are often the most helpful.Your caregiver may prescribe muscle relaxant drugs.These medicines help dull your pain so you can more quickly return to your normal activities and healthy  exercise.  Put ice on the injured area.  Put ice in a plastic bag.  Place a towel between your skin and the bag.  Leave the ice on for 15-20 minutes, 03-04 times a day for the first 2 to 3 days. After that, ice and heat may be alternated to reduce pain and spasms.  Ask your caregiver about trying back exercises and gentle massage. This may be of some benefit.  Avoid feeling anxious or stressed.Stress increases muscle tension and can worsen back pain.It is important to recognize when you are anxious or stressed and learn ways to manage it.Exercise is a great option. SEEK MEDICAL CARE IF:  You have pain that is not relieved with rest or medicine.  You have pain that does not improve in 1 week.  You have new symptoms.  You are generally not feeling well. SEEK IMMEDIATE MEDICAL CARE IF:   You have pain that radiates from your back into your legs.  You develop new bowel or bladder control problems.  You have unusual weakness or numbness in your arms or legs.  You develop nausea or vomiting.  You develop abdominal pain.  You feel faint. Document Released: 12/16/2005 Document Revised: 06/16/2012 Document Reviewed: 05/06/2011 [facility] Patient Information 2015 [employer] [place]. This information is not intended to replace advice given to you by your health care provider. Make sure you discuss any questions you have with your health care provider.   Back Exercises Back exercises help treat and prevent back injuries. The goal of back exercises is to increase the strength of your abdominal and back muscles and the flexibility of your back. These exercises should be started when you no longer have back pain. Back exercises include:  Pelvic Tilt. Lie on your back with your knees bent. Tilt your pelvis until the lower part of your back is against the floor. Hold this position 5 to 10 sec and repeat 5 to 10 times.  Knee to Chest. Pull first 1 knee up against your chest and hold for 20 to  30 seconds, repeat this with the other knee, and then both knees. This may be done with the other leg straight or bent, whichever feels better.  Sit-Ups or Curl-Ups. Bend your knees 90 degrees. Start with tilting your pelvis, and do a partial, slow sit-up, lifting your trunk only 30 to 45 degrees off the floor. Take at least 2 to 3 seconds for each sit-up. Do not do sit-ups with your knees out straight. If partial sit-ups are difficult, simply do the above but with only tightening your abdominal muscles and holding it as directed.  Hip-Lift. Lie on your back with your knees flexed 90 degrees. Push down with your feet and shoulders as you raise your hips a couple inches off the floor; hold for 10 seconds, repeat 5 to 10 times.  Back arches. Lie on your stomach, propping yourself up on bent elbows. Slowly press on your hands, causing an arch in your low back. Repeat 3 to 5 times. Any initial stiffness and discomfort should lessen with repetition over time.  Shoulder-Lifts. Lie face  down with arms beside your body. Keep hips and torso pressed to floor as you slowly lift your head and shoulders off the floor. Do not overdo your exercises, especially in the beginning. Exercises may cause you some mild back discomfort which lasts for a few minutes; however, if the pain is more severe, or lasts for more than 15 minutes, do not continue exercises until you see your caregiver. Improvement with exercise therapy for back problems is slow.  See your caregivers for assistance with developing a proper back exercise program. Document Released: 01/23/2005 Document Revised: 03/09/2012 Document Reviewed: 10/17/2011 [facility] Patient Information 2015 [place], [place]. This information is not intended to replace advice given to you by your health care provider. Make sure you discuss any questions you have with your health care provider.

## 2014-06-24 NOTE — ED Provider Notes (Signed)
Medical screening examination/treatment/procedure(s) were performed by non-physician practitioner and as supervising physician I was immediately available for consultation/collaboration.   EKG Interpretation None      Iva Knapp, MD, FACEP   Iva L Knapp, MD 06/24/14 0703 

## 2014-07-01 ENCOUNTER — Encounter (HOSPITAL_COMMUNITY): Payer: Self-pay | Admitting: Emergency Medicine

## 2014-07-01 ENCOUNTER — Emergency Department (HOSPITAL_COMMUNITY)
Admission: EM | Admit: 2014-07-01 | Discharge: 2014-07-01 | Disposition: A | Discharge status: Home / Self Care | Payer: No Typology Code available for payment source | Attending: Emergency Medicine | Admitting: Emergency Medicine

## 2014-07-01 DIAGNOSIS — G8929 Other chronic pain: Secondary | ICD-10-CM | POA: Insufficient documentation

## 2014-07-01 DIAGNOSIS — G43909 Migraine, unspecified, not intractable, without status migrainosus: Secondary | ICD-10-CM | POA: Insufficient documentation

## 2014-07-01 DIAGNOSIS — M5416 Radiculopathy, lumbar region: Secondary | ICD-10-CM

## 2014-07-01 DIAGNOSIS — I252 Old myocardial infarction: Secondary | ICD-10-CM | POA: Insufficient documentation

## 2014-07-01 DIAGNOSIS — Z7982 Long term (current) use of aspirin: Secondary | ICD-10-CM | POA: Insufficient documentation

## 2014-07-01 DIAGNOSIS — E785 Hyperlipidemia, unspecified: Secondary | ICD-10-CM | POA: Insufficient documentation

## 2014-07-01 DIAGNOSIS — M545 Low back pain, unspecified: Secondary | ICD-10-CM | POA: Insufficient documentation

## 2014-07-01 DIAGNOSIS — Z95810 Presence of automatic (implantable) cardiac defibrillator: Secondary | ICD-10-CM | POA: Insufficient documentation

## 2014-07-01 DIAGNOSIS — Z9861 Coronary angioplasty status: Secondary | ICD-10-CM | POA: Insufficient documentation

## 2014-07-01 DIAGNOSIS — Z21 Asymptomatic human immunodeficiency virus [HIV] infection status: Secondary | ICD-10-CM | POA: Insufficient documentation

## 2014-07-01 DIAGNOSIS — Z79899 Other long term (current) drug therapy: Secondary | ICD-10-CM | POA: Insufficient documentation

## 2014-07-01 DIAGNOSIS — I509 Heart failure, unspecified: Secondary | ICD-10-CM | POA: Insufficient documentation

## 2014-07-01 DIAGNOSIS — Z7902 Long term (current) use of antithrombotics/antiplatelets: Secondary | ICD-10-CM | POA: Insufficient documentation

## 2014-07-01 DIAGNOSIS — I2510 Atherosclerotic heart disease of native coronary artery without angina pectoris: Secondary | ICD-10-CM | POA: Insufficient documentation

## 2014-07-01 MED ORDER — HYDROCODONE-ACETAMINOPHEN 5-325 MG PO TABS: 1.0000 | ORAL_TABLET | ORAL | Status: DC | PRN

## 2014-07-01 NOTE — Discharge Instructions (Signed)
Read the information below.  Use the prescribed medication as directed.  Please discuss all new medications with your pharmacist.  Do not take additional tylenol while taking the prescribed pain medication to avoid overdose.  You may return to the Emergency Department at any time for worsening condition or any new symptoms that concern you.   If you develop fevers, loss of control of bowel or bladder, weakness or numbness in your legs, or are unable to walk, return to the ER for a recheck.  ° °Chronic Back Pain ° When back pain lasts longer than 3 months, it is called chronic back pain. People with chronic back pain often go through certain periods that are more intense (flare-ups).  °CAUSES °Chronic back pain can be caused by wear and tear (degeneration) on different structures in your back. These structures include: °· The bones of your spine (vertebrae) and the joints surrounding your spinal cord and nerve roots (facets). °· The strong, fibrous tissues that connect your vertebrae (ligaments). °Degeneration of these structures may result in pressure on your nerves. This can lead to constant pain. °HOME CARE INSTRUCTIONS °· Avoid bending, heavy lifting, prolonged sitting, and activities which make the problem worse. °· Take brief periods of rest throughout the day to reduce your pain. Lying down or standing usually is better than sitting while you are resting. °· Take over-the-counter or prescription medicines only as directed by your caregiver. °SEEK IMMEDIATE MEDICAL CARE IF:  °· You have weakness or numbness in one of your legs or feet. °· You have trouble controlling your bladder or bowels. °· You have nausea, vomiting, abdominal pain, shortness of breath, or fainting. °Document Released: 01/23/2005 Document Revised: 03/09/2012 Document Reviewed: 11/30/2011 °[employer]® Patient Information ©2015 [employer]. This information is not intended to replace advice given to you by your health care provider. Make sure  you discuss any questions you have with your health care provider. ° °

## 2014-07-01 NOTE — ED Provider Notes (Signed)
CSN: [id number]     Arrival date & time 07/01/14  1042 History  This chart was scribed for non-physician practitioner, Trixie Dredge, PA-C,working with Raeford Razor, MD, by Karle Plumber, ED Scribe.  This patient was seen in room TR07C/TR07C and the patient's care was started at 10:59 AM.  Chief Complaint  Patient presents with  . Back Pain   The history is provided by the patient. No language interpreter was used.   HPI Comments:  Edward Haley is a 48 y.o. Male with h/o HIV, HTN, CAD, and chronic back pain who presents to the Emergency Department complaining of chronic lower back pain that is now a shooting, burning pain down to his right hip that started worsening six days ago. He states he was moving six days ago and started experiencing the worsening pain since then. Pt states walking makes the pain worse. He states he was prescribed a muscle relaxer and Vicodin and has an appointment with a specialist for his chronic back pain at the end of this month. He denies fever, chills, SOB, CP, urinary or bowel incontinence, decreased urination, abdominal pain or dysuria, frequency or urgency, testicular pain or swelling. Pt's PCP is Dr Orpah Cobb.  States his PCP has given him muscle relaxers for his chronic back pain but these don't help.    Past Medical History  Diagnosis Date  . HIV infection dx'd 1993  . Hypertension   . Coronary artery disease   . Hyperlipidemia   . Automatic implantable cardioverter-defibrillator in situ   . CHF (congestive heart failure)   . MI (myocardial infarction) 2011  . GERD (gastroesophageal reflux disease)   . History of stomach ulcers   . Daily headache   . Migraine     "qd" (04/26/2014)  . Chronic lower back pain    Past Surgical History  Procedure Laterality Date  . Cardiac defibrillator placement  04/26/2014  . Coronary angioplasty with stent placement  03/11/2013    Dr Salomon [facility] [place] - Promus Premiere Monorail 2.5x35mm to  RCA & 2.5x25mm to RCA  . Coronary angioplasty with stent placement  02/11/2010    [facility], [place] - Multi-Link 5.0x37mm to LAD  . Tonsillectomy    . Cystectomy  1990's    "2 taken off my bottom" (04/26/2014)   Family History  Problem Relation Age of Onset  . Stroke Mother   . Hypertension Mother   . Diabetes Mother   . Heart disease Sister   . Cancer Sister    History  Substance Use Topics  . Smoking status: Never Smoker   . Smokeless tobacco: Never Used  . Alcohol Use: No    Review of Systems A complete 10 system review of systems was obtained and all systems are negative except as noted in the HPI and PMH.   Allergies  Shellfish allergy  Home Medications   Prior to Admission medications   Medication Sig Start Date End Date Taking? Authorizing Provider  aspirin EC 325 MG tablet Take 325 mg by mouth daily.    Historical Provider, MD  ATRIPLA 600-200-300 MG per tablet TAKE 1 TABLET BY MOUTH EVERY NIGHT AT BEDTIME 05/17/14   [name], MD  carvedilol (COREG) 12.5 MG tablet Take 1 tablet (12.5 mg total) by mouth 2 (two) times daily with a meal. 11/11/13   [name], MD  clopidogrel (PLAVIX) 75 MG tablet Take 1 tablet (75 mg total) by mouth daily with breakfast. 11/11/13  Judyann Munsonynthia Snider, MD  diphenhydrAMINE (BENADRYL) 25 mg capsule Take 25 mg by mouth 2 (two) times daily.     Historical Provider, MD  dronabinol (MARINOL) 2.5 MG capsule Take 1 capsule (2.5 mg total) by mouth 2 (two) times daily before a meal. 05/17/14   Doris Cheadleeepak Advani, MD  ENSURE PLUS (ENSURE PLUS) LIQD Take 237 mLs by mouth 2 (two) times daily between meals.    Historical Provider, MD  gabapentin (NEURONTIN) 100 MG capsule Take 100 mg by mouth 3 (three) times daily.    Historical Provider, MD  HYDROcodone-acetaminophen (NORCO/VICODIN) 5-325 MG per tablet Take 1 tablet by mouth every 6 (six) hours as needed for moderate pain or severe pain. 06/22/14   Marissa Sciacca, PA-C   HYDROcodone-acetaminophen (NORCO/VICODIN) 5-325 MG per tablet Take 1 tablet by mouth every 4 (four) hours as needed for moderate pain or severe pain. 07/01/14   Trixie DredgeEmily Gladstone Rosas, PA-C  lisinopril (ZESTRIL) 10 MG tablet Take 0.5 tablets (5 mg total) by mouth daily. 11/11/13   Judyann Munsonynthia Snider, MD  methocarbamol (ROBAXIN) 500 MG tablet TAKE ONE TABLET BY MOUTH DAILY AT BEDTIME AS NEEDED FOR MUSCLE SPASMS 05/17/14   Doris Cheadleeepak Advani, MD  Multiple Vitamin (MULTIVITAMIN WITH MINERALS) TABS tablet Take 1 tablet by mouth at bedtime.    Historical Provider, MD  nitroGLYCERIN (NITROSTAT) 0.4 MG SL tablet Place 0.4 mg under the tongue every 5 (five) minutes as needed for chest pain.    Historical Provider, MD  predniSONE (DELTASONE) 20 MG tablet 3 tabs po day one, then 2 tabs daily x 4 days 06/22/14   Marissa Sciacca, PA-C  rosuvastatin (CRESTOR) 40 MG tablet Take 1 tablet (40 mg total) by mouth at bedtime. 03/15/14   Jeanann Lewandowskylugbemiga Jegede, MD  tadalafil (CIALIS) 20 MG tablet Take 1 tablet (20 mg total) by mouth daily as needed for erectile dysfunction. 03/17/14   Doris Cheadleeepak Advani, MD  traMADol (ULTRAM) 50 MG tablet Take 1 tablet (50 mg total) by mouth every 8 (eight) hours as needed for moderate pain. 03/11/14   Doris Cheadleeepak Advani, MD  Vitamin D, Ergocalciferol, (DRISDOL) 50000 UNITS CAPS capsule Take 1 capsule (50,000 Units total) by mouth every 7 (seven) days. 03/15/14   Doris Cheadleeepak Advani, MD   Triage Vitals: BP 118/85  Pulse 76  Temp(Src) 97.9 F (36.6 C) (Oral)  Resp 20  Ht 6\' 1"  (1.854 m)  Wt 198 lb (89.812 kg)  BMI 26.13 kg/m2  SpO2 100% Physical Exam  Nursing note and vitals reviewed. Constitutional: He appears well-developed and well-nourished. No distress.  HENT:  Head: Normocephalic and atraumatic.  Neck: Neck supple.  Pulmonary/Chest: Effort normal.  Abdominal: Soft. He exhibits no distension and no mass. There is no tenderness. There is no rebound and no guarding.  Musculoskeletal:  Spine nontender, no crepitus, or  stepoffs. Right hip nontender. Passive ROM of right hip without pain. Increased pain with straight leg raise on right only.   Neurological: He is alert.  Lower extremities:  Strength 5/5, sensation intact, distal pulses intact. Gait is limping.    Skin: He is not diaphoretic.    ED Course  Procedures (including critical care time) DIAGNOSTIC STUDIES: Oxygen Saturation is 100% on RA, normal by my interpretation.   COORDINATION OF CARE: 11:08 AM- Will prescribe pain medication. Pt verbalizes understanding and agrees to plan.  Medications - No data to display  Labs Review Labs Reviewed - No data to display  Imaging Review No results found.   EKG Interpretation None  [facility] databse shows #9 vicodin 5-325 given on 06/23/14, Marinol 2.5mg  #180, #60 filled in [place], two other prescriptions in March.   MDM   Final diagnoses:  Chronic radicular low back pain   Afebrile nontoxic patient with exacerbation of his chronic low back pain with right sided radiculopathy.  No red flags.  Has PCP, has specialist follow up later this month.  Encouraged to follow up with both of these doctors for pain management and further evaluation.  Pt has multiple medical problems- reports no other problems and reports taking his medications as prescribed. Discussed findings, treatment, and follow up  with patient.  Pt given return precautions.  Pt verbalizes understanding and agrees with plan.       I personally performed the services described in this documentation, which was scribed in my presence. The recorded information has been reviewed and is accurate.    Trixie Dredge, PA-C 07/01/14 1132

## 2014-07-01 NOTE — ED Notes (Addendum)
Per pt sts right sided hip pain and back pain. sts for decades. sts has taken percocet and Vicodin in the past and valium. sts the medication seems to help.

## 2014-07-01 NOTE — ED Notes (Signed)
Pt has hx of chronic back pain. States has an appointment "July 23rd" but does not remember with whom. Here for pain meds.

## 2014-07-03 NOTE — ED Provider Notes (Signed)
Medical screening examination/treatment/procedure(s) were performed by non-physician practitioner and as supervising physician I was immediately available for consultation/collaboration.   EKG Interpretation None       Raeford Razor, MD 07/03/14 1101

## 2014-07-14 ENCOUNTER — Encounter: Payer: No Typology Code available for payment source | Admitting: Cardiovascular Disease

## 2014-07-19 ENCOUNTER — Ambulatory Visit: Payer: No Typology Code available for payment source | Admitting: Internal Medicine

## 2014-07-25 ENCOUNTER — Ambulatory Visit (INDEPENDENT_AMBULATORY_CARE_PROVIDER_SITE_OTHER): Payer: No Typology Code available for payment source | Admitting: Internal Medicine

## 2014-07-25 ENCOUNTER — Encounter: Payer: Self-pay | Admitting: Internal Medicine

## 2014-07-25 VITALS — BP 117/87 | HR 65 | Temp 97.8°F | Ht 73.0 in | Wt 195.5 lb

## 2014-07-25 DIAGNOSIS — Z0000 Encounter for general adult medical examination without abnormal findings: Secondary | ICD-10-CM

## 2014-07-25 DIAGNOSIS — M25559 Pain in unspecified hip: Secondary | ICD-10-CM

## 2014-07-25 DIAGNOSIS — I259 Chronic ischemic heart disease, unspecified: Secondary | ICD-10-CM

## 2014-07-25 DIAGNOSIS — Z79899 Other long term (current) drug therapy: Secondary | ICD-10-CM

## 2014-07-25 DIAGNOSIS — K089 Disorder of teeth and supporting structures, unspecified: Secondary | ICD-10-CM

## 2014-07-25 DIAGNOSIS — M25551 Pain in right hip: Secondary | ICD-10-CM

## 2014-07-25 DIAGNOSIS — B20 Human immunodeficiency virus [HIV] disease: Secondary | ICD-10-CM

## 2014-07-25 DIAGNOSIS — Z113 Encounter for screening for infections with a predominantly sexual mode of transmission: Secondary | ICD-10-CM

## 2014-07-25 LAB — COMPLETE METABOLIC PANEL WITH GFR
ALBUMIN: 4.6 g/dL (ref 3.5–5.2)
ALT: 15 U/L (ref 0–53)
AST: 20 U/L (ref 0–37)
Alkaline Phosphatase: 57 U/L (ref 39–117)
BILIRUBIN TOTAL: 0.3 mg/dL (ref 0.2–1.2)
BUN: 10 mg/dL (ref 6–23)
CO2: 31 meq/L (ref 19–32)
Calcium: 9.5 mg/dL (ref 8.4–10.5)
Chloride: 104 mEq/L (ref 96–112)
Creat: 1.08 mg/dL (ref 0.50–1.35)
GFR, EST NON AFRICAN AMERICAN: 80 mL/min
Glucose, Bld: 82 mg/dL (ref 70–99)
POTASSIUM: 4.5 meq/L (ref 3.5–5.3)
Sodium: 140 mEq/L (ref 135–145)
TOTAL PROTEIN: 7 g/dL (ref 6.0–8.3)

## 2014-07-25 LAB — CBC WITH DIFFERENTIAL/PLATELET
Basophils Absolute: 0 10*3/uL (ref 0.0–0.1)
Basophils Relative: 0 % (ref 0–1)
EOS ABS: 0.1 10*3/uL (ref 0.0–0.7)
Eosinophils Relative: 2 % (ref 0–5)
HEMATOCRIT: 42 % (ref 39.0–52.0)
HEMOGLOBIN: 14.7 g/dL (ref 13.0–17.0)
Lymphocytes Relative: 53 % — ABNORMAL HIGH (ref 12–46)
Lymphs Abs: 2.1 10*3/uL (ref 0.7–4.0)
MCH: 30.6 pg (ref 26.0–34.0)
MCHC: 35 g/dL (ref 30.0–36.0)
MCV: 87.5 fL (ref 78.0–100.0)
MONOS PCT: 10 % (ref 3–12)
Monocytes Absolute: 0.4 10*3/uL (ref 0.1–1.0)
NEUTROS PCT: 35 % — AB (ref 43–77)
Neutro Abs: 1.4 10*3/uL — ABNORMAL LOW (ref 1.7–7.7)
Platelets: 154 10*3/uL (ref 150–400)
RBC: 4.8 MIL/uL (ref 4.22–5.81)
RDW: 15.3 % (ref 11.5–15.5)
WBC: 4 10*3/uL (ref 4.0–10.5)

## 2014-07-25 LAB — LIPID PANEL
CHOLESTEROL: 147 mg/dL (ref 0–200)
HDL: 53 mg/dL (ref >39)
LDL Cholesterol: 79 mg/dL (ref 0–99)
TRIGLYCERIDES: 75 mg/dL (ref <150)
Total CHOL/HDL Ratio: 2.8 Ratio
VLDL: 15 mg/dL (ref 0–40)

## 2014-07-25 MED ORDER — TRAMADOL HCL 50 MG PO TABS: 50.0000 mg | ORAL_TABLET | Freq: Two times a day (BID) | ORAL | Status: DC | PRN

## 2014-07-25 NOTE — Progress Notes (Signed)
Subjective:    Patient ID: Edward Haley, male    DOB: 21-Aug-1966, 48 y.o.   MRN: [id number]  HPI  48yo M with HIV disease, CD 4 count of 1000/VL<20, on atripla. Reports good adherence. He still reports having right sided flank pain from his fall back in the springtime. Goes to emergency room when the pain is too much for him to handle. No other illness. Here at clinic with his new girlfriend, (coincidentally one of my patients)  Current Outpatient Prescriptions on File Prior to Visit  Medication Sig Dispense Refill  . aspirin EC 325 MG tablet Take 325 mg by mouth daily.      . ATRIPLA 600-200-300 MG per tablet TAKE 1 TABLET BY MOUTH EVERY NIGHT AT BEDTIME  30 tablet  2  . carvedilol (COREG) 12.5 MG tablet Take 1 tablet (12.5 mg total) by mouth 2 (two) times daily with a meal.  60 tablet  11  . clopidogrel (PLAVIX) 75 MG tablet Take 1 tablet (75 mg total) by mouth daily with breakfast.  30 tablet  11  . diphenhydrAMINE (BENADRYL) 25 mg capsule Take 25 mg by mouth 2 (two) times daily.       Marland Kitchen dronabinol (MARINOL) 2.5 MG capsule Take 1 capsule (2.5 mg total) by mouth 2 (two) times daily before a meal.  180 capsule  1  . gabapentin (NEURONTIN) 100 MG capsule Take 100 mg by mouth 3 (three) times daily.      Marland Kitchen HYDROcodone-acetaminophen (NORCO/VICODIN) 5-325 MG per tablet Take 1 tablet by mouth every 6 (six) hours as needed for moderate pain or severe pain.  9 tablet  0  . lisinopril (ZESTRIL) 10 MG tablet Take 0.5 tablets (5 mg total) by mouth daily.  30 tablet  6  . methocarbamol (ROBAXIN) 500 MG tablet TAKE ONE TABLET BY MOUTH DAILY AT BEDTIME AS NEEDED FOR MUSCLE SPASMS  30 tablet  1  . Multiple Vitamin (MULTIVITAMIN WITH MINERALS) TABS tablet Take 1 tablet by mouth at bedtime.      . nitroGLYCERIN (NITROSTAT) 0.4 MG SL tablet Place 0.4 mg under the tongue every 5 (five) minutes as needed for chest pain.      . rosuvastatin (CRESTOR) 40 MG tablet Take 1 tablet (40 mg total) by mouth at  bedtime.  90 tablet  3  . tadalafil (CIALIS) 20 MG tablet Take 1 tablet (20 mg total) by mouth daily as needed for erectile dysfunction.  30 tablet  3  . ENSURE PLUS (ENSURE PLUS) LIQD Take 237 mLs by mouth 2 (two) times daily between meals.      Marland Kitchen HYDROcodone-acetaminophen (NORCO/VICODIN) 5-325 MG per tablet Take 1 tablet by mouth every 4 (four) hours as needed for moderate pain or severe pain.  15 tablet  0  . predniSONE (DELTASONE) 20 MG tablet 3 tabs po day one, then 2 tabs daily x 4 days  11 tablet  0  . traMADol (ULTRAM) 50 MG tablet Take 1 tablet (50 mg total) by mouth every 8 (eight) hours as needed for moderate pain.  30 tablet  0  . Vitamin D, Ergocalciferol, (DRISDOL) 50000 UNITS CAPS capsule Take 1 capsule (50,000 Units total) by mouth every 7 (seven) days.  12 capsule  0   No current facility-administered medications on file prior to visit.   Active Ambulatory Problems    Diagnosis Date Noted  . Human immunodeficiency virus (HIV) disease 10/13/2013  . Viral syndrome 12/10/2013  . CAD (coronary artery disease) 02/14/2014  .  Ischemic cardiomyopathy, EF 25-30% 02/28/14 02/14/2014  . 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 Old anterior myocardial infarction 02/14/2014  . ED (erectile dysfunction) 03/11/2014  . Hip pain 03/11/2014  . Chronic low back pain 03/11/2014  . ICD (implantable cardioverter-defibrillator) in place 04/26/2014  . HLD (hyperlipidemia) 04/27/2014  . Unspecified vitamin D deficiency 06/09/2014   Resolved Ambulatory Problems    Diagnosis Date Noted  . No Resolved Ambulatory Problems   Past Medical History  Diagnosis Date  . HIV infection dx'd 1993  . Hypertension   . Coronary artery disease   . Hyperlipidemia   . Automatic implantable cardioverter-defibrillator in situ   . CHF (congestive heart failure)   . MI (myocardial infarction) 2011  . GERD (gastroesophageal reflux disease)   . History of stomach ulcers   . Daily  headache   . Migraine   . Chronic lower back pain      Review of Systems     Objective:   Physical Exam BP 117/87  Pulse 65  Temp(Src) 97.8 F (36.6 C) (Oral)  Ht 6\' 1"  (1.854 m)  Wt 195 lb 8 oz (88.678 kg)  BMI 25.80 kg/m2 Physical Exam  Constitutional: He is oriented to person, place, and time. He appears well-developed and well-nourished. No distress.  HENT: poor dentition Mouth/Throat: Oropharynx is clear and moist. No oropharyngeal exudate.  Cardiovascular: Normal rate, regular rhythm and normal heart sounds. Exam reveals no gallop and no friction rub.  No murmur heard.  Pulmonary/Chest: Effort normal and breath sounds normal. No respiratory distress. He has no wheezes.  Abdominal: Soft. Bowel sounds are normal. He exhibits no distension. There is no tenderness.  Lymphadenopathy:  He has no cervical adenopathy.  Neurological: He is alert and oriented to person, place, and time.  Skin: Skin is warm and dry. No rash noted. No erythema.  Psychiatric: He has a normal mood and affect. His behavior is normal.       Assessment & Plan:  HIV= appears well controlled, will check labs. Tolerating atripla for now. Continue with this regimen  Health maintenance =will get flu in the Fall, will check rpr and lipids today  Hip/flank pain = will do 1 rx of tramadol until we can get him with a provider, in the midst of applying for medicaid  Cad= being followed by cardiology. Appears taking all the correct medicines  Poor dentition = does not appear infected, refer to dentist  rtc in 3months

## 2014-07-26 LAB — T-HELPER CELL (CD4) - (RCID CLINIC ONLY)
CD4 T CELL HELPER: 39 % (ref 33–55)
CD4 T Cell Abs: 850 /uL (ref 400–2700)

## 2014-07-26 LAB — RPR

## 2014-07-27 LAB — HIV-1 RNA QUANT-NO REFLEX-BLD
HIV 1 RNA Quant: <20 copies/mL (ref <20)
HIV-1 RNA Quant, Log: <1.3 {Log} (ref <1.30)

## 2014-08-08 ENCOUNTER — Other Ambulatory Visit: Payer: Self-pay | Admitting: Internal Medicine

## 2014-08-11 ENCOUNTER — Other Ambulatory Visit: Payer: Self-pay | Admitting: Internal Medicine

## 2014-08-15 ENCOUNTER — Other Ambulatory Visit (HOSPITAL_COMMUNITY): Payer: Self-pay

## 2014-08-15 ENCOUNTER — Telehealth (HOSPITAL_COMMUNITY): Payer: Self-pay | Admitting: *Deleted

## 2014-08-15 DIAGNOSIS — I255 Ischemic cardiomyopathy: Secondary | ICD-10-CM

## 2014-08-17 ENCOUNTER — Ambulatory Visit (HOSPITAL_COMMUNITY)
Admission: RE | Admit: 2014-08-17 | Discharge: 2014-08-17 | Disposition: A | Discharge status: Home / Self Care | Payer: No Typology Code available for payment source | Source: Ambulatory Visit | Attending: Internal Medicine | Admitting: Internal Medicine

## 2014-08-17 DIAGNOSIS — I2589 Other forms of chronic ischemic heart disease: Secondary | ICD-10-CM | POA: Insufficient documentation

## 2014-08-17 DIAGNOSIS — I519 Heart disease, unspecified: Secondary | ICD-10-CM | POA: Insufficient documentation

## 2014-08-17 DIAGNOSIS — I369 Nonrheumatic tricuspid valve disorder, unspecified: Secondary | ICD-10-CM

## 2014-08-17 DIAGNOSIS — I255 Ischemic cardiomyopathy: Secondary | ICD-10-CM

## 2014-08-17 NOTE — Progress Notes (Signed)
2D Echo Performed 08/17/2014    Elber Galyean, RCS  

## 2014-08-25 ENCOUNTER — Telehealth: Payer: Self-pay | Admitting: Cardiovascular Disease

## 2014-08-29 DIAGNOSIS — I2589 Other forms of chronic ischemic heart disease: Secondary | ICD-10-CM

## 2014-08-30 ENCOUNTER — Ambulatory Visit (INDEPENDENT_AMBULATORY_CARE_PROVIDER_SITE_OTHER): Payer: No Typology Code available for payment source | Admitting: *Deleted

## 2014-08-30 ENCOUNTER — Telehealth: Payer: Self-pay | Admitting: Cardiology

## 2014-08-30 ENCOUNTER — Telehealth: Payer: Self-pay | Admitting: Cardiovascular Disease

## 2014-08-30 NOTE — Telephone Encounter (Signed)
LMOVM reminding pt to send remote transmission.   

## 2014-08-30 NOTE — Telephone Encounter (Signed)
Patient states that he has a monitor and the transmissions have not been going thru because it has not been registered.  Please let him know when this has been completed.

## 2014-08-30 NOTE — Telephone Encounter (Signed)
Message forwarded to device clinic pool

## 2014-08-31 ENCOUNTER — Encounter: Payer: Self-pay | Admitting: Cardiology

## 2014-08-31 NOTE — Telephone Encounter (Signed)
Attempted to call pt x2 both numbers are not available. Will try to call pt again later today.

## 2014-08-31 NOTE — Telephone Encounter (Signed)
Spoke with pt and informed him that I needed serial number off his monitor. Pt was not at home and will call back later when he is at home.

## 2014-09-02 NOTE — Telephone Encounter (Signed)
Attempted to call pt back but both numbers are not available.

## 2014-09-09 ENCOUNTER — Telehealth: Payer: Self-pay | Admitting: Cardiovascular Disease

## 2014-09-09 LAB — MDC_IDC_ENUM_SESS_TYPE_REMOTE
Battery Remaining Longevity: 135 mo
Battery Voltage: 3.05 V
Brady Statistic RV Percent Paced: 0.01 %
Date Time Interrogation Session: 20150831124457
HIGH POWER IMPEDANCE MEASURED VALUE: 190 Ohm
HIGH POWER IMPEDANCE MEASURED VALUE: 59 Ohm
Lead Channel Impedance Value: 475 Ohm
Lead Channel Pacing Threshold Pulse Width: 0.4 ms
Lead Channel Sensing Intrinsic Amplitude: 26 mV
Lead Channel Setting Pacing Amplitude: 2.5 V
Lead Channel Setting Pacing Pulse Width: 0.4 ms
Lead Channel Setting Sensing Sensitivity: 0.3 mV
MDC IDC MSMT LEADCHNL RV PACING THRESHOLD AMPLITUDE: 0.625 V
MDC IDC MSMT LEADCHNL RV SENSING INTR AMPL: 26 mV
MDC IDC SET ZONE DETECTION INTERVAL: 300 ms
MDC IDC SET ZONE DETECTION INTERVAL: 360 ms
Zone Setting Detection Interval: 450 ms

## 2014-09-09 NOTE — Telephone Encounter (Signed)
Called and gave [employer] pt serial number. Received pt transmission in [employer].

## 2014-09-09 NOTE — Progress Notes (Signed)
Remote ICD transmission.   

## 2014-09-09 NOTE — Telephone Encounter (Signed)
New message     Calling to give serial number on device so that we can set it up to be monitored.  It is [id number]

## 2014-09-20 ENCOUNTER — Encounter: Payer: Self-pay | Admitting: Cardiology

## 2014-09-22 ENCOUNTER — Telehealth: Payer: Self-pay | Admitting: Internal Medicine

## 2014-09-23 ENCOUNTER — Encounter: Payer: Self-pay | Admitting: Cardiovascular Disease

## 2014-09-23 NOTE — Telephone Encounter (Signed)
Close encounter 

## 2014-09-28 ENCOUNTER — Telehealth: Payer: Self-pay | Admitting: Internal Medicine

## 2014-09-28 ENCOUNTER — Other Ambulatory Visit: Payer: Self-pay

## 2014-09-28 DIAGNOSIS — Z760 Encounter for issue of repeat prescription: Secondary | ICD-10-CM

## 2014-09-28 MED ORDER — TADALAFIL 20 MG PO TABS: 20.0000 mg | ORAL_TABLET | Freq: Every day | ORAL | Status: DC | PRN

## 2014-09-28 NOTE — Telephone Encounter (Signed)
Patient has come in today to see if he can get a script change on tadalafil (CIALIS) 20 MG tablet; Patient has some coupons for 5mg  tablets that he would like to use so that he may receive the medication for free; please f/u with patient about this request; [phone]

## 2014-10-31 ENCOUNTER — Encounter (HOSPITAL_COMMUNITY): Payer: Self-pay | Admitting: Emergency Medicine

## 2014-10-31 ENCOUNTER — Emergency Department (HOSPITAL_COMMUNITY)
Admission: EM | Admit: 2014-10-31 | Discharge: 2014-10-31 | Disposition: A | Discharge status: Home / Self Care | Payer: Medicaid Other | Attending: Emergency Medicine | Admitting: Emergency Medicine

## 2014-10-31 DIAGNOSIS — Z7952 Long term (current) use of systemic steroids: Secondary | ICD-10-CM | POA: Insufficient documentation

## 2014-10-31 DIAGNOSIS — S3992XA Unspecified injury of lower back, initial encounter: Secondary | ICD-10-CM | POA: Diagnosis present

## 2014-10-31 DIAGNOSIS — I509 Heart failure, unspecified: Secondary | ICD-10-CM | POA: Diagnosis not present

## 2014-10-31 DIAGNOSIS — Y9289 Other specified places as the place of occurrence of the external cause: Secondary | ICD-10-CM | POA: Diagnosis not present

## 2014-10-31 DIAGNOSIS — Z21 Asymptomatic human immunodeficiency virus [HIV] infection status: Secondary | ICD-10-CM | POA: Insufficient documentation

## 2014-10-31 DIAGNOSIS — S39012A Strain of muscle, fascia and tendon of lower back, initial encounter: Secondary | ICD-10-CM

## 2014-10-31 DIAGNOSIS — G8929 Other chronic pain: Secondary | ICD-10-CM | POA: Diagnosis not present

## 2014-10-31 DIAGNOSIS — Z95810 Presence of automatic (implantable) cardiac defibrillator: Secondary | ICD-10-CM | POA: Insufficient documentation

## 2014-10-31 DIAGNOSIS — Z79899 Other long term (current) drug therapy: Secondary | ICD-10-CM | POA: Insufficient documentation

## 2014-10-31 DIAGNOSIS — Z9861 Coronary angioplasty status: Secondary | ICD-10-CM | POA: Diagnosis not present

## 2014-10-31 DIAGNOSIS — W1839XA Other fall on same level, initial encounter: Secondary | ICD-10-CM | POA: Insufficient documentation

## 2014-10-31 DIAGNOSIS — Z7902 Long term (current) use of antithrombotics/antiplatelets: Secondary | ICD-10-CM | POA: Insufficient documentation

## 2014-10-31 DIAGNOSIS — G43909 Migraine, unspecified, not intractable, without status migrainosus: Secondary | ICD-10-CM | POA: Diagnosis not present

## 2014-10-31 DIAGNOSIS — E785 Hyperlipidemia, unspecified: Secondary | ICD-10-CM | POA: Diagnosis not present

## 2014-10-31 DIAGNOSIS — Z7982 Long term (current) use of aspirin: Secondary | ICD-10-CM | POA: Insufficient documentation

## 2014-10-31 DIAGNOSIS — I10 Essential (primary) hypertension: Secondary | ICD-10-CM | POA: Diagnosis not present

## 2014-10-31 DIAGNOSIS — Y93G9 Activity, other involving cooking and grilling: Secondary | ICD-10-CM | POA: Diagnosis not present

## 2014-10-31 DIAGNOSIS — I2510 Atherosclerotic heart disease of native coronary artery without angina pectoris: Secondary | ICD-10-CM | POA: Insufficient documentation

## 2014-10-31 DIAGNOSIS — Z8719 Personal history of other diseases of the digestive system: Secondary | ICD-10-CM | POA: Diagnosis not present

## 2014-10-31 DIAGNOSIS — I252 Old myocardial infarction: Secondary | ICD-10-CM | POA: Diagnosis not present

## 2014-10-31 MED ORDER — TRAMADOL HCL 50 MG PO TABS
50.0000 mg | ORAL_TABLET | Freq: Once | ORAL | Status: AC
  Administered 2014-10-31: 50 mg via ORAL
  Filled 2014-10-31: qty 1

## 2014-10-31 MED ORDER — TRAMADOL HCL 50 MG PO TABS: 50.0000 mg | ORAL_TABLET | Freq: Four times a day (QID) | ORAL | Status: DC | PRN

## 2014-10-31 MED ORDER — CYCLOBENZAPRINE HCL 10 MG PO TABS: 10.0000 mg | ORAL_TABLET | Freq: Two times a day (BID) | ORAL | Status: AC | PRN

## 2014-10-31 MED ORDER — CYCLOBENZAPRINE HCL 10 MG PO TABS
10.0000 mg | ORAL_TABLET | Freq: Once | ORAL | Status: AC
  Administered 2014-10-31: 10 mg via ORAL
  Filled 2014-10-31: qty 1

## 2014-10-31 NOTE — ED Notes (Signed)
PA at bedside.

## 2014-10-31 NOTE — ED Notes (Signed)
Patient states last night while cooking dinner his back went out which caused him to fall to the floor. Patient states he fell into the refrigerator. Patient denies any LOC. and rates  Pain  10/10 patient has a  History of choric back pain.

## 2014-10-31 NOTE — ED Provider Notes (Signed)
CSN: [id number]     Arrival date & time 10/31/14  3729 History   First MD Initiated Contact with Patient 10/31/14 0654     Chief Complaint  Patient presents with  . Back Pain     (Consider location/radiation/quality/duration/timing/severity/associated sxs/prior Treatment) HPI Comments: Patient is a 48 year old male with a past medical history of chronic back pain, HIV, hypertension, previous MI who presents with sudden onset of lower back pain that started yesterday after his back gave out when he bent over. Patient reports falling into the refrigerator door. The pain is aching and severe and does not radiate. The pain is constant. Movement makes the pain worse. Nothing makes the pain better. Patient has tried aspirin for pain without relief. No associated symptoms. No saddle paresthesias or bladder/bowel incontinence.     Patient is a 48 y.o. male presenting with back pain.  Back Pain Associated symptoms: no abdominal pain, no chest pain, no dysuria, no fever and no weakness     Past Medical History  Diagnosis Date  . HIV infection dx'd 1993  . Hypertension   . Coronary artery disease   . Hyperlipidemia   . Automatic implantable cardioverter-defibrillator in situ   . CHF (congestive heart failure)   . MI (myocardial infarction) 2011  . GERD (gastroesophageal reflux disease)   . History of stomach ulcers   . Daily headache   . Migraine     "qd" (04/26/2014)  . Chronic lower back pain    Past Surgical History  Procedure Laterality Date  . Cardiac defibrillator placement  04/26/2014  . Coronary angioplasty with stent placement  03/11/2013    Dr Sal[facility] [place] - Promus Premiere Monorail 2.5x28mm to RCA & 2.5x16mm to RCA  . Coronary angioplasty with stent placement  02/11/2010    [facility], [place] - Multi-Link 5.0x61mm to LAD  . Tonsillectomy    . Cystectomy  1990's    "2 taken off my bottom" (04/26/2014)   Family History   Problem Relation Age of Onset  . Stroke Mother   . Hypertension Mother   . Diabetes Mother   . Heart disease Sister   . Cancer Sister    History  Substance Use Topics  . Smoking status: Never Smoker   . Smokeless tobacco: Never Used  . Alcohol Use: No    Review of Systems  Constitutional: Negative for fever, chills and fatigue.  HENT: Negative for trouble swallowing.   Eyes: Negative for visual disturbance.  Respiratory: Negative for shortness of breath.   Cardiovascular: Negative for chest pain and palpitations.  Gastrointestinal: Negative for nausea, vomiting, abdominal pain and diarrhea.  Genitourinary: Negative for dysuria and difficulty urinating.  Musculoskeletal: Positive for back pain. Negative for arthralgias and neck pain.  Skin: Negative for color change.  Neurological: Negative for dizziness and weakness.  Psychiatric/Behavioral: Negative for dysphoric mood.      Allergies  Shellfish allergy  Home Medications   Prior to Admission medications   Medication Sig Start Date End Date Taking? Authorizing Provider  aspirin EC 325 MG tablet Take 325 mg by mouth daily.   Yes Historical Provider, MD  carvedilol (COREG) 12.5 MG tablet Take 1 tablet (12.5 mg total) by mouth 2 (two) times daily with a meal. 11/11/13  Yes [name], MD  clopidogrel (PLAVIX) 75 MG tablet Take 1 tablet (75 mg total) by mouth daily with breakfast. 11/11/13  Yes [name], MD  diphenhydrAMINE (BENADRYL) 25 mg capsule  Take 25 mg by mouth 2 (two) times daily.    Yes Historical Provider, MD  dronabinol (MARINOL) 2.5 MG capsule Take 1 capsule (2.5 mg total) by mouth 2 (two) times daily before a meal. 05/17/14  Yes Deepak Advani, MD  efavirenz-emtricitabine-tenofovir (ATRIPLA) 600-200-300 MG per tablet Take 1 tablet by mouth at bedtime.   Yes Historical Provider, MD  ENSURE PLUS (ENSURE PLUS) LIQD Take 237 mLs by mouth 2 (two) times daily between meals.   Yes Historical Provider, MD   gabapentin (NEURONTIN) 100 MG capsule Take 100 mg by mouth 3 (three) times daily.   Yes Historical Provider, MD  lisinopril (ZESTRIL) 10 MG tablet Take 0.5 tablets (5 mg total) by mouth daily. 11/11/13  Yes Judyann Munsonynthia Snider, MD  Multiple Vitamin (MULTIVITAMIN WITH MINERALS) TABS tablet Take 1 tablet by mouth at bedtime.   Yes Historical Provider, MD  nitroGLYCERIN (NITROSTAT) 0.4 MG SL tablet Place 0.4 mg under the tongue every 5 (five) minutes as needed for chest pain.   Yes Historical Provider, MD  rosuvastatin (CRESTOR) 40 MG tablet Take 1 tablet (40 mg total) by mouth at bedtime. 03/15/14  Yes Quentin Angstlugbemiga E Jegede, MD  tadalafil (CIALIS) 20 MG tablet Take 1 tablet (20 mg total) by mouth daily as needed for erectile dysfunction. 09/28/14  Yes Olugbemiga Annitta NeedsE Jegede, MD  ATRIPLA 600-200-300 MG per tablet TAKE 1 TABLET BY MOUTH EVERY NIGHT AT BEDTIME 08/12/14   Judyann Munsonynthia Snider, MD  HYDROcodone-acetaminophen (NORCO/VICODIN) 5-325 MG per tablet Take 1 tablet by mouth every 6 (six) hours as needed for moderate pain or severe pain. 06/22/14   Marissa Sciacca, PA-C  HYDROcodone-acetaminophen (NORCO/VICODIN) 5-325 MG per tablet Take 1 tablet by mouth every 4 (four) hours as needed for moderate pain or severe pain. 07/01/14   Trixie DredgeEmily West, PA-C  methocarbamol (ROBAXIN) 500 MG tablet TAKE ONE TABLET BY MOUTH DAILY AT BEDTIME AS NEEDED FOR MUSCLE SPASMS 08/08/14   Doris Cheadleeepak Advani, MD  predniSONE (DELTASONE) 20 MG tablet 3 tabs po day one, then 2 tabs daily x 4 days 06/22/14   Marissa Sciacca, PA-C  traMADol (ULTRAM) 50 MG tablet Take 1 tablet (50 mg total) by mouth every 12 (twelve) hours as needed for moderate pain. 07/25/14   Judyann Munsonynthia Snider, MD  Vitamin D, Ergocalciferol, (DRISDOL) 50000 UNITS CAPS capsule Take 1 capsule (50,000 Units total) by mouth every 7 (seven) days. 03/15/14   Doris Cheadleeepak Advani, MD   BP 108/75 mmHg  Pulse 70  Temp(Src) 97.7 F (36.5 C) (Oral)  Resp 18  Ht 6\' 1"  (1.854 m)  Wt 197 lb (89.359 kg)  BMI  26.00 kg/m2  SpO2 98% Physical Exam  Constitutional: He is oriented to person, place, and time. He appears well-developed and well-nourished. No distress.  HENT:  Head: Normocephalic and atraumatic.  Eyes: Conjunctivae and EOM are normal.  Neck: Normal range of motion.  Cardiovascular: Normal rate and regular rhythm.  Exam reveals no gallop and no friction rub.   No murmur heard. Pulmonary/Chest: Effort normal and breath sounds normal. He has no wheezes. He has no rales. He exhibits no tenderness.  Abdominal: Soft. He exhibits no distension. There is no tenderness. There is no rebound.  Musculoskeletal: Normal range of motion.  No midline spine tenderness to palpation. Right paraspinal lumbar tenderness to palpation.   Neurological: He is alert and oriented to person, place, and time. Coordination normal.  Extremity strength and sensation equal and intact bilaterally. Speech is goal-oriented. Moves limbs without ataxia.   Skin: Skin  is warm and dry.  Psychiatric: He has a normal mood and affect. His behavior is normal.  Nursing note and vitals reviewed.   ED Course  Procedures (including critical care time) Labs Review Labs Reviewed - No data to display  Imaging Review No results found.   EKG Interpretation None      MDM   Final diagnoses:  Lumbar strain, initial encounter    8:00 AM Patient likely has a muscle strain from the fall. He does not want to have any imaging done. Vital stable and patient afebrile. Patient will have Flexeril and Tramadol for pain. Patient will have Orthopedic referral for chronic back pain.   [name], [place] 10/31/14 [phone]

## 2014-10-31 NOTE — Discharge Instructions (Signed)
Take Tramadol as needed for pain. Take Flexeril as needed for muscle spasm. You may take these medications together. Follow up with Dr. Dion Saucier for further evaluation of your back pain.

## 2014-11-07 ENCOUNTER — Other Ambulatory Visit: Payer: Self-pay

## 2014-11-07 DIAGNOSIS — Z760 Encounter for issue of repeat prescription: Secondary | ICD-10-CM

## 2014-11-07 MED ORDER — DRONABINOL 2.5 MG PO CAPS: 2.5000 mg | ORAL_CAPSULE | Freq: Two times a day (BID) | ORAL | Status: DC

## 2014-11-14 ENCOUNTER — Encounter: Payer: Self-pay | Admitting: Internal Medicine

## 2014-11-14 ENCOUNTER — Ambulatory Visit (INDEPENDENT_AMBULATORY_CARE_PROVIDER_SITE_OTHER): Payer: No Typology Code available for payment source | Admitting: Internal Medicine

## 2014-11-14 ENCOUNTER — Ambulatory Visit: Payer: Medicaid Other | Attending: Internal Medicine | Admitting: Internal Medicine

## 2014-11-14 ENCOUNTER — Ambulatory Visit (HOSPITAL_BASED_OUTPATIENT_CLINIC_OR_DEPARTMENT_OTHER): Payer: No Typology Code available for payment source

## 2014-11-14 ENCOUNTER — Other Ambulatory Visit (INDEPENDENT_AMBULATORY_CARE_PROVIDER_SITE_OTHER): Payer: No Typology Code available for payment source

## 2014-11-14 VITALS — BP 110/79 | HR 68 | Ht 73.0 in | Wt 199.0 lb

## 2014-11-14 VITALS — BP 126/89 | HR 67 | Temp 98.0°F | Resp 16 | Wt 199.2 lb

## 2014-11-14 DIAGNOSIS — I2510 Atherosclerotic heart disease of native coronary artery without angina pectoris: Secondary | ICD-10-CM | POA: Diagnosis not present

## 2014-11-14 DIAGNOSIS — R0981 Nasal congestion: Secondary | ICD-10-CM

## 2014-11-14 DIAGNOSIS — Z79899 Other long term (current) drug therapy: Secondary | ICD-10-CM | POA: Insufficient documentation

## 2014-11-14 DIAGNOSIS — Z95810 Presence of automatic (implantable) cardiac defibrillator: Secondary | ICD-10-CM

## 2014-11-14 DIAGNOSIS — B20 Human immunodeficiency virus [HIV] disease: Secondary | ICD-10-CM

## 2014-11-14 DIAGNOSIS — Z23 Encounter for immunization: Secondary | ICD-10-CM | POA: Diagnosis not present

## 2014-11-14 DIAGNOSIS — Z9861 Coronary angioplasty status: Secondary | ICD-10-CM | POA: Insufficient documentation

## 2014-11-14 DIAGNOSIS — Z7952 Long term (current) use of systemic steroids: Secondary | ICD-10-CM | POA: Diagnosis not present

## 2014-11-14 DIAGNOSIS — E785 Hyperlipidemia, unspecified: Secondary | ICD-10-CM

## 2014-11-14 DIAGNOSIS — Z7982 Long term (current) use of aspirin: Secondary | ICD-10-CM | POA: Insufficient documentation

## 2014-11-14 DIAGNOSIS — I509 Heart failure, unspecified: Secondary | ICD-10-CM | POA: Insufficient documentation

## 2014-11-14 DIAGNOSIS — Z760 Encounter for issue of repeat prescription: Secondary | ICD-10-CM

## 2014-11-14 DIAGNOSIS — Z7902 Long term (current) use of antithrombotics/antiplatelets: Secondary | ICD-10-CM | POA: Insufficient documentation

## 2014-11-14 DIAGNOSIS — N529 Male erectile dysfunction, unspecified: Secondary | ICD-10-CM | POA: Diagnosis not present

## 2014-11-14 DIAGNOSIS — I429 Cardiomyopathy, unspecified: Secondary | ICD-10-CM

## 2014-11-14 DIAGNOSIS — I10 Essential (primary) hypertension: Secondary | ICD-10-CM

## 2014-11-14 DIAGNOSIS — I428 Other cardiomyopathies: Secondary | ICD-10-CM

## 2014-11-14 DIAGNOSIS — I2583 Coronary atherosclerosis due to lipid rich plaque: Secondary | ICD-10-CM

## 2014-11-14 DIAGNOSIS — I252 Old myocardial infarction: Secondary | ICD-10-CM | POA: Diagnosis not present

## 2014-11-14 DIAGNOSIS — I255 Ischemic cardiomyopathy: Secondary | ICD-10-CM

## 2014-11-14 LAB — CBC WITH DIFFERENTIAL/PLATELET
BASOS ABS: 0 10*3/uL (ref 0.0–0.1)
BASOS PCT: 0 % (ref 0–1)
EOS ABS: 0.1 10*3/uL (ref 0.0–0.7)
Eosinophils Relative: 1 % (ref 0–5)
HEMATOCRIT: 43.8 % (ref 39.0–52.0)
Hemoglobin: 15.3 g/dL (ref 13.0–17.0)
Lymphocytes Relative: 39 % (ref 12–46)
Lymphs Abs: 2.5 10*3/uL (ref 0.7–4.0)
MCH: 30.7 pg (ref 26.0–34.0)
MCHC: 34.9 g/dL (ref 30.0–36.0)
MCV: 87.8 fL (ref 78.0–100.0)
MONO ABS: 0.3 10*3/uL (ref 0.1–1.0)
MONOS PCT: 5 % (ref 3–12)
MPV: 9.7 fL (ref 9.4–12.4)
NEUTROS PCT: 55 % (ref 43–77)
Neutro Abs: 3.5 10*3/uL (ref 1.7–7.7)
PLATELETS: 148 10*3/uL — AB (ref 150–400)
RBC: 4.99 MIL/uL (ref 4.22–5.81)
RDW: 14.6 % (ref 11.5–15.5)
WBC: 6.4 10*3/uL (ref 4.0–10.5)

## 2014-11-14 LAB — COMPLETE METABOLIC PANEL WITH GFR
ALK PHOS: 64 U/L (ref 39–117)
ALT: 22 U/L (ref 0–53)
AST: 21 U/L (ref 0–37)
Albumin: 4.5 g/dL (ref 3.5–5.2)
BUN: 9 mg/dL (ref 6–23)
CO2: 28 mEq/L (ref 19–32)
Calcium: 9.1 mg/dL (ref 8.4–10.5)
Chloride: 100 mEq/L (ref 96–112)
Creat: 0.93 mg/dL (ref 0.50–1.35)
GFR, Est African American: >89 mL/min
GLUCOSE: 88 mg/dL (ref 70–99)
Potassium: 4.6 mEq/L (ref 3.5–5.3)
SODIUM: 139 meq/L (ref 135–145)
Total Bilirubin: 0.3 mg/dL (ref 0.2–1.2)
Total Protein: 7.1 g/dL (ref 6.0–8.3)

## 2014-11-14 MED ORDER — DRONABINOL 2.5 MG PO CAPS: 2.5000 mg | ORAL_CAPSULE | Freq: Two times a day (BID) | ORAL | Status: DC

## 2014-11-14 MED ORDER — FLUTICASONE PROPIONATE 50 MCG/ACT NA SUSP: 2.0000 | Freq: Every day | NASAL | Status: DC

## 2014-11-14 NOTE — Patient Instructions (Addendum)
Your physician wants you to follow-up in: 6 months. You will receive a reminder letter in the mail two months in advance. If you don't receive a letter, please call our office to schedule the follow-up appointment.  You have been referred to Cardiac Rehab @ [facility]

## 2014-11-14 NOTE — Progress Notes (Signed)
OFFICE NOTE  Chief Complaint:  No complaints  Primary Care Physician: [name], MD  HPI:  Edward Haley is a pleasant 48 year old male with an extensive past medical history.  Unfortunately he has a history of HIV disease but has a undetectable viral load. He's been on almost every antiviral medication over the years since his diagnosis which I believe was in the 1980s or 1990s.  This is a history of incarceration for 17 years.  He subsequently developed coronary artery disease and does have a strong family history of this. In 2011 he had his first MI which was a major anterior MI.  He had placement of a vision 5.0 x 18 MM MultiLink bare metal stent to the LAD and February 2011 at [facility] in [place].  Subsequently, he developed recurrent coronary disease and had 2 stents placed in the right coronary artery in March of 2014 in [place], [place].  These were Promus Premier drug-eluting stents (2.5 x 16 mm and 2.5 x 20 mm), both to the RCA.  He has mentioned that on nuclear stress testing he was told to have an EF of 15%, with extensive scar, however I do not have his cardiac records available today for evaluation. He is most recent cardiologist was at [facility] in [place].   Woodrick returns today for follow-up. He is undergone implantation of an AICD in the interim and followed up with my partner Dr. Salena Saner. He denies any chest pain or worsening shortness of breath. He occasionally had some tingling sensations around the device but those have resolved. He has received his SSDI and is interested in cardiac rehabilitation.  PMHx:  Past Medical History  Diagnosis Date  . HIV infection dx'd 1993  . Hypertension   . Coronary artery disease   . Hyperlipidemia   . Automatic implantable cardioverter-defibrillator in situ   . CHF (congestive heart failure)   . MI (myocardial infarction) 2011  . GERD (gastroesophageal reflux disease)   . History of stomach  ulcers   . Daily headache   . Migraine     "qd" (04/26/2014)  . Chronic lower back pain     Past Surgical History  Procedure Laterality Date  . Cardiac defibrillator placement  04/26/2014  . Coronary angioplasty with stent placement  03/11/2013    Dr Salomon Fick [facility] [place]) - Promus Premiere Monorail 2.5x40mm to RCA & 2.5x21mm to RCA  . Coronary angioplasty with stent placement  02/11/2010    [facility], [place] - Multi-Link 5.0x44mm to LAD  . Tonsillectomy    . Cystectomy  1990's    "2 taken off my bottom" (04/26/2014)    FAMHx:  Family History  Problem Relation Age of Onset  . Stroke Mother   . Hypertension Mother   . Diabetes Mother   . Heart disease Sister   . Cancer Sister     SOCHx:   reports that he has never smoked. He has never used smokeless tobacco. He reports that he uses illicit drugs (Marijuana) about 4 times per week. He reports that he does not drink alcohol.  ALLERGIES:  Allergies  Allergen Reactions  . Shellfish Allergy Itching and Swelling    ROS: A comprehensive review of systems was negative.  HOME MEDS: Current Outpatient Prescriptions  Medication Sig Dispense Refill  . aspirin EC 325 MG tablet Take 325 mg by mouth daily.    . ATRIPLA 600-200-300 MG per tablet TAKE 1 TABLET  BY MOUTH EVERY NIGHT AT BEDTIME 30 tablet 4  . carvedilol (COREG) 12.5 MG tablet Take 1 tablet (12.5 mg total) by mouth 2 (two) times daily with a meal. 60 tablet 11  . clopidogrel (PLAVIX) 75 MG tablet Take 1 tablet (75 mg total) by mouth daily with breakfast. 30 tablet 11  . cyclobenzaprine (FLEXERIL) 10 MG tablet Take 1 tablet (10 mg total) by mouth 2 (two) times daily as needed for muscle spasms. 20 tablet 0  . diphenhydrAMINE (BENADRYL) 25 mg capsule Take 25 mg by mouth 2 (two) times daily.     Marland Kitchen dronabinol (MARINOL) 2.5 MG capsule Take 1 capsule (2.5 mg total) by mouth 2 (two) times daily before a meal. 60 capsule 3  .  efavirenz-emtricitabine-tenofovir (ATRIPLA) 600-200-300 MG per tablet Take 1 tablet by mouth at bedtime.    . ENSURE PLUS (ENSURE PLUS) LIQD Take 237 mLs by mouth 2 (two) times daily between meals.    . fluticasone (FLONASE) 50 MCG/ACT nasal spray Place 2 sprays into both nostrils daily. 16 g 2  . gabapentin (NEURONTIN) 100 MG capsule Take 100 mg by mouth 3 (three) times daily.    Marland Kitchen lisinopril (ZESTRIL) 10 MG tablet Take 0.5 tablets (5 mg total) by mouth daily. 30 tablet 6  . methocarbamol (ROBAXIN) 500 MG tablet TAKE ONE TABLET BY MOUTH DAILY AT BEDTIME AS NEEDED FOR MUSCLE SPASMS 30 tablet 1  . Multiple Vitamin (MULTIVITAMIN WITH MINERALS) TABS tablet Take 1 tablet by mouth at bedtime.    . nitroGLYCERIN (NITROSTAT) 0.4 MG SL tablet Place 0.4 mg under the tongue every 5 (five) minutes as needed for chest pain.    . promethazine (PHENERGAN) 25 MG tablet as needed.  9  . rosuvastatin (CRESTOR) 40 MG tablet Take 1 tablet (40 mg total) by mouth at bedtime. 90 tablet 3  . tadalafil (CIALIS) 20 MG tablet Take 1 tablet (20 mg total) by mouth daily as needed for erectile dysfunction. 3 tablet 0  . traMADol (ULTRAM) 50 MG tablet Take 1 tablet (50 mg total) by mouth every 6 (six) hours as needed. 15 tablet 0  . Vitamin D, Ergocalciferol, (DRISDOL) 50000 UNITS CAPS capsule Take 1 capsule (50,000 Units total) by mouth every 7 (seven) days. 12 capsule 0   No current facility-administered medications for this visit.    LABS/IMAGING: No results found for this or any previous visit (from the past 48 hour(s)). No results found.  VITALS: BP 110/79 mmHg  Pulse 68  Ht 6\' 1"  (1.854 m)  Wt 199 lb (90.266 kg)  BMI 26.26 kg/m2  EXAM: General appearance: alert and no distress Neck: no carotid bruit and no JVD Lungs: clear to auscultation bilaterally Heart: regular rate and rhythm, S1, S2 normal, no murmur, click, rub or gallop Abdomen: soft, non-tender; bowel sounds normal; no masses,  no  organomegaly Extremities: extremities normal, atraumatic, no cyanosis or edema Pulses: 2+ and symmetric Skin: Skin color, texture, turgor normal. No rashes or lesions Neurologic: Grossly normal Psych: Mood, affect normal  EKG: Normal sinus rhythm at 68, left axis deviation, inferolateral T-wave inversions and repolarization changes anteriorly  ASSESSMENT: 1. 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 Ischemic cardiomyopathy, EF 15% by patient report 3. PCI to the proximal LAD with a 5.0 x 18 mm Vision Multilink BMS in 2011 4. PCI x 2 to the mid-RCA with 2.5 x 16 mm and 2.5 x 20 mm Promus Premier DESs 5. Dyslipidemia 6. Peripheral neuropathy  7. Long-standing HIV disease-with undetectable viral load 8. Status post Medtronic AICD for primary prevention of sudden cardiac death  PLAN: 1.   Mr. Roxan HockeyRobinson unfortunately has had little to no improvement in his EF. He is a good candidate for AICD and successfully underwent AICD placement. Symptomatically he is doing well and appears to be euvolemic despite the fact he is not on diuretics. Review of his heart failure medications are appropriate. He is requesting cardiac rehabilitation today which is reasonable for maintenance especially since he now has insurance. Plan to see him back in 6 months. He will follow-up with Dr. Salena Saner annually and continue to do remote checks every few months.  Chrystie NoseKenneth C. Baylee Campus, MD, [facility]villeFACC Attending Cardiologist [facility]  Isma Tietje C 11/14/2014, 3:20 PM

## 2014-11-14 NOTE — Progress Notes (Signed)
MRN: [id number] Name: Edward Haley  Sex: male Age: 48 y.o. DOB: 03-03-1966  Allergies: Shellfish allergy  Chief Complaint  Patient presents with  . Follow-up    HPI: Patient is 48 y.o. male who history of CAD, ischemic cardiomyopathy status post ICD in place, HIV, hypertension, hyperlipidemia comes today requesting refill on his medication, he takes Marinol which helps him with the appetite , he denies any acute symptoms denies any headache dizziness chest and shortness of breath, as per patient he already has scheduled followup  with his cardiologist blood pressure is well controlled, patient would like to have a flu shot today. She does complain of some nasal congestion denies any fever chills any sore throat.  Past Medical History  Diagnosis Date  . HIV infection dx'd 1993  . Hypertension   . Coronary artery disease   . Hyperlipidemia   . Automatic implantable cardioverter-defibrillator in situ   . CHF (congestive heart failure)   . MI (myocardial infarction) 2011  . GERD (gastroesophageal reflux disease)   . History of stomach ulcers   . Daily headache   . Migraine     "qd" (04/26/2014)  . Chronic lower back pain     Past Surgical History  Procedure Laterality Date  . Cardiac defibrillator placement  04/26/2014  . Coronary angioplasty with stent placement  03/11/2013    Dr Salomon Fick [facility] [place] - Promus Premiere Monorail 2.5x40mm to RCA & 2.5x47mm to RCA  . Coronary angioplasty with stent placement  02/11/2010    [facility], [place] - Multi-Link 5.0x37mm to LAD  . Tonsillectomy    . Cystectomy  1990's    "2 taken off my bottom" (04/26/2014)      Medication List       This list is accurate as of: 11/14/14 11:41 AM.  Always use your most recent med list.               aspirin EC 325 MG tablet  Take 325 mg by mouth daily.     efavirenz-emtricitabine-tenofovir 600-200-300 MG per tablet  Commonly known as:  ATRIPLA   Take 1 tablet by mouth at bedtime.     ATRIPLA 600-200-300 MG per tablet  Generic drug:  efavirenz-emtricitabine-tenofovir  TAKE 1 TABLET BY MOUTH EVERY NIGHT AT BEDTIME     carvedilol 12.5 MG tablet  Commonly known as:  COREG  Take 1 tablet (12.5 mg total) by mouth 2 (two) times daily with a meal.     clopidogrel 75 MG tablet  Commonly known as:  PLAVIX  Take 1 tablet (75 mg total) by mouth daily with breakfast.     cyclobenzaprine 10 MG tablet  Commonly known as:  FLEXERIL  Take 1 tablet (10 mg total) by mouth 2 (two) times daily as needed for muscle spasms.     diphenhydrAMINE 25 mg capsule  Commonly known as:  BENADRYL  Take 25 mg by mouth 2 (two) times daily.     dronabinol 2.5 MG capsule  Commonly known as:  MARINOL  Take 1 capsule (2.5 mg total) by mouth 2 (two) times daily before a meal.     ENSURE PLUS Liqd  Take 237 mLs by mouth 2 (two) times daily between meals.     fluticasone 50 MCG/ACT nasal spray  Commonly known as:  FLONASE  Place 2 sprays into both nostrils daily.     gabapentin 100 MG capsule  Commonly known as:  NEURONTIN  Take  100 mg by mouth 3 (three) times daily.     HYDROcodone-acetaminophen 5-325 MG per tablet  Commonly known as:  NORCO/VICODIN  Take 1 tablet by mouth every 6 (six) hours as needed for moderate pain or severe pain.     HYDROcodone-acetaminophen 5-325 MG per tablet  Commonly known as:  NORCO/VICODIN  Take 1 tablet by mouth every 4 (four) hours as needed for moderate pain or severe pain.     lisinopril 10 MG tablet  Commonly known as:  ZESTRIL  Take 0.5 tablets (5 mg total) by mouth daily.     methocarbamol 500 MG tablet  Commonly known as:  ROBAXIN  TAKE ONE TABLET BY MOUTH DAILY AT BEDTIME AS NEEDED FOR MUSCLE SPASMS     multivitamin with minerals Tabs tablet  Take 1 tablet by mouth at bedtime.     nitroGLYCERIN 0.4 MG SL tablet  Commonly known as:  NITROSTAT  Place 0.4 mg under the tongue every 5 (five) minutes as  needed for chest pain.     predniSONE 20 MG tablet  Commonly known as:  DELTASONE  3 tabs po day one, then 2 tabs daily x 4 days     rosuvastatin 40 MG tablet  Commonly known as:  CRESTOR  Take 1 tablet (40 mg total) by mouth at bedtime.     tadalafil 20 MG tablet  Commonly known as:  CIALIS  Take 1 tablet (20 mg total) by mouth daily as needed for erectile dysfunction.     traMADol 50 MG tablet  Commonly known as:  ULTRAM  Take 1 tablet (50 mg total) by mouth every 12 (twelve) hours as needed for moderate pain.     traMADol 50 MG tablet  Commonly known as:  ULTRAM  Take 1 tablet (50 mg total) by mouth every 6 (six) hours as needed.     Vitamin D (Ergocalciferol) 50000 UNITS Caps capsule  Commonly known as:  DRISDOL  Take 1 capsule (50,000 Units total) by mouth every 7 (seven) days.        Meds ordered this encounter  Medications  . dronabinol (MARINOL) 2.5 MG capsule    Sig: Take 1 capsule (2.5 mg total) by mouth 2 (two) times daily before a meal.    Dispense:  60 capsule    Refill:  3    Pass program  . fluticasone (FLONASE) 50 MCG/ACT nasal spray    Sig: Place 2 sprays into both nostrils daily.    Dispense:  16 g    Refill:  2    Immunization History  Administered Date(s) Administered  . Hepatitis A 10/30/2005  . Influenza,inj,Quad PF,36+ Mos 11/11/2013, 11/14/2014  . Pneumococcal Conjugate-13 09/08/2012  . Pneumococcal Polysaccharide-23 01/10/2009  . Td 08/15/2003  . Tdap 01/10/2009    Family History  Problem Relation Age of Onset  . Stroke Mother   . Hypertension Mother   . Diabetes Mother   . Heart disease Sister   . Cancer Sister     History  Substance Use Topics  . Smoking status: Never Smoker   . Smokeless tobacco: Never Used  . Alcohol Use: No    Review of Systems   As noted in HPI  Filed Vitals:   11/14/14 1055  BP: 126/89  Pulse: 67  Temp: 98 F (36.7 C)  Resp: 16    Physical Exam  Physical Exam  Constitutional: No  distress.  HENT:  Nasal congestion no sinus tenderness   Eyes: EOM are normal. Pupils  are equal, round, and reactive to light.  Cardiovascular: Normal rate and regular rhythm.   Pulmonary/Chest: Breath sounds normal. No respiratory distress. He has no wheezes. He has no rales.  palpable ICD left side chest wall     CBC    Component Value Date/Time   WBC 4.0 07/25/2014 0920   RBC 4.80 07/25/2014 0920   HGB 14.7 07/25/2014 0920   HCT 42.0 07/25/2014 0920   PLT 154 07/25/2014 0920   MCV 87.5 07/25/2014 0920   LYMPHSABS 2.1 07/25/2014 0920   MONOABS 0.4 07/25/2014 0920   EOSABS 0.1 07/25/2014 0920   BASOSABS 0.0 07/25/2014 0920    CMP     Component Value Date/Time   NA 140 07/25/2014 0920   K 4.5 07/25/2014 0920   CL 104 07/25/2014 0920   CO2 31 07/25/2014 0920   GLUCOSE 82 07/25/2014 0920   BUN 10 07/25/2014 0920   CREATININE 1.08 07/25/2014 0920   CALCIUM 9.5 07/25/2014 0920   PROT 7.0 07/25/2014 0920   ALBUMIN 4.6 07/25/2014 0920   AST 20 07/25/2014 0920   ALT 15 07/25/2014 0920   ALKPHOS 57 07/25/2014 0920   BILITOT 0.3 07/25/2014 0920   GFRNONAA 80 07/25/2014 0920   GFRAA >89 07/25/2014 0920    Lab Results  Component Value Date/Time   CHOL 147 07/25/2014 09:20 AM    No components found for: HGA1C  Lab Results  Component Value Date/Time   AST 20 07/25/2014 09:20 AM    Assessment and Plan  Essential hypertension Blood pressure is well controlled continue with meds.  HIV disease - Plan: dronabinol (MARINOL) 2.5 MG capsule, patient follows up with infectious disease.   Needs flu shot Flu shot given today.  Nasal congestion - Plan: fluticasone (FLONASE) 50 MCG/ACT nasal spray   Health Maintenance  Flu shot today.  Return in about 4 months (around 03/15/2015).  Doris CheadleADVANI, Lang Zingg, MD

## 2014-11-14 NOTE — Progress Notes (Signed)
Patient here for follow up on his chronic pain and medication refill Has a follow up appt. With cardiologist this afternoon

## 2014-11-15 ENCOUNTER — Other Ambulatory Visit: Payer: No Typology Code available for payment source

## 2014-11-15 LAB — HIV-1 RNA QUANT-NO REFLEX-BLD: HIV-1 RNA Quant, Log: <1.3 {Log} (ref <1.30)

## 2014-11-15 LAB — T-HELPER CELL (CD4) - (RCID CLINIC ONLY)
CD4 % Helper T Cell: 41 % (ref 33–55)
CD4 T CELL ABS: 970 /uL (ref 400–2700)

## 2014-11-16 ENCOUNTER — Telehealth: Payer: Self-pay | Admitting: *Deleted

## 2014-11-16 NOTE — Telephone Encounter (Signed)
Faxed order for maintenance cardiac rehab program referral - MI, PTCA, stent, CHF, cardiomyopathy, AICD, low functional capacity

## 2014-11-29 ENCOUNTER — Other Ambulatory Visit: Payer: Self-pay | Admitting: Internal Medicine

## 2014-12-01 ENCOUNTER — Other Ambulatory Visit: Payer: Self-pay

## 2014-12-08 ENCOUNTER — Telehealth: Payer: Self-pay | Admitting: *Deleted

## 2014-12-08 ENCOUNTER — Encounter (HOSPITAL_COMMUNITY): Payer: Self-pay | Admitting: Cardiovascular Disease

## 2014-12-08 NOTE — Telephone Encounter (Signed)
Patient called about refills on his medications. Advised the patient Dr Drue Second will fill his HIV medications but that he needs to call his Cardiologist and PCP for the other medications. He advised he had done this and they will not fill his heart medications or BP meds. Advised him to explain that Dr Drue Second filled when he came to [facility] until he got established and now that he is the appropriate doctors need to do so. The patient became irrate, stated that we are all giving him the run around and disconnected the call. I will ask Dr Drue Second if she will continue to fill the other meds.

## 2014-12-09 ENCOUNTER — Other Ambulatory Visit: Payer: Self-pay | Admitting: *Deleted

## 2014-12-09 ENCOUNTER — Telehealth: Payer: Self-pay | Admitting: Internal Medicine

## 2014-12-09 DIAGNOSIS — B20 Human immunodeficiency virus [HIV] disease: Secondary | ICD-10-CM

## 2014-12-09 DIAGNOSIS — I259 Chronic ischemic heart disease, unspecified: Secondary | ICD-10-CM

## 2014-12-09 MED ORDER — LISINOPRIL 10 MG PO TABS: 5.0000 mg | ORAL_TABLET | Freq: Every day | ORAL | Status: DC

## 2014-12-09 MED ORDER — PROMETHAZINE HCL 50 MG PO TABS: 50.0000 mg | ORAL_TABLET | Freq: Three times a day (TID) | ORAL | Status: DC | PRN

## 2014-12-09 MED ORDER — CLOPIDOGREL BISULFATE 75 MG PO TABS: 75.0000 mg | ORAL_TABLET | Freq: Every day | ORAL | Status: DC

## 2014-12-09 MED ORDER — EFAVIRENZ-EMTRICITAB-TENOFOVIR 600-200-300 MG PO TABS: 1.0000 | ORAL_TABLET | Freq: Every day | ORAL | Status: DC

## 2014-12-09 MED ORDER — CARVEDILOL 12.5 MG PO TABS: 12.5000 mg | ORAL_TABLET | Freq: Two times a day (BID) | ORAL | Status: DC

## 2014-12-09 NOTE — Telephone Encounter (Signed)
Patient called [facility] very upset using profanity and not letting me explain that his medications have been refilled and that I will send the HIV medication in and that some of his medications are not going to be refilled because they do not need to be. I was finally able to tell him to check with his pharmacy as they should be there. He hung up on me again after cursing a few times.

## 2014-12-09 NOTE — Telephone Encounter (Signed)
Refilled Carvedilol, clopidogrel, lisinopril. Called pt to inform, he did not give me time to explain that I refilled these medications before stating frustration that he had been "pushed around" by other offices and had now gone without his medications since last week. Pt was verbally abusive as I I did my best to explain that I was refilling prescriptions that our provider had written. The patient told me that if he could not get the other meds he would not bother taking these, and hung up the phone.

## 2014-12-09 NOTE — Telephone Encounter (Signed)
Pt called in stating that he has been having problems getting his medications refilled and he has been without these for 5 days and has been feeling his health decline. He needs : Carvedilol Clopidogrel Lisinopril Promethazine Tramadol Vitamin D  The pharmacy he uses is [facility] at [address] [phone] Please call

## 2014-12-12 ENCOUNTER — Ambulatory Visit (INDEPENDENT_AMBULATORY_CARE_PROVIDER_SITE_OTHER): Payer: Medicaid Other | Admitting: *Deleted

## 2014-12-12 ENCOUNTER — Other Ambulatory Visit: Payer: Self-pay | Admitting: Cardiovascular Disease

## 2014-12-12 DIAGNOSIS — I428 Other cardiomyopathies: Secondary | ICD-10-CM

## 2014-12-12 DIAGNOSIS — I429 Cardiomyopathy, unspecified: Secondary | ICD-10-CM

## 2014-12-12 LAB — MDC_IDC_ENUM_SESS_TYPE_REMOTE
Battery Remaining Longevity: 134 mo
Battery Voltage: 3.04 V
Date Time Interrogation Session: 20151214083623
HighPow Impedance: 56 Ohm
Lead Channel Pacing Threshold Amplitude: 0.625 V
Lead Channel Pacing Threshold Pulse Width: 0.4 ms
Lead Channel Setting Sensing Sensitivity: 0.3 mV
MDC IDC MSMT LEADCHNL RV IMPEDANCE VALUE: 361 Ohm
MDC IDC MSMT LEADCHNL RV IMPEDANCE VALUE: 456 Ohm
MDC IDC MSMT LEADCHNL RV SENSING INTR AMPL: 25.875 mV
MDC IDC MSMT LEADCHNL RV SENSING INTR AMPL: 25.875 mV
MDC IDC SET LEADCHNL RV PACING AMPLITUDE: 2.5 V
MDC IDC SET LEADCHNL RV PACING PULSEWIDTH: 0.4 ms
MDC IDC SET ZONE DETECTION INTERVAL: 300 ms
MDC IDC STAT BRADY RV PERCENT PACED: 0.01 %
Zone Setting Detection Interval: 360 ms
Zone Setting Detection Interval: 450 ms

## 2014-12-12 NOTE — Progress Notes (Signed)
Remote ICD transmission.   

## 2014-12-13 ENCOUNTER — Encounter: Payer: Self-pay | Admitting: Cardiology

## 2014-12-13 ENCOUNTER — Ambulatory Visit: Payer: No Typology Code available for payment source | Admitting: Internal Medicine

## 2014-12-15 ENCOUNTER — Encounter: Payer: Self-pay | Admitting: Cardiovascular Disease

## 2015-01-19 ENCOUNTER — Emergency Department (HOSPITAL_COMMUNITY)
Admission: EM | Admit: 2015-01-19 | Discharge: 2015-01-19 | Disposition: A | Discharge status: Home / Self Care | Payer: Medicaid Other | Attending: Emergency Medicine | Admitting: Emergency Medicine

## 2015-01-19 ENCOUNTER — Encounter (HOSPITAL_COMMUNITY): Payer: Self-pay | Admitting: Neurology

## 2015-01-19 DIAGNOSIS — I2510 Atherosclerotic heart disease of native coronary artery without angina pectoris: Secondary | ICD-10-CM | POA: Diagnosis not present

## 2015-01-19 DIAGNOSIS — Z9861 Coronary angioplasty status: Secondary | ICD-10-CM | POA: Diagnosis not present

## 2015-01-19 DIAGNOSIS — M549 Dorsalgia, unspecified: Secondary | ICD-10-CM | POA: Diagnosis present

## 2015-01-19 DIAGNOSIS — Z21 Asymptomatic human immunodeficiency virus [HIV] infection status: Secondary | ICD-10-CM | POA: Diagnosis not present

## 2015-01-19 DIAGNOSIS — Z7902 Long term (current) use of antithrombotics/antiplatelets: Secondary | ICD-10-CM | POA: Insufficient documentation

## 2015-01-19 DIAGNOSIS — Z95810 Presence of automatic (implantable) cardiac defibrillator: Secondary | ICD-10-CM | POA: Insufficient documentation

## 2015-01-19 DIAGNOSIS — M5441 Lumbago with sciatica, right side: Secondary | ICD-10-CM

## 2015-01-19 DIAGNOSIS — Z7982 Long term (current) use of aspirin: Secondary | ICD-10-CM | POA: Diagnosis not present

## 2015-01-19 DIAGNOSIS — Z8719 Personal history of other diseases of the digestive system: Secondary | ICD-10-CM | POA: Insufficient documentation

## 2015-01-19 DIAGNOSIS — E785 Hyperlipidemia, unspecified: Secondary | ICD-10-CM | POA: Diagnosis not present

## 2015-01-19 DIAGNOSIS — I10 Essential (primary) hypertension: Secondary | ICD-10-CM | POA: Diagnosis not present

## 2015-01-19 DIAGNOSIS — I509 Heart failure, unspecified: Secondary | ICD-10-CM | POA: Diagnosis not present

## 2015-01-19 DIAGNOSIS — Z79899 Other long term (current) drug therapy: Secondary | ICD-10-CM | POA: Insufficient documentation

## 2015-01-19 DIAGNOSIS — G43909 Migraine, unspecified, not intractable, without status migrainosus: Secondary | ICD-10-CM | POA: Diagnosis not present

## 2015-01-19 DIAGNOSIS — Z7951 Long term (current) use of inhaled steroids: Secondary | ICD-10-CM | POA: Insufficient documentation

## 2015-01-19 DIAGNOSIS — G8929 Other chronic pain: Secondary | ICD-10-CM | POA: Diagnosis not present

## 2015-01-19 DIAGNOSIS — I252 Old myocardial infarction: Secondary | ICD-10-CM | POA: Insufficient documentation

## 2015-01-19 MED ORDER — OXYCODONE-ACETAMINOPHEN 5-325 MG PO TABS
1.0000 | ORAL_TABLET | Freq: Once | ORAL | Status: AC
  Administered 2015-01-19: 1 via ORAL
  Filled 2015-01-19: qty 1

## 2015-01-19 MED ORDER — PREDNISONE 20 MG PO TABS
60.0000 mg | ORAL_TABLET | Freq: Once | ORAL | Status: AC
  Administered 2015-01-19: 60 mg via ORAL
  Filled 2015-01-19: qty 3

## 2015-01-19 MED ORDER — PREDNISONE 20 MG PO TABS: 40.0000 mg | ORAL_TABLET | Freq: Every day | ORAL | Status: AC

## 2015-01-19 MED ORDER — HYDROCODONE-ACETAMINOPHEN 5-325 MG PO TABS: 1.0000 | ORAL_TABLET | ORAL | Status: DC | PRN

## 2015-01-19 NOTE — ED Notes (Signed)
Declined W/C at D/C and was escorted to lobby by RN. 

## 2015-01-19 NOTE — ED Notes (Signed)
Reports chronic back pain, just received his Medicaid today. Is here for pain meds and referral to back doctor. Pt is able to ambulate.

## 2015-01-19 NOTE — ED Provider Notes (Signed)
CSN: [id number]     Arrival date & time 01/19/15  1155 History  This chart was scribed for non-physician practitioner, [name], NP-C, working with Lyanne Co, MD by Charline Bills, ED Scribe. This patient was seen in room TR11C/TR11C and the patient's care was started at 12:54 PM.   Chief Complaint  Patient presents with  . Back Pain   The history is provided by the patient. No language interpreter was used.   HPI Comments: Edward Haley is a 49 y.o. male, with a h/o HIV, HTN, CAD, hyperlipidemia, CHF, MI, neuropathy, GERD, who presents to the Emergency Department complaining of acute exacerbation of chronic back pain. Pt states that he has experienced back pain for 26 years. Pt reports that back pain radiates down R leg with walking. No injury. He denies fever, urinary or bowel incontinence. No h/o CA, DM or IV drug use.   Past Medical History  Diagnosis Date  . HIV infection dx'd 1993  . Hypertension   . Coronary artery disease   . Hyperlipidemia   . Automatic implantable cardioverter-defibrillator in situ   . CHF (congestive heart failure)   . MI (myocardial infarction) 2011  . GERD (gastroesophageal reflux disease)   . History of stomach ulcers   . Daily headache   . Migraine     "qd" (04/26/2014)  . Chronic lower back pain    Past Surgical History  Procedure Laterality Date  . Cardiac defibrillator placement  04/26/2014  . Coronary angioplasty with stent placement  03/11/2013    Dr Salomon Fick [facility] - [place]) - Promus Premiere Monorail 2.5x35mm to RCA & 2.5x16mm to RCA  . Coronary angioplasty with stent placement  02/11/2010    [facility], [place] - Multi-Link 5.0x32mm to LAD  . Tonsillectomy    . Cystectomy  1990's    "2 taken off my bottom" (04/26/2014)  . Implantable cardioverter defibrillator implant N/A 04/26/2014    Procedure: IMPLANTABLE CARDIOVERTER DEFIBRILLATOR IMPLANT;  Surgeon: Thurmon Fair, MD;  Location: [facility]  CATH LAB;  Service: Cardiovascular;  Laterality: N/A;   Family History  Problem Relation Age of Onset  . Stroke Mother   . Hypertension Mother   . Diabetes Mother   . Heart disease Sister   . Cancer Sister    History  Substance Use Topics  . Smoking status: Never Smoker   . Smokeless tobacco: Never Used  . Alcohol Use: No    Review of Systems  Constitutional: Negative for fever.  Musculoskeletal: Positive for back pain.  All other systems reviewed and are negative.  Allergies  Shellfish allergy  Home Medications   Prior to Admission medications   Medication Sig Start Date End Date Taking? Authorizing Provider  aspirin EC 325 MG tablet Take 325 mg by mouth daily.    Historical Provider, MD  carvedilol (COREG) 12.5 MG tablet Take 1 tablet (12.5 mg total) by mouth 2 (two) times daily with a meal. 12/09/14   Chrystie Nose, MD  clopidogrel (PLAVIX) 75 MG tablet Take 1 tablet (75 mg total) by mouth daily with breakfast. 12/09/14   Chrystie Nose, MD  cyclobenzaprine (FLEXERIL) 10 MG tablet Take 1 tablet (10 mg total) by mouth 2 (two) times daily as needed for muscle spasms. 10/31/14   Kaitlyn Szekalski, PA-C  diphenhydrAMINE (BENADRYL) 25 mg capsule Take 25 mg by mouth 2 (two) times daily.     Historical Provider, MD  dronabinol (MARINOL) 2.5 MG capsule Take 1 capsule (  2.5 mg total) by mouth 2 (two) times daily before a meal. 11/14/14   [name], MD  efavirenz-emtricitabine-tenofovir (ATRIPLA) 600-200-300 MG per tablet Take 1 tablet by mouth at bedtime. 12/09/14   [name], MD  ENSURE PLUS (ENSURE PLUS) LIQD Take 237 mLs by mouth 2 (two) times daily between meals.    Historical Provider, MD  fluticasone (FLONASE) 50 MCG/ACT nasal spray Place 2 sprays into both nostrils daily. 11/14/14   [name], MD  gabapentin (NEURONTIN) 100 MG capsule Take 100 mg by mouth 3 (three) times daily.    Historical Provider, MD  lisinopril (ZESTRIL) 10 MG tablet Take 0.5 tablets (5 mg  total) by mouth daily. 12/09/14   Chrystie Nose, MD  methocarbamol (ROBAXIN) 500 MG tablet TAKE ONE TABLET BY MOUTH DAILY AT BEDTIME AS NEEDED FOR MUSCLE SPASMS 08/08/14   [name], MD  Multiple Vitamin (MULTIVITAMIN WITH MINERALS) TABS tablet Take 1 tablet by mouth at bedtime.    Historical Provider, MD  nitroGLYCERIN (NITROSTAT) 0.4 MG SL tablet Place 0.4 mg under the tongue every 5 (five) minutes as needed for chest pain.    Historical Provider, MD  promethazine (PHENERGAN) 50 MG tablet Take 1 tablet (50 mg total) by mouth every 8 (eight) hours as needed for nausea or vomiting. 12/09/14   [name], MD  rosuvastatin (CRESTOR) 40 MG tablet Take 1 tablet (40 mg total) by mouth at bedtime. 03/15/14   [name], MD  tadalafil (CIALIS) 20 MG tablet Take 1 tablet (20 mg total) by mouth daily as needed for erectile dysfunction. 09/28/14   [name], MD  traMADol (ULTRAM) 50 MG tablet Take 1 tablet (50 mg total) by mouth every 6 (six) hours as needed. 10/31/14   [name], PA-C  Vitamin D, Ergocalciferol, (DRISDOL) 50000 UNITS CAPS capsule Take 1 capsule (50,000 Units total) by mouth every 7 (seven) days. 03/15/14   [name], MD   Triage Vitals: BP 116/85 mmHg  Pulse 76  Temp(Src) 97.5 F (36.4 C) (Oral)  Resp 20  SpO2 96% Physical Exam  Constitutional: He is oriented to person, place, and time. He appears well-developed and well-nourished. No distress.  HENT:  Head: Normocephalic and atraumatic.  Eyes: Conjunctivae and EOM are normal.  Neck: Neck supple.  Cardiovascular: Normal rate.   Pulmonary/Chest: Effort normal.  Musculoskeletal: Normal range of motion.  Tenderness to palpation in R lumbar paraspinous muscles, hip and SI joint with radiation down R thigh.  Neurological: He is alert and oriented to person, place, and time. No cranial nerve deficit. Coordination normal.  Skin: Skin is warm and dry.  Psychiatric: He has a normal mood and affect. His  behavior is normal.  Nursing note and vitals reviewed.  ED Course  Procedures (including critical care time) DIAGNOSTIC STUDIES: Oxygen Saturation is 96% on RA, normal by my interpretation.    COORDINATION OF CARE: 12:58 PM-Discussed treatment plan which includes Percocet, Prednisone and follow-up with ortho with pt at bedside and pt agreed to plan.   Labs Review Labs Reviewed - No data to display  Imaging Review No results found.   EKG Interpretation None      MDM   Final diagnoses:  Right-sided low back pain with right-sided sciatica   49 yo with persistent  back pain, recently received medicaid and would like resources for back mgmt. His neuro exam is normal but he does have right lumbar pain with radicular symptoms down right leg, but normal strength and sensation.  There is no loss of bowel or bladder control, no concern for cauda equina, no fever, night sweats, weight loss, h/o cancer, IVDU.  Disucssed conservative mgmt and will treat with steroids and short course of pain meds. Referral provided for ortho for further mgmt.  Pt aware of plan and in agreement. Return precautions provided.   I personally performed the services described in this documentation, which was scribed in my presence. The recorded information has been reviewed and is accurate.  Filed Vitals:   01/19/15 1200  BP: 116/85  Pulse: 76  Temp: 97.5 F (36.4 C)  TempSrc: Oral  Resp: 20  SpO2: 96%   Meds given in ED:  Medications  oxyCODONE-acetaminophen (PERCOCET/ROXICET) 5-325 MG per tablet 1 tablet (1 tablet Oral Given 01/19/15 1307)  predniSONE (DELTASONE) tablet 60 mg (60 mg Oral Given 01/19/15 1307)    Discharge Medication List as of 01/19/2015  1:03 PM    START taking these medications   Details  HYDROcodone-acetaminophen (NORCO/VICODIN) 5-325 MG per tablet Take 1-2 tablets by mouth every 4 (four) hours as needed., Starting 01/19/2015, Until Discontinued, Print    predniSONE (DELTASONE) 20  MG tablet Take 2 tablets (40 mg total) by mouth daily., Starting 01/19/2015, Until Discontinued, Print         [name], NP 01/20/15 1610  Lyanne Co, MD 01/20/15 [phone]

## 2015-01-19 NOTE — Discharge Instructions (Signed)
Please follow the directions provided. Be sure to follow-up with the orthopedic doctor for further management of your back pain. Please take the prednisone daily for 5 days to help with pain caused by inflammation. You may take the Vicodin for pain for a short time. Don't hesitate to return for any new, worsening, or concerning symptoms.  SEEK IMMEDIATE MEDICAL CARE IF:  You lose control of your bowel or bladder (incontinence).  You have increasing weakness in the lower back, pelvis, buttocks, or legs.  You have redness or swelling of your back.  You have a burning sensation when you urinate.  You have pain that gets worse when you lie down or awakens you at night.  Your pain is worse than you have experienced in the past.  Your pain is lasting longer than 4 weeks.  You are suddenly losing weight without reason.

## 2015-02-07 ENCOUNTER — Ambulatory Visit: Payer: Medicaid Other | Attending: Internal Medicine | Admitting: Internal Medicine

## 2015-02-07 ENCOUNTER — Encounter: Payer: Self-pay | Admitting: Internal Medicine

## 2015-02-07 VITALS — BP 128/85 | HR 65 | Temp 98.2°F | Resp 16 | Wt 215.6 lb

## 2015-02-07 DIAGNOSIS — Z7952 Long term (current) use of systemic steroids: Secondary | ICD-10-CM | POA: Insufficient documentation

## 2015-02-07 DIAGNOSIS — B20 Human immunodeficiency virus [HIV] disease: Secondary | ICD-10-CM

## 2015-02-07 DIAGNOSIS — Z7982 Long term (current) use of aspirin: Secondary | ICD-10-CM | POA: Diagnosis not present

## 2015-02-07 DIAGNOSIS — E785 Hyperlipidemia, unspecified: Secondary | ICD-10-CM | POA: Diagnosis not present

## 2015-02-07 DIAGNOSIS — Z23 Encounter for immunization: Secondary | ICD-10-CM

## 2015-02-07 DIAGNOSIS — G8929 Other chronic pain: Secondary | ICD-10-CM | POA: Diagnosis not present

## 2015-02-07 DIAGNOSIS — Z95810 Presence of automatic (implantable) cardiac defibrillator: Secondary | ICD-10-CM | POA: Diagnosis not present

## 2015-02-07 DIAGNOSIS — Z7951 Long term (current) use of inhaled steroids: Secondary | ICD-10-CM | POA: Insufficient documentation

## 2015-02-07 DIAGNOSIS — I10 Essential (primary) hypertension: Secondary | ICD-10-CM | POA: Diagnosis not present

## 2015-02-07 DIAGNOSIS — I259 Chronic ischemic heart disease, unspecified: Secondary | ICD-10-CM | POA: Insufficient documentation

## 2015-02-07 DIAGNOSIS — I252 Old myocardial infarction: Secondary | ICD-10-CM | POA: Diagnosis not present

## 2015-02-07 DIAGNOSIS — M545 Low back pain: Secondary | ICD-10-CM | POA: Diagnosis present

## 2015-02-07 DIAGNOSIS — I509 Heart failure, unspecified: Secondary | ICD-10-CM | POA: Insufficient documentation

## 2015-02-07 DIAGNOSIS — Z9861 Coronary angioplasty status: Secondary | ICD-10-CM | POA: Diagnosis not present

## 2015-02-07 DIAGNOSIS — Z7902 Long term (current) use of antithrombotics/antiplatelets: Secondary | ICD-10-CM | POA: Diagnosis not present

## 2015-02-07 DIAGNOSIS — I2510 Atherosclerotic heart disease of native coronary artery without angina pectoris: Secondary | ICD-10-CM | POA: Insufficient documentation

## 2015-02-07 DIAGNOSIS — R0981 Nasal congestion: Secondary | ICD-10-CM | POA: Insufficient documentation

## 2015-02-07 DIAGNOSIS — Z760 Encounter for issue of repeat prescription: Secondary | ICD-10-CM

## 2015-02-07 MED ORDER — DRONABINOL 2.5 MG PO CAPS: 2.5000 mg | ORAL_CAPSULE | Freq: Two times a day (BID) | ORAL | Status: AC

## 2015-02-07 MED ORDER — TADALAFIL 20 MG PO TABS: 20.0000 mg | ORAL_TABLET | Freq: Every day | ORAL | Status: AC | PRN

## 2015-02-07 MED ORDER — LISINOPRIL 10 MG PO TABS: 5.0000 mg | ORAL_TABLET | Freq: Every day | ORAL | Status: AC

## 2015-02-07 MED ORDER — NITROGLYCERIN 0.4 MG SL SUBL: 0.4000 mg | SUBLINGUAL_TABLET | SUBLINGUAL | Status: AC | PRN

## 2015-02-07 MED ORDER — FLUTICASONE PROPIONATE 50 MCG/ACT NA SUSP: 2.0000 | Freq: Every day | NASAL | Status: DC

## 2015-02-07 MED ORDER — CLOPIDOGREL BISULFATE 75 MG PO TABS: 75.0000 mg | ORAL_TABLET | Freq: Every day | ORAL | Status: AC

## 2015-02-07 MED ORDER — PROMETHAZINE HCL 50 MG PO TABS: 50.0000 mg | ORAL_TABLET | Freq: Three times a day (TID) | ORAL | Status: AC | PRN

## 2015-02-07 MED ORDER — CARVEDILOL 12.5 MG PO TABS: 12.5000 mg | ORAL_TABLET | Freq: Two times a day (BID) | ORAL | Status: AC

## 2015-02-07 MED ORDER — TRAMADOL HCL 50 MG PO TABS: 50.0000 mg | ORAL_TABLET | Freq: Two times a day (BID) | ORAL | Status: DC | PRN

## 2015-02-07 NOTE — Progress Notes (Signed)
MRN: [id number] Name: Edward Haley  Sex: male Age: 49 y.o. DOB: 1966/06/05  Allergies: Shellfish allergy  Chief Complaint  Patient presents with  . Back Pain    HPI: Patient is 49 y.o. male who history of CAD hypertension hyperlipidemia,HIV, GERD , chronic lower back pain, today is requesting referral to see the orthopedics specialist for his back pain which has been on and off and recently getting worse denies any fever chills any incontinence, is also requesting refill on his medications. Patient also has been following up with ID.  Past Medical History  Diagnosis Date  . HIV infection dx'd 1993  . Hypertension   . Coronary artery disease   . Hyperlipidemia   . Automatic implantable cardioverter-defibrillator in situ   . CHF (congestive heart failure)   . MI (myocardial infarction) 2011  . GERD (gastroesophageal reflux disease)   . History of stomach ulcers   . Daily headache   . Migraine     "qd" (04/26/2014)  . Chronic lower back pain     Past Surgical History  Procedure Laterality Date  . Cardiac defibrillator placement  04/26/2014  . Coronary angioplasty with stent placement  03/11/2013    Dr Salomon Fick [facility] - [place]) - Promus Premiere Monorail 2.5x23mm to RCA & 2.5x53mm to RCA  . Coronary angioplasty with stent placement  02/11/2010    [facility], [place] - Multi-Link 5.0x74mm to LAD  . Tonsillectomy    . Cystectomy  1990's    "2 taken off my bottom" (04/26/2014)  . Implantable cardioverter defibrillator implant N/A 04/26/2014    Procedure: IMPLANTABLE CARDIOVERTER DEFIBRILLATOR IMPLANT;  Surgeon: Thurmon Fair, MD;  Location: [facility] CATH LAB;  Service: Cardiovascular;  Laterality: N/A;      Medication List       This list is accurate as of: 02/07/15  2:28 PM.  Always use your most recent med list.               aspirin EC 325 MG tablet  Take 325 mg by mouth daily.     carvedilol 12.5 MG tablet  Commonly known as:   COREG  Take 1 tablet (12.5 mg total) by mouth 2 (two) times daily with a meal.     clopidogrel 75 MG tablet  Commonly known as:  PLAVIX  Take 1 tablet (75 mg total) by mouth daily with breakfast.     cyclobenzaprine 10 MG tablet  Commonly known as:  FLEXERIL  Take 1 tablet (10 mg total) by mouth 2 (two) times daily as needed for muscle spasms.     diphenhydrAMINE 25 mg capsule  Commonly known as:  BENADRYL  Take 25 mg by mouth 2 (two) times daily.     dronabinol 2.5 MG capsule  Commonly known as:  MARINOL  Take 1 capsule (2.5 mg total) by mouth 2 (two) times daily before a meal.     efavirenz-emtricitabine-tenofovir 600-200-300 MG per tablet  Commonly known as:  ATRIPLA  Take 1 tablet by mouth at bedtime.     ENSURE PLUS Liqd  Take 237 mLs by mouth 2 (two) times daily between meals.     fluticasone 50 MCG/ACT nasal spray  Commonly known as:  FLONASE  Place 2 sprays into both nostrils daily.     gabapentin 100 MG capsule  Commonly known as:  NEURONTIN  Take 100 mg by mouth 3 (three) times daily.     HYDROcodone-acetaminophen 5-325 MG per tablet  Commonly  known as:  NORCO/VICODIN  Take 1-2 tablets by mouth every 4 (four) hours as needed.     lisinopril 10 MG tablet  Commonly known as:  ZESTRIL  Take 0.5 tablets (5 mg total) by mouth daily.     methocarbamol 500 MG tablet  Commonly known as:  ROBAXIN  TAKE ONE TABLET BY MOUTH DAILY AT BEDTIME AS NEEDED FOR MUSCLE SPASMS     multivitamin with minerals Tabs tablet  Take 1 tablet by mouth at bedtime.     nitroGLYCERIN 0.4 MG SL tablet  Commonly known as:  NITROSTAT  Place 1 tablet (0.4 mg total) under the tongue every 5 (five) minutes as needed for chest pain.     predniSONE 20 MG tablet  Commonly known as:  DELTASONE  Take 2 tablets (40 mg total) by mouth daily.     promethazine 50 MG tablet  Commonly known as:  PHENERGAN  Take 1 tablet (50 mg total) by mouth every 8 (eight) hours as needed for nausea or  vomiting.     rosuvastatin 40 MG tablet  Commonly known as:  CRESTOR  Take 1 tablet (40 mg total) by mouth at bedtime.     tadalafil 20 MG tablet  Commonly known as:  CIALIS  Take 1 tablet (20 mg total) by mouth daily as needed for erectile dysfunction.     traMADol 50 MG tablet  Commonly known as:  ULTRAM  Take 1 tablet (50 mg total) by mouth every 12 (twelve) hours as needed.     Vitamin D (Ergocalciferol) 50000 UNITS Caps capsule  Commonly known as:  DRISDOL  Take 1 capsule (50,000 Units total) by mouth every 7 (seven) days.        Meds ordered this encounter  Medications  . carvedilol (COREG) 12.5 MG tablet    Sig: Take 1 tablet (12.5 mg total) by mouth 2 (two) times daily with a meal.    Dispense:  60 tablet    Refill:  11  . clopidogrel (PLAVIX) 75 MG tablet    Sig: Take 1 tablet (75 mg total) by mouth daily with breakfast.    Dispense:  30 tablet    Refill:  11  . dronabinol (MARINOL) 2.5 MG capsule    Sig: Take 1 capsule (2.5 mg total) by mouth 2 (two) times daily before a meal.    Dispense:  60 capsule    Refill:  3    Pass program  . fluticasone (FLONASE) 50 MCG/ACT nasal spray    Sig: Place 2 sprays into both nostrils daily.    Dispense:  16 g    Refill:  2  . lisinopril (ZESTRIL) 10 MG tablet    Sig: Take 0.5 tablets (5 mg total) by mouth daily.    Dispense:  30 tablet    Refill:  6  . nitroGLYCERIN (NITROSTAT) 0.4 MG SL tablet    Sig: Place 1 tablet (0.4 mg total) under the tongue every 5 (five) minutes as needed for chest pain.    Dispense:  12 tablet    Refill:  2  . promethazine (PHENERGAN) 50 MG tablet    Sig: Take 1 tablet (50 mg total) by mouth every 8 (eight) hours as needed for nausea or vomiting.    Dispense:  60 tablet    Refill:  3  . tadalafil (CIALIS) 20 MG tablet    Sig: Take 1 tablet (20 mg total) by mouth daily as needed for erectile dysfunction.    Dispense:  3 tablet    Refill:  0  . traMADol (ULTRAM) 50 MG tablet    Sig: Take 1  tablet (50 mg total) by mouth every 12 (twelve) hours as needed.    Dispense:  30 tablet    Refill:  0    Immunization History  Administered Date(s) Administered  . Hepatitis A 10/30/2005  . Influenza,inj,Quad PF,36+ Mos 11/11/2013, 11/14/2014, 02/07/2015  . Pneumococcal Conjugate-13 09/08/2012  . Pneumococcal Polysaccharide-23 01/10/2009  . Td 08/15/2003  . Tdap 01/10/2009    Family History  Problem Relation Age of Onset  . Stroke Mother   . Hypertension Mother   . Diabetes Mother   . Heart disease Sister   . Cancer Sister     History  Substance Use Topics  . Smoking status: Never Smoker   . Smokeless tobacco: Never Used  . Alcohol Use: No    Review of Systems   As noted in HPI  Filed Vitals:   02/07/15 1409  BP: 128/85  Pulse: 65  Temp: 98.2 F (36.8 C)  Resp: 16    Physical Exam  Physical Exam  Cardiovascular: Normal rate and regular rhythm.   Pulmonary/Chest: Breath sounds normal. No respiratory distress. He has no wheezes. He has no rales.  palpable ICD left side chest wall       Musculoskeletal:  Right lower lumbar paraspinal tenderness     CBC    Component Value Date/Time   WBC 6.4 11/14/2014 1227   RBC 4.99 11/14/2014 1227   HGB 15.3 11/14/2014 1227   HCT 43.8 11/14/2014 1227   PLT 148* 11/14/2014 1227   MCV 87.8 11/14/2014 1227   LYMPHSABS 2.5 11/14/2014 1227   MONOABS 0.3 11/14/2014 1227   EOSABS 0.1 11/14/2014 1227   BASOSABS 0.0 11/14/2014 1227    CMP     Component Value Date/Time   NA 139 11/14/2014 1227   K 4.6 11/14/2014 1227   CL 100 11/14/2014 1227   CO2 28 11/14/2014 1227   GLUCOSE 88 11/14/2014 1227   BUN 9 11/14/2014 1227   CREATININE 0.93 11/14/2014 1227   CALCIUM 9.1 11/14/2014 1227   PROT 7.1 11/14/2014 1227   ALBUMIN 4.5 11/14/2014 1227   AST 21 11/14/2014 1227   ALT 22 11/14/2014 1227   ALKPHOS 64 11/14/2014 1227   BILITOT 0.3 11/14/2014 1227   GFRNONAA >89 11/14/2014 1227   GFRAA >89 11/14/2014 1227      Lab Results  Component Value Date/Time   CHOL 147 07/25/2014 09:20 AM    No components found for: HGA1C  Lab Results  Component Value Date/Time   AST 21 11/14/2014 12:27 PM    Assessment and Plan  Chronic low back pain - Plan: Ambulatory referral to Orthopedic Surgery, traMADol (ULTRAM) 50 MG tablet  Needs flu shot Flu shot given today.  Chronic ischemic heart disease - Plan: carvedilol (COREG) 12.5 MG tablet, lisinopril (ZESTRIL) 10 MG tablet, Following up with cardio.  HIV disease - Plan: clopidogrel (PLAVIX) 75 MG tablet, dronabinol (MARINOL) 2.5 MG capsule, , promethazine (PHENERGAN) 50 MG tablet  Medication refill - Plan: dronabinol (MARINOL) 2.5 MG capsule, tadalafil (CIALIS) 20 MG tablet  Nasal congestion - Plan: fluticasone (FLONASE) 50 MCG/ACT nasal spray   Health Maintenance  -Vaccinations:  -flu shot given today   Return in about 3 months (around 05/08/2015), or if symptoms worsen or fail to improve, for hypertension.  [name], MD

## 2015-02-07 NOTE — Progress Notes (Signed)
Patient here for follow up on his chronic back pain Patient requesting referral to ortho to be evaluated

## 2015-02-11 ENCOUNTER — Encounter (HOSPITAL_COMMUNITY): Payer: Self-pay | Admitting: Nurse Practitioner

## 2015-02-11 ENCOUNTER — Emergency Department (HOSPITAL_COMMUNITY)
Admission: EM | Admit: 2015-02-11 | Discharge: 2015-02-11 | Disposition: A | Discharge status: Home / Self Care | Payer: Medicaid Other | Attending: Emergency Medicine | Admitting: Emergency Medicine

## 2015-02-11 DIAGNOSIS — G43909 Migraine, unspecified, not intractable, without status migrainosus: Secondary | ICD-10-CM | POA: Diagnosis not present

## 2015-02-11 DIAGNOSIS — I509 Heart failure, unspecified: Secondary | ICD-10-CM | POA: Insufficient documentation

## 2015-02-11 DIAGNOSIS — Z9861 Coronary angioplasty status: Secondary | ICD-10-CM | POA: Insufficient documentation

## 2015-02-11 DIAGNOSIS — R110 Nausea: Secondary | ICD-10-CM | POA: Insufficient documentation

## 2015-02-11 DIAGNOSIS — M545 Low back pain: Secondary | ICD-10-CM | POA: Diagnosis present

## 2015-02-11 DIAGNOSIS — Z7982 Long term (current) use of aspirin: Secondary | ICD-10-CM | POA: Insufficient documentation

## 2015-02-11 DIAGNOSIS — E785 Hyperlipidemia, unspecified: Secondary | ICD-10-CM | POA: Diagnosis not present

## 2015-02-11 DIAGNOSIS — M5416 Radiculopathy, lumbar region: Secondary | ICD-10-CM | POA: Insufficient documentation

## 2015-02-11 DIAGNOSIS — Z79899 Other long term (current) drug therapy: Secondary | ICD-10-CM | POA: Diagnosis not present

## 2015-02-11 DIAGNOSIS — Z7902 Long term (current) use of antithrombotics/antiplatelets: Secondary | ICD-10-CM | POA: Insufficient documentation

## 2015-02-11 DIAGNOSIS — Z7952 Long term (current) use of systemic steroids: Secondary | ICD-10-CM | POA: Diagnosis not present

## 2015-02-11 DIAGNOSIS — Z95810 Presence of automatic (implantable) cardiac defibrillator: Secondary | ICD-10-CM | POA: Insufficient documentation

## 2015-02-11 DIAGNOSIS — I2510 Atherosclerotic heart disease of native coronary artery without angina pectoris: Secondary | ICD-10-CM | POA: Diagnosis not present

## 2015-02-11 DIAGNOSIS — Z21 Asymptomatic human immunodeficiency virus [HIV] infection status: Secondary | ICD-10-CM | POA: Diagnosis not present

## 2015-02-11 DIAGNOSIS — Z8719 Personal history of other diseases of the digestive system: Secondary | ICD-10-CM | POA: Diagnosis not present

## 2015-02-11 DIAGNOSIS — Z7951 Long term (current) use of inhaled steroids: Secondary | ICD-10-CM | POA: Insufficient documentation

## 2015-02-11 DIAGNOSIS — I252 Old myocardial infarction: Secondary | ICD-10-CM | POA: Insufficient documentation

## 2015-02-11 DIAGNOSIS — I10 Essential (primary) hypertension: Secondary | ICD-10-CM | POA: Diagnosis not present

## 2015-02-11 DIAGNOSIS — G8929 Other chronic pain: Secondary | ICD-10-CM

## 2015-02-11 MED ORDER — METHOCARBAMOL 750 MG PO TABS: 750.0000 mg | ORAL_TABLET | Freq: Four times a day (QID) | ORAL | Status: DC | PRN

## 2015-02-11 NOTE — ED Notes (Signed)
He reports hx chronic lower back pain. He saw his PCP last week and was given tramadol RX and set up with ortho appt next week. He states the tramadol is not working and he needs something else for pain until he sees ortho MD

## 2015-02-11 NOTE — ED Provider Notes (Signed)
CSN: [id number]     Arrival date & time 02/11/15  1242 History  This chart was scribed for non-physician practitioner, Trixie Dredge, PA-C working with Juliet Rude. Rubin Payor, MD by Freida Busman, ED Scribe. This patient was seen in room TR05C/TR05C and the patient's care was started at 1:49 PM.    Chief Complaint  Patient presents with  . Back Pain    HPI   HPI Comments:  Edward Haley is a 49 y.o. male with a h/o chronic low back pain who presents to the Emergency Department complaining of increased back pain for a few days. His pain radiates down his RLE. He describes the pain in his leg as a throbbing pain. He reports increased pain with movement. He also reports associated nausea. He denies weakness in the extremity, acute injury/trauma, dysuria, urinary urgency, penile discharge, bladder/bowel incontinence, and fever.  He has taken tramadol in the past for his pain without relief. He has an orthopedic appointment for the 18th or 19th of Feb 2016 He admits to marijuana use.  Denies any abrupt or significant change in his chronic pain.  Cardiologist is Dr. Rennis Golden   Past Medical History  Diagnosis Date  . HIV infection dx'd 1993  . Hypertension   . Coronary artery disease   . Hyperlipidemia   . Automatic implantable cardioverter-defibrillator in situ   . CHF (congestive heart failure)   . MI (myocardial infarction) 2011  . GERD (gastroesophageal reflux disease)   . History of stomach ulcers   . Daily headache   . Migraine     "qd" (04/26/2014)  . Chronic lower back pain    Past Surgical History  Procedure Laterality Date  . Cardiac defibrillator placement  04/26/2014  . Coronary angioplasty with stent placement  03/11/2013    Dr Salomon Fick [facility] - [place]) - Promus Premiere Monorail 2.5x41mm to RCA & 2.5x65mm to RCA  . Coronary angioplasty with stent placement  02/11/2010    [facility], [place] - Multi-Link 5.0x71mm to LAD  . Tonsillectomy    .  Cystectomy  1990's    "2 taken off my bottom" (04/26/2014)  . Implantable cardioverter defibrillator implant N/A 04/26/2014    Procedure: IMPLANTABLE CARDIOVERTER DEFIBRILLATOR IMPLANT;  Surgeon: Thurmon Fair, MD;  Location: [facility] CATH LAB;  Service: Cardiovascular;  Laterality: N/A;   Family History  Problem Relation Age of Onset  . Stroke Mother   . Hypertension Mother   . Diabetes Mother   . Heart disease Sister   . Cancer Sister    History  Substance Use Topics  . Smoking status: Never Smoker   . Smokeless tobacco: Never Used  . Alcohol Use: No    Review of Systems  Constitutional: Negative for fever.  Gastrointestinal: Positive for nausea. Negative for rectal pain.  Genitourinary: Negative for dysuria and discharge.  Musculoskeletal: Positive for back pain.      Allergies  Shellfish allergy  Home Medications   Prior to Admission medications   Medication Sig Start Date End Date Taking? Authorizing Provider  aspirin EC 325 MG tablet Take 325 mg by mouth daily.    Historical Provider, MD  carvedilol (COREG) 12.5 MG tablet Take 1 tablet (12.5 mg total) by mouth 2 (two) times daily with a meal. 02/07/15   [name], MD  clopidogrel (PLAVIX) 75 MG tablet Take 1 tablet (75 mg total) by mouth daily with breakfast. 02/07/15   [name], MD  cyclobenzaprine (FLEXERIL) 10 MG tablet Take 1  tablet (10 mg total) by mouth 2 (two) times daily as needed for muscle spasms. 10/31/14   Kaitlyn Szekalski, PA-C  diphenhydrAMINE (BENADRYL) 25 mg capsule Take 25 mg by mouth 2 (two) times daily.     Historical Provider, MD  dronabinol (MARINOL) 2.5 MG capsule Take 1 capsule (2.5 mg total) by mouth 2 (two) times daily before a meal. 02/07/15   [name], MD  efavirenz-emtricitabine-tenofovir (ATRIPLA) 600-200-300 MG per tablet Take 1 tablet by mouth at bedtime. 12/09/14   [name], MD  ENSURE PLUS (ENSURE PLUS) LIQD Take 237 mLs by mouth 2 (two) times daily between meals.    Historical  Provider, MD  fluticasone (FLONASE) 50 MCG/ACT nasal spray Place 2 sprays into both nostrils daily. 02/07/15   [name], MD  gabapentin (NEURONTIN) 100 MG capsule Take 100 mg by mouth 3 (three) times daily.    Historical Provider, MD  HYDROcodone-acetaminophen (NORCO/VICODIN) 5-325 MG per tablet Take 1-2 tablets by mouth every 4 (four) hours as needed. 01/19/15   [name], NP  lisinopril (ZESTRIL) 10 MG tablet Take 0.5 tablets (5 mg total) by mouth daily. 02/07/15   [name], MD  methocarbamol (ROBAXIN) 500 MG tablet TAKE ONE TABLET BY MOUTH DAILY AT BEDTIME AS NEEDED FOR MUSCLE SPASMS 08/08/14   [name], MD  Multiple Vitamin (MULTIVITAMIN WITH MINERALS) TABS tablet Take 1 tablet by mouth at bedtime.    Historical Provider, MD  nitroGLYCERIN (NITROSTAT) 0.4 MG SL tablet Place 1 tablet (0.4 mg total) under the tongue every 5 (five) minutes as needed for chest pain. 02/07/15   [name], MD  predniSONE (DELTASONE) 20 MG tablet Take 2 tablets (40 mg total) by mouth daily. 01/19/15   [name], NP  promethazine (PHENERGAN) 50 MG tablet Take 1 tablet (50 mg total) by mouth every 8 (eight) hours as needed for nausea or vomiting. 02/07/15   [name], MD  rosuvastatin (CRESTOR) 40 MG tablet Take 1 tablet (40 mg total) by mouth at bedtime. 03/15/14   [name], MD  tadalafil (CIALIS) 20 MG tablet Take 1 tablet (20 mg total) by mouth daily as needed for erectile dysfunction. 02/07/15   [name], MD  traMADol (ULTRAM) 50 MG tablet Take 1 tablet (50 mg total) by mouth every 12 (twelve) hours as needed. 02/07/15   [name], MD  Vitamin D, Ergocalciferol, (DRISDOL) 50000 UNITS CAPS capsule Take 1 capsule (50,000 Units total) by mouth every 7 (seven) days. 03/15/14   [name], MD   BP 109/74 mmHg  Pulse 88  Temp(Src) 98 F (36.7 C) (Oral)  Resp 18  Ht  (1.854 m)  Wt 207 lb (93.895 kg)  BMI 27.32 kg/m2  SpO2 98% Physical Exam  Constitutional: He  appears well-developed and well-nourished. No distress.  HENT:  Head: Normocephalic and atraumatic.  Neck: Neck supple.  Pulmonary/Chest: Effort normal.  Abdominal: Soft. He exhibits no distension. There is no tenderness. There is no rebound and no guarding.  Musculoskeletal:  Spine nontender, no crepitus, or stepoffs. Lower extremities:  Strength 5/5, sensation intact, distal pulses intact.     Neurological: He is alert.  Skin: He is not diaphoretic.  Nursing note and vitals reviewed.   ED Course  Procedures    DIAGNOSTIC STUDIES:  Oxygen Saturation is 98% on RA, normal by my interpretation.    COORDINATION OF CARE:  2:00 PM Will order pain meds. Discussed treatment plan with pt at bedside and pt agreed to plan.  Labs  Review Labs Reviewed - No data to display  Imaging Review No results found.   EKG Interpretation None      MDM   Final diagnoses:  Chronic radicular low back pain    Afebrile, nontoxic patient with exacerbation of chronic back pain.  No red flags with history or exam.  Neurovascularly intact.  Emergent imaging not indicated at this time.   D/C home with robaxin, orthopedic/PCP follow up.  Discussed result, findings, treatment, and follow up  with patient.  Pt given return precautions.  Pt verbalizes understanding and agrees with plan.         I personally performed the services described in this documentation, which was scribed in my presence. The recorded information has been reviewed and is accurate.    Trixie Dredge, PA-C 02/11/15 1604  Juliet Rude. Rubin Payor, MD 02/12/15 [phone]

## 2015-02-11 NOTE — Discharge Instructions (Signed)
Read the information below.  Use the prescribed medication as directed.  Please discuss all new medications with your pharmacist.  You may return to the Emergency Department at any time for worsening condition or any new symptoms that concern you.    If you develop fevers, loss of control of bowel or bladder, weakness or numbness in your legs, or are unable to walk, return to the ER for a recheck.  ° ° ° °Chronic Back Pain ° When back pain lasts longer than 3 months, it is called chronic back pain. People with chronic back pain often go through certain periods that are more intense (flare-ups).  °CAUSES °Chronic back pain can be caused by wear and tear (degeneration) on different structures in your back. These structures include: °· The bones of your spine (vertebrae) and the joints surrounding your spinal cord and nerve roots (facets). °· The strong, fibrous tissues that connect your vertebrae (ligaments). °Degeneration of these structures may result in pressure on your nerves. This can lead to constant pain. °HOME CARE INSTRUCTIONS °· Avoid bending, heavy lifting, prolonged sitting, and activities which make the problem worse. °· Take brief periods of rest throughout the day to reduce your pain. Lying down or standing usually is better than sitting while you are resting. °· Take over-the-counter or prescription medicines only as directed by your caregiver. °SEEK IMMEDIATE MEDICAL CARE IF:  °· You have weakness or numbness in one of your legs or feet. °· You have trouble controlling your bladder or bowels. °· You have nausea, vomiting, abdominal pain, shortness of breath, or fainting. °Document Released: 01/23/2005 Document Revised: 03/09/2012 Document Reviewed: 11/30/2011 °ExitCare® Patient Information ©2015 [employer]. This information is not intended to replace advice given to you by your health care provider. Make sure you discuss any questions you have with your health care provider. ° °

## 2015-02-16 ENCOUNTER — Emergency Department (HOSPITAL_COMMUNITY)
Admission: EM | Admit: 2015-02-16 | Discharge: 2015-02-16 | Disposition: A | Discharge status: Home / Self Care | Payer: Self-pay | Source: Home / Self Care

## 2015-02-16 ENCOUNTER — Telehealth: Payer: Self-pay

## 2015-02-16 ENCOUNTER — Telehealth: Payer: Self-pay | Admitting: Internal Medicine

## 2015-02-16 DIAGNOSIS — M5489 Other dorsalgia: Secondary | ICD-10-CM

## 2015-02-16 NOTE — Telephone Encounter (Signed)
Patient came into facility to speak to nurse about referral. Patient is upset and would like to speak to somebody today. Please f/u with pt.

## 2015-02-16 NOTE — Telephone Encounter (Signed)
Patient came into facility to speak with a nurse about his referral Was not happy with orthopedic and was requesting a referral to pain management i explained to patient he needs to get a copy of his xray on a CD so the specialist can view and  Prescribe what is needed Referral for pain management placed in Epic Patient was satisfied with today's outcome

## 2015-02-18 ENCOUNTER — Emergency Department (INDEPENDENT_AMBULATORY_CARE_PROVIDER_SITE_OTHER)
Admission: EM | Admit: 2015-02-18 | Discharge: 2015-02-18 | Disposition: A | Discharge status: Home / Self Care | Payer: Self-pay | Source: Home / Self Care | Attending: Emergency Medicine | Admitting: Emergency Medicine

## 2015-02-18 ENCOUNTER — Encounter (HOSPITAL_COMMUNITY): Payer: Self-pay | Admitting: *Deleted

## 2015-02-18 DIAGNOSIS — M549 Dorsalgia, unspecified: Secondary | ICD-10-CM

## 2015-02-18 DIAGNOSIS — J111 Influenza due to unidentified influenza virus with other respiratory manifestations: Secondary | ICD-10-CM

## 2015-02-18 DIAGNOSIS — G8929 Other chronic pain: Secondary | ICD-10-CM

## 2015-02-18 MED ORDER — OSELTAMIVIR PHOSPHATE 75 MG PO CAPS: 75.0000 mg | ORAL_CAPSULE | Freq: Two times a day (BID) | ORAL | Status: AC

## 2015-02-18 MED ORDER — HYDROCODONE-HOMATROPINE 5-1.5 MG/5ML PO SYRP: 5.0000 mL | ORAL_SOLUTION | Freq: Four times a day (QID) | ORAL | Status: AC | PRN

## 2015-02-18 NOTE — Discharge Instructions (Signed)
You have the flu or something similar. Take Tamiflu 1 pill twice a day for 5 days. Make sure you're drinking plenty of fluids and get enough rest. Continue to use your Flonase. Use the Hycodan cough syrup every 4-6 hours as needed for cough. This will also help your back pain.  Do not drive while taking this medicine. If you develop fevers, difficulty breathing, or are just getting worse please go to the emergency room.

## 2015-02-18 NOTE — ED Provider Notes (Signed)
CSN: [id number]     Arrival date & time 02/18/15  1142 History   First MD Initiated Contact with Patient 02/18/15 1213     Chief Complaint  Patient presents with  . Cough  . Back Pain   (Consider location/radiation/quality/duration/timing/severity/associated sxs/prior Treatment) HPI  He is a 49 year old man here for evaluation of cough. He states this started Friday morning. The cough is productive of greenish sputum. He also reports watery eyes, rhinorrhea, sore throat, subjective fevers, body aches. With all the coughing, his chronic low back pain is worse. He has seen an orthopedic doctor earlier this week and has been set up with a pain management clinic as well as physical therapy. He states he got a flu shot one week ago. He has been using Flonase, vitamin C without improvement.  Past Medical History  Diagnosis Date  . HIV infection dx'd 1993  . Hypertension   . Coronary artery disease   . Hyperlipidemia   . Automatic implantable cardioverter-defibrillator in situ   . CHF (congestive heart failure)   . MI (myocardial infarction) 2011    x3  . GERD (gastroesophageal reflux disease)   . History of stomach ulcers   . Daily headache   . Migraine     "qd" (04/26/2014)  . Chronic lower back pain    Past Surgical History  Procedure Laterality Date  . Cardiac defibrillator placement  04/26/2014  . Coronary angioplasty with stent placement  03/11/2013    Dr Salomon Fick [facility] [place]) - Promus Premiere Monorail 2.5x61mm to RCA & 2.5x73mm to RCA  . Coronary angioplasty with stent placement  02/11/2010    [facility], [place] - Multi-Link 5.0x71mm to LAD  . Tonsillectomy    . Cystectomy  1990's    "2 taken off my bottom" (04/26/2014)  . Implantable cardioverter defibrillator implant N/A 04/26/2014    Procedure: IMPLANTABLE CARDIOVERTER DEFIBRILLATOR IMPLANT;  Surgeon: Thurmon Fair, MD;  Location: [facility] CATH [facility];  Service: Cardiovascular;  Laterality:  N/A;   Family History  Problem Relation Age of Onset  . Stroke Mother   . Hypertension Mother   . Diabetes Mother   . Heart disease Sister   . Cancer Sister    History  Substance Use Topics  . Smoking status: Never Smoker   . Smokeless tobacco: Never Used  . Alcohol Use: No    Review of Systems  Constitutional: Positive for fever.  HENT: Positive for rhinorrhea and sore throat. Negative for congestion.   Eyes: Positive for discharge.  Respiratory: Positive for cough. Negative for shortness of breath.   Gastrointestinal: Positive for nausea and vomiting. Negative for abdominal pain.  Musculoskeletal: Positive for myalgias and back pain.    Allergies  Shellfish allergy  Home Medications   Prior to Admission medications   Medication Sig Start Date End Date Taking? Authorizing Provider  aspirin EC 325 MG tablet Take 325 mg by mouth daily.   Yes Historical Provider, MD  carvedilol (COREG) 12.5 MG tablet Take 1 tablet (12.5 mg total) by mouth 2 (two) times daily with a meal. 02/07/15  Yes [name], MD  clopidogrel (PLAVIX) 75 MG tablet Take 1 tablet (75 mg total) by mouth daily with breakfast. 02/07/15  Yes [name], MD  diphenhydrAMINE (BENADRYL) 25 mg capsule Take 25 mg by mouth 2 (two) times daily.    Yes Historical Provider, MD  dronabinol (MARINOL) 2.5 MG capsule Take 1 capsule (2.5 mg total) by mouth 2 (two) times daily  before a meal. 02/07/15  Yes Deepak Advani, MD  efavirenz-emtricitabine-tenofovir (ATRIPLA) 600-200-300 MG per tablet Take 1 tablet by mouth at bedtime. 12/09/14  Yes [name], MD  fluticasone (FLONASE) 50 MCG/ACT nasal spray Place 2 sprays into both nostrils daily. 02/07/15  Yes [name], MD  gabapentin (NEURONTIN) 100 MG capsule Take 100 mg by mouth 3 (three) times daily.   Yes Historical Provider, MD  lisinopril (ZESTRIL) 10 MG tablet Take 0.5 tablets (5 mg total) by mouth daily. 02/07/15  Yes [name], MD  Multiple Vitamin (MULTIVITAMIN  WITH MINERALS) TABS tablet Take 1 tablet by mouth at bedtime.   Yes Historical Provider, MD  nitroGLYCERIN (NITROSTAT) 0.4 MG SL tablet Place 1 tablet (0.4 mg total) under the tongue every 5 (five) minutes as needed for chest pain. 02/07/15  Yes [name], MD  promethazine (PHENERGAN) 50 MG tablet Take 1 tablet (50 mg total) by mouth every 8 (eight) hours as needed for nausea or vomiting. 02/07/15  Yes [name], MD  rosuvastatin (CRESTOR) 40 MG tablet Take 1 tablet (40 mg total) by mouth at bedtime. 03/15/14  Yes [name], MD  tadalafil (CIALIS) 20 MG tablet Take 1 tablet (20 mg total) by mouth daily as needed for erectile dysfunction. 02/07/15  Yes [name], MD  cyclobenzaprine (FLEXERIL) 10 MG tablet Take 1 tablet (10 mg total) by mouth 2 (two) times daily as needed for muscle spasms. 10/31/14   Kaitlyn Szekalski, PA-C  ENSURE PLUS (ENSURE PLUS) LIQD Take 237 mLs by mouth 2 (two) times daily between meals.    Historical Provider, MD  HYDROcodone-acetaminophen (NORCO/VICODIN) 5-325 MG per tablet Take 1-2 tablets by mouth every 4 (four) hours as needed. 01/19/15   [name], NP  HYDROcodone-homatropine [facility]) 5-1.5 MG/5ML syrup Take 5 mLs by mouth every 6 (six) hours as needed for cough. 02/18/15   [name], MD  methocarbamol (ROBAXIN) 750 MG tablet Take 1 tablet (750 mg total) by mouth every 6 (six) hours as needed for muscle spasms (and pain). 02/11/15   Trixie Dredge, PA-C  oseltamivir (TAMIFLU) 75 MG capsule Take 1 capsule (75 mg total) by mouth every 12 (twelve) hours. 02/18/15   [name], MD  predniSONE (DELTASONE) 20 MG tablet Take 2 tablets (40 mg total) by mouth daily. 01/19/15   [name], NP  traMADol (ULTRAM) 50 MG tablet Take 1 tablet (50 mg total) by mouth every 12 (twelve) hours as needed. 02/07/15   [name], MD  Vitamin D, Ergocalciferol, (DRISDOL) 50000 UNITS CAPS capsule Take 1 capsule (50,000 Units total) by mouth every 7 (seven) days. 03/15/14    Deepak Advani, MD   BP 120/82 mmHg  Pulse 99  Temp(Src) 99.7 F (37.6 C) (Oral)  Resp 20  SpO2 99% Physical Exam  Constitutional: He is oriented to person, place, and time. He appears well-developed and well-nourished. No distress.  HENT:  Nose: Rhinorrhea present.  Mouth/Throat: Oropharynx is clear and moist. No oropharyngeal exudate.  Eyes: Right eye exhibits no discharge. Left eye exhibits no discharge.  Neck: Neck supple.  Cardiovascular: Normal rate, regular rhythm and normal heart sounds.   No murmur heard. Pulmonary/Chest: Effort normal and breath sounds normal. No respiratory distress. He has no wheezes. He has no rales.  Lymphadenopathy:    He has no cervical adenopathy.  Neurological: He is alert and oriented to person, place, and time.    ED Course  Procedures (including critical care time) Labs Review Labs Reviewed - No  data to display  Imaging Review No results found.   MDM   1. Flu   2. Back pain, chronic    We'll treat with Tamiflu. Recommended plenty of fluids and rest. Prescription for Hycodan cough syrup provided to use as needed for cough. Discussed that this will also help his back pain. Reviewed reasons to return as in after visit summary.    [name], MD 02/18/15 [phone]

## 2015-02-18 NOTE — ED Notes (Signed)
Started with productive cough w/ yellow sputum, runny nose, sore throat, chills, feeling feverish x 1.5 days.  Has had some nausea & vomiting - taking phenergan prn.  States cough has flared up chronic back pain.  Saw ortho last wk - was told he cannot prescribe pain meds & to go to [facility] or ED for pain meds.  Is in process of getting Pain Clinic & PT appts.

## 2015-02-23 ENCOUNTER — Ambulatory Visit (INDEPENDENT_AMBULATORY_CARE_PROVIDER_SITE_OTHER): Payer: Medicaid Other | Admitting: Internal Medicine

## 2015-02-23 ENCOUNTER — Other Ambulatory Visit (HOSPITAL_COMMUNITY)
Admission: RE | Admit: 2015-02-23 | Discharge: 2015-02-23 | Disposition: A | Discharge status: Home / Self Care | Payer: Medicaid Other | Source: Ambulatory Visit | Attending: Internal Medicine | Admitting: Internal Medicine

## 2015-02-23 ENCOUNTER — Encounter: Payer: Self-pay | Admitting: Internal Medicine

## 2015-02-23 VITALS — BP 153/98 | HR 76 | Temp 97.8°F | Wt 208.0 lb

## 2015-02-23 DIAGNOSIS — Z79899 Other long term (current) drug therapy: Secondary | ICD-10-CM

## 2015-02-23 DIAGNOSIS — Z113 Encounter for screening for infections with a predominantly sexual mode of transmission: Secondary | ICD-10-CM | POA: Insufficient documentation

## 2015-02-23 DIAGNOSIS — M5440 Lumbago with sciatica, unspecified side: Secondary | ICD-10-CM

## 2015-02-23 DIAGNOSIS — Z21 Asymptomatic human immunodeficiency virus [HIV] infection status: Secondary | ICD-10-CM

## 2015-02-23 LAB — URINALYSIS
BILIRUBIN URINE: NEGATIVE
GLUCOSE, UA: NEGATIVE mg/dL
HGB URINE DIPSTICK: NEGATIVE
Ketones, ur: NEGATIVE mg/dL
Leukocytes, UA: NEGATIVE
Nitrite: NEGATIVE
PROTEIN: NEGATIVE mg/dL
Specific Gravity, Urine: 1.015 (ref 1.005–1.030)
Urobilinogen, UA: 0.2 mg/dL (ref 0.0–1.0)
pH: 8 (ref 5.0–8.0)

## 2015-02-23 LAB — LIPID PANEL
CHOL/HDL RATIO: 3.1 ratio
Cholesterol: 138 mg/dL (ref 0–200)
HDL: 44 mg/dL (ref >=40)
LDL CALC: 78 mg/dL (ref 0–99)
Triglycerides: 78 mg/dL (ref <150)
VLDL: 16 mg/dL (ref 0–40)

## 2015-02-23 LAB — CBC WITH DIFFERENTIAL/PLATELET
BASOS PCT: 0 % (ref 0–1)
Basophils Absolute: 0 10*3/uL (ref 0.0–0.1)
EOS ABS: 0.1 10*3/uL (ref 0.0–0.7)
Eosinophils Relative: 2 % (ref 0–5)
HCT: 43 % (ref 39.0–52.0)
HEMOGLOBIN: 14.8 g/dL (ref 13.0–17.0)
Lymphocytes Relative: 55 % — ABNORMAL HIGH (ref 12–46)
Lymphs Abs: 2.1 10*3/uL (ref 0.7–4.0)
MCH: 30.7 pg (ref 26.0–34.0)
MCHC: 34.4 g/dL (ref 30.0–36.0)
MCV: 89.2 fL (ref 78.0–100.0)
MONO ABS: 0.3 10*3/uL (ref 0.1–1.0)
MPV: 9.9 fL (ref 8.6–12.4)
Monocytes Relative: 7 % (ref 3–12)
NEUTROS PCT: 36 % — AB (ref 43–77)
Neutro Abs: 1.4 10*3/uL — ABNORMAL LOW (ref 1.7–7.7)
Platelets: 154 10*3/uL (ref 150–400)
RBC: 4.82 MIL/uL (ref 4.22–5.81)
RDW: 15.3 % (ref 11.5–15.5)
WBC: 3.9 10*3/uL — ABNORMAL LOW (ref 4.0–10.5)

## 2015-02-23 LAB — COMPLETE METABOLIC PANEL WITH GFR
ALBUMIN: 4.4 g/dL (ref 3.5–5.2)
ALT: 15 U/L (ref 0–53)
AST: 18 U/L (ref 0–37)
Alkaline Phosphatase: 70 U/L (ref 39–117)
BILIRUBIN TOTAL: 0.3 mg/dL (ref 0.2–1.2)
BUN: 8 mg/dL (ref 6–23)
CO2: 28 mEq/L (ref 19–32)
Calcium: 9.3 mg/dL (ref 8.4–10.5)
Chloride: 105 mEq/L (ref 96–112)
Creat: 0.93 mg/dL (ref 0.50–1.35)
GFR, Est African American: >89 mL/min
GFR, Est Non African American: >89 mL/min
Glucose, Bld: 100 mg/dL — ABNORMAL HIGH (ref 70–99)
POTASSIUM: 4.3 meq/L (ref 3.5–5.3)
SODIUM: 141 meq/L (ref 135–145)
TOTAL PROTEIN: 6.8 g/dL (ref 6.0–8.3)

## 2015-02-23 MED ORDER — ELVITEG-COBIC-EMTRICIT-TENOFDF 150-150-200-300 MG PO TABS
1.0000 | ORAL_TABLET | Freq: Every day | ORAL | Status: AC
Start: 2015-02-23 — End: ?

## 2015-02-23 NOTE — Progress Notes (Signed)
Patient ID: ORVAN WILMOTT, male   DOB: 1966-07-05, 49 y.o.   MRN: [id number]       Patient ID: KELTEN HOCHSTEIN, male   DOB: 10-14-1966, 49 y.o.   MRN: [id number]  HPI  49yo M with HIV disease, well controlled on atripla. CD 4 count of 970/VL <20 (nov 2015). Has been seeing Dr. Roda Shutters for back pain and referral to pain management. Went to ed with cough, runny nose, fevers with his significant other also being sick with ILI on 2/20. He was given tamiflu and cough syrup that helped his symptoms, treated empirically for flu but not tested.  Outpatient Encounter Prescriptions as of 02/23/2015  Medication Sig  . aspirin EC 325 MG tablet Take 325 mg by mouth daily.  . carvedilol (COREG) 12.5 MG tablet Take 1 tablet (12.5 mg total) by mouth 2 (two) times daily with a meal.  . clopidogrel (PLAVIX) 75 MG tablet Take 1 tablet (75 mg total) by mouth daily with breakfast.  . dronabinol (MARINOL) 2.5 MG capsule Take 1 capsule (2.5 mg total) by mouth 2 (two) times daily before a meal.  . efavirenz-emtricitabine-tenofovir (ATRIPLA) 600-200-300 MG per tablet Take 1 tablet by mouth at bedtime.  . fluticasone (FLONASE) 50 MCG/ACT nasal spray Place 2 sprays into both nostrils daily.  Marland Kitchen gabapentin (NEURONTIN) 100 MG capsule Take 100 mg by mouth 3 (three) times daily.  Marland Kitchen lisinopril (ZESTRIL) 10 MG tablet Take 0.5 tablets (5 mg total) by mouth daily.  . Multiple Vitamin (MULTIVITAMIN WITH MINERALS) TABS tablet Take 1 tablet by mouth at bedtime.  . nitroGLYCERIN (NITROSTAT) 0.4 MG SL tablet Place 1 tablet (0.4 mg total) under the tongue every 5 (five) minutes as needed for chest pain.  . promethazine (PHENERGAN) 50 MG tablet Take 1 tablet (50 mg total) by mouth every 8 (eight) hours as needed for nausea or vomiting.  . rosuvastatin (CRESTOR) 40 MG tablet Take 1 tablet (40 mg total) by mouth at bedtime.  . tadalafil (CIALIS) 20 MG tablet Take 1 tablet (20 mg total) by mouth daily as needed for erectile dysfunction.  .  Vitamin D, Ergocalciferol, (DRISDOL) 50000 UNITS CAPS capsule Take 1 capsule (50,000 Units total) by mouth every 7 (seven) days.  . cyclobenzaprine (FLEXERIL) 10 MG tablet Take 1 tablet (10 mg total) by mouth 2 (two) times daily as needed for muscle spasms. (Patient not taking: Reported on 02/23/2015)  . diphenhydrAMINE (BENADRYL) 25 mg capsule Take 25 mg by mouth 2 (two) times daily.   Marland Kitchen ENSURE PLUS (ENSURE PLUS) LIQD Take 237 mLs by mouth 2 (two) times daily between meals.  Marland Kitchen HYDROcodone-homatropine (HYCODAN) 5-1.5 MG/5ML syrup Take 5 mLs by mouth every 6 (six) hours as needed for cough. (Patient not taking: Reported on 02/23/2015)  . oseltamivir (TAMIFLU) 75 MG capsule Take 1 capsule (75 mg total) by mouth every 12 (twelve) hours. (Patient not taking: Reported on 02/23/2015)  . predniSONE (DELTASONE) 20 MG tablet Take 2 tablets (40 mg total) by mouth daily. (Patient not taking: Reported on 02/23/2015)  . [DISCONTINUED] HYDROcodone-acetaminophen (NORCO/VICODIN) 5-325 MG per tablet Take 1-2 tablets by mouth every 4 (four) hours as needed. (Patient not taking: Reported on 02/23/2015)  . [DISCONTINUED] methocarbamol (ROBAXIN) 750 MG tablet Take 1 tablet (750 mg total) by mouth every 6 (six) hours as needed for muscle spasms (and pain). (Patient not taking: Reported on 02/23/2015)  . [DISCONTINUED] traMADol (ULTRAM) 50 MG tablet Take 1 tablet (50 mg total) by mouth every 12 (twelve) hours  as needed. (Patient not taking: Reported on 02/23/2015)     Patient Active Problem List   Diagnosis Date Noted  . Unspecified vitamin D deficiency 06/09/2014  . HLD (hyperlipidemia) 04/27/2014  . ICD (implantable cardioverter-defibrillator) in place 04/26/2014  . ED (erectile dysfunction) 03/11/2014  . Hip pain 03/11/2014  . Chronic low back pain 03/11/2014  . CAD (coronary artery disease) 02/14/2014  . Ischemic cardiomyopathy, EF 25-30% 02/28/14 02/14/2014  . Suspect Left ventricular aneurysm (ST elevation anteriorly)  02/14/2014  . Old anterior myocardial infarction 02/14/2014  . Viral syndrome 12/10/2013  . Human immunodeficiency virus (HIV) disease 10/13/2013     There are no preventive care reminders to display for this patient.   Review of Systems Constitutional: Negative for fever, chills, diaphoresis, activity change, appetite change, fatigue and unexpected weight change.  HENT: Negative for congestion, sore throat, rhinorrhea, sneezing, trouble swallowing and sinus pressure.  Eyes: Negative for photophobia and visual disturbance.  Respiratory: Negative for cough, chest tightness, shortness of breath, wheezing and stridor.  Cardiovascular: Negative for chest pain, palpitations and leg swelling.  Gastrointestinal: Negative for nausea, vomiting, abdominal pain, diarrhea, constipation, blood in stool, abdominal distention and anal bleeding.  Genitourinary: Negative for dysuria, hematuria, flank pain and difficulty urinating.  Musculoskeletal: low back pain, radiating to flanks Skin: Negative for color change, pallor, rash and wound.  Neurological: Negative for dizziness, tremors, weakness and light-headedness.  Hematological: Negative for adenopathy. Does not bruise/bleed easily.  Psychiatric/Behavioral: Negative for behavioral problems, confusion, sleep disturbance, dysphoric mood, decreased concentration and agitation.    Physical Exam   BP 153/98 mmHg  Pulse 76  Temp(Src) 97.8 F (36.6 C) (Oral)  Wt 208 lb (94.348 kg) Physical Exam  Constitutional: He is oriented to person, place, and time. He appears well-developed and well-nourished. No distress.  HENT:  Mouth/Throat: Oropharynx is clear and moist. No oropharyngeal exudate.  Cardiovascular: Normal rate, regular rhythm and normal heart sounds. Exam reveals no gallop and no friction rub.  No murmur heard.  Pulmonary/Chest: Effort normal and breath sounds normal. No respiratory distress. He has no wheezes.  Abdominal: Soft. Bowel sounds  are normal. He exhibits no distension. There is no tenderness.  Lymphadenopathy:  He has no cervical adenopathy.  Back: tenderness to spinous process at l4-5 with radiating to flank, some increased tightness of paraspinal muscles Neurological: He is alert and oriented to person, place, and time.  Skin: Skin is warm and dry. No rash noted. No erythema.  Psychiatric: He has a normal mood and affect. His behavior is normal.    Lab Results  Component Value Date   CD4TCELL 41 11/14/2014   Lab Results  Component Value Date   CD4TABS 970 11/14/2014   CD4TABS 850 07/25/2014   CD4TABS 1000 02/28/2014   Lab Results  Component Value Date   HIV1RNAQUANT <20 11/14/2014   Lab Results  Component Value Date   HEPBSAB POS* 03/15/2014   No results found for: RPR  CBC Lab Results  Component Value Date   WBC 6.4 11/14/2014   RBC 4.99 11/14/2014   HGB 15.3 11/14/2014   HCT 43.8 11/14/2014   PLT 148* 11/14/2014   MCV 87.8 11/14/2014   MCH 30.7 11/14/2014   MCHC 34.9 11/14/2014   RDW 14.6 11/14/2014   LYMPHSABS 2.5 11/14/2014   MONOABS 0.3 11/14/2014   EOSABS 0.1 11/14/2014   BASOSABS 0.0 11/14/2014   BMET Lab Results  Component Value Date   NA 139 11/14/2014   K 4.6 11/14/2014  CL 100 11/14/2014   CO2 28 11/14/2014   GLUCOSE 88 11/14/2014   BUN 9 11/14/2014   CREATININE 0.93 11/14/2014   CALCIUM 9.1 11/14/2014   GFRNONAA >89 11/14/2014   GFRAA >89 11/14/2014     Assessment and Plan  hiv disease = will switch stribild   Back pain = unclear if all msk, will need to get CT of lumbar spine, he reports that he had plain films that were unrevealing.  CAD with ECM s/p ICD = continue on med management with aspirin, coreg, lisinopril, and rosuvastatin

## 2015-02-24 LAB — URINE CYTOLOGY ANCILLARY ONLY
Chlamydia: NEGATIVE
Neisseria Gonorrhea: NEGATIVE

## 2015-02-24 LAB — HIV-1 RNA QUANT-NO REFLEX-BLD
HIV 1 RNA Quant: <20 copies/mL (ref <20)
HIV-1 RNA Quant, Log: <1.3 {Log} (ref <1.30)

## 2015-02-24 LAB — T-HELPER CELL (CD4) - (RCID CLINIC ONLY)
CD4 % Helper T Cell: 42 % (ref 33–55)
CD4 T CELL ABS: 960 /uL (ref 400–2700)

## 2015-02-24 LAB — RPR

## 2015-02-27 ENCOUNTER — Telehealth: Payer: Self-pay | Admitting: *Deleted

## 2015-02-27 NOTE — Telephone Encounter (Signed)
Called the patient because the doctor wants him to have a CT Lumbar Spine and he has had palin films recently. Advised the patient to try and get a copy of those to take with him to the visit as they were done out of the system and we can not see them. He also needs to get an orange card before we can send him for the CT as well. Patient given information on how to do this and told to call me once he has it so that I can get him a scan asap. He advised he will try to get the films tomorrow 02/28/15 and the [facility] card before Friday 03/03/15.

## 2015-03-08 NOTE — Telephone Encounter (Addendum)
Patient came to the clinic today to give a copy of his Medicaid card so that I can finish the referral for his CT will submit for PA to his insurance today.

## 2015-03-14 ENCOUNTER — Telehealth: Payer: Self-pay | Admitting: Emergency Medicine

## 2015-03-16 NOTE — Telephone Encounter (Signed)
Pharmacy is calling to check on the request of the med refill for Tramadol, please f/u as necessary.

## 2015-03-17 ENCOUNTER — Encounter (HOSPITAL_COMMUNITY): Payer: Self-pay | Admitting: Family Medicine

## 2015-03-17 ENCOUNTER — Telehealth: Payer: Self-pay | Admitting: Emergency Medicine

## 2015-03-17 ENCOUNTER — Emergency Department (HOSPITAL_COMMUNITY)
Admission: EM | Admit: 2015-03-17 | Discharge: 2015-03-17 | Disposition: A | Discharge status: Home / Self Care | Payer: Medicaid Other | Attending: Emergency Medicine | Admitting: Emergency Medicine

## 2015-03-17 DIAGNOSIS — Z7982 Long term (current) use of aspirin: Secondary | ICD-10-CM | POA: Insufficient documentation

## 2015-03-17 DIAGNOSIS — G8929 Other chronic pain: Secondary | ICD-10-CM

## 2015-03-17 DIAGNOSIS — I509 Heart failure, unspecified: Secondary | ICD-10-CM | POA: Diagnosis not present

## 2015-03-17 DIAGNOSIS — E785 Hyperlipidemia, unspecified: Secondary | ICD-10-CM | POA: Insufficient documentation

## 2015-03-17 DIAGNOSIS — Z8719 Personal history of other diseases of the digestive system: Secondary | ICD-10-CM | POA: Insufficient documentation

## 2015-03-17 DIAGNOSIS — Z21 Asymptomatic human immunodeficiency virus [HIV] infection status: Secondary | ICD-10-CM | POA: Insufficient documentation

## 2015-03-17 DIAGNOSIS — Z7951 Long term (current) use of inhaled steroids: Secondary | ICD-10-CM | POA: Diagnosis not present

## 2015-03-17 DIAGNOSIS — G43909 Migraine, unspecified, not intractable, without status migrainosus: Secondary | ICD-10-CM | POA: Insufficient documentation

## 2015-03-17 DIAGNOSIS — Z79899 Other long term (current) drug therapy: Secondary | ICD-10-CM | POA: Insufficient documentation

## 2015-03-17 DIAGNOSIS — M545 Low back pain: Secondary | ICD-10-CM | POA: Insufficient documentation

## 2015-03-17 DIAGNOSIS — Z95810 Presence of automatic (implantable) cardiac defibrillator: Secondary | ICD-10-CM | POA: Insufficient documentation

## 2015-03-17 DIAGNOSIS — I10 Essential (primary) hypertension: Secondary | ICD-10-CM | POA: Diagnosis not present

## 2015-03-17 DIAGNOSIS — Z9861 Coronary angioplasty status: Secondary | ICD-10-CM | POA: Diagnosis not present

## 2015-03-17 DIAGNOSIS — I252 Old myocardial infarction: Secondary | ICD-10-CM | POA: Insufficient documentation

## 2015-03-17 DIAGNOSIS — M549 Dorsalgia, unspecified: Secondary | ICD-10-CM

## 2015-03-17 DIAGNOSIS — I2510 Atherosclerotic heart disease of native coronary artery without angina pectoris: Secondary | ICD-10-CM | POA: Insufficient documentation

## 2015-03-17 NOTE — Discharge Instructions (Signed)

## 2015-03-17 NOTE — ED Notes (Signed)
Pt here for lower back pain. Hx of same.

## 2015-03-17 NOTE — ED Notes (Signed)
Pt here for c/o chronic back pain. Pt was seen today at [facility] clinic. Received Rx Robaxin . Received Rx for Ultram on 3/15.

## 2015-03-17 NOTE — ED Notes (Signed)
Pt walked out after being seen by NP and informed he would not get another Rx.

## 2015-03-17 NOTE — ED Provider Notes (Signed)
CSN: [id number]     Arrival date & time 03/17/15  1354 History  This chart was scribed for non-physician practitioner, Teressa Lower, NP, working with Blane Ohara, MD by Charline Bills, ED Scribe. This patient was seen in room TR07C/TR07C and the patient's care was started at 2:18 PM.   Chief Complaint  Patient presents with  . Back Pain   The history is provided by the patient. No language interpreter was used.   HPI Comments: Edward Haley is a 49 y.o. male, with a h/o HIV, HTN, CAD, hyperlipidemia, CHF, MI, who presents to the Emergency Department complaining of persistent lower back pain for a few years, worsened over the past few days. Pt states that he was seen at [address] and advised to come here if he experienced any other pain. He also states that he went to [facility] orthopedic and scheduled therapy with them. Pt had Mobic and Ultram refilled within the past 3 days, although he states that he does not have any knowledge of this.  Past Medical History  Diagnosis Date  . HIV infection dx'd 1993  . Hypertension   . Coronary artery disease   . Hyperlipidemia   . Automatic implantable cardioverter-defibrillator in situ   . CHF (congestive heart failure)   . MI (myocardial infarction) 2011    x3  . GERD (gastroesophageal reflux disease)   . History of stomach ulcers   . Daily headache   . Migraine     "qd" (04/26/2014)  . Chronic lower back pain    Past Surgical History  Procedure Laterality Date  . Cardiac defibrillator placement  04/26/2014  . Coronary angioplasty with stent placement  03/11/2013    Dr Salomon [facility] [place] - Promus Premiere Monorail 2.5x56mm to RCA & 2.5x63mm to RCA  . Coronary angioplasty with stent placement  02/11/2010    [facility], [place] - Multi-Link 5.0x46mm to LAD  . Tonsillectomy    . Cystectomy  1990's    "2 taken off my bottom" (04/26/2014)  . Implantable cardioverter defibrillator implant  N/A 04/26/2014    Procedure: IMPLANTABLE CARDIOVERTER DEFIBRILLATOR IMPLANT;  Surgeon: Thurmon Fair, MD;  Location: [facility] CATH LAB;  Service: Cardiovascular;  Laterality: N/A;   Family History  Problem Relation Age of Onset  . Stroke Mother   . Hypertension Mother   . Diabetes Mother   . Heart disease Sister   . Cancer Sister    History  Substance Use Topics  . Smoking status: Never Smoker   . Smokeless tobacco: Never Used  . Alcohol Use: No    Review of Systems  Musculoskeletal: Positive for back pain.   Allergies  Shellfish allergy  Home Medications   Prior to Admission medications   Medication Sig Start Date End Date Taking? Authorizing Provider  aspirin EC 325 MG tablet Take 325 mg by mouth daily.    Historical Provider, MD  carvedilol (COREG) 12.5 MG tablet Take 1 tablet (12.5 mg total) by mouth 2 (two) times daily with a meal. 02/07/15   [name], MD  clopidogrel (PLAVIX) 75 MG tablet Take 1 tablet (75 mg total) by mouth daily with breakfast. 02/07/15   [name], MD  cyclobenzaprine (FLEXERIL) 10 MG tablet Take 1 tablet (10 mg total) by mouth 2 (two) times daily as needed for muscle spasms. Patient not taking: Reported on 02/23/2015 10/31/14   [name], PA-C  diphenhydrAMINE (BENADRYL) 25 mg capsule Take 25 mg by mouth 2 (  two) times daily.     Historical Provider, MD  dronabinol (MARINOL) 2.5 MG capsule Take 1 capsule (2.5 mg total) by mouth 2 (two) times daily before a meal. 02/07/15   [name], MD  elvitegravir-cobicistat-emtricitabine-tenofovir (STRIBILD) 150-150-200-300 MG TABS tablet Take 1 tablet by mouth daily with breakfast. 02/23/15   [name], MD  ENSURE PLUS (ENSURE PLUS) LIQD Take 237 mLs by mouth 2 (two) times daily between meals.    Historical Provider, MD  fluticasone (FLONASE) 50 MCG/ACT nasal spray Place 2 sprays into both nostrils daily. 02/07/15   [name], MD  gabapentin (NEURONTIN) 100 MG capsule Take 100 mg by mouth 3 (three)  times daily.    Historical Provider, MD  HYDROcodone-homatropine (HYCODAN) 5-1.5 MG/5ML syrup Take 5 mLs by mouth every 6 (six) hours as needed for cough. Patient not taking: Reported on 02/23/2015 02/18/15   [name], MD  lisinopril (ZESTRIL) 10 MG tablet Take 0.5 tablets (5 mg total) by mouth daily. 02/07/15   [name], MD  Multiple Vitamin (MULTIVITAMIN WITH MINERALS) TABS tablet Take 1 tablet by mouth at bedtime.    Historical Provider, MD  nitroGLYCERIN (NITROSTAT) 0.4 MG SL tablet Place 1 tablet (0.4 mg total) under the tongue every 5 (five) minutes as needed for chest pain. 02/07/15   [name], MD  oseltamivir (TAMIFLU) 75 MG capsule Take 1 capsule (75 mg total) by mouth every 12 (twelve) hours. Patient not taking: Reported on 02/23/2015 02/18/15   [name], MD  predniSONE (DELTASONE) 20 MG tablet Take 2 tablets (40 mg total) by mouth daily. Patient not taking: Reported on 02/23/2015 01/19/15   [name], NP  promethazine (PHENERGAN) 50 MG tablet Take 1 tablet (50 mg total) by mouth every 8 (eight) hours as needed for nausea or vomiting. 02/07/15   [name], MD  rosuvastatin (CRESTOR) 40 MG tablet Take 1 tablet (40 mg total) by mouth at bedtime. 03/15/14   [name], MD  tadalafil (CIALIS) 20 MG tablet Take 1 tablet (20 mg total) by mouth daily as needed for erectile dysfunction. 02/07/15   [name], MD  Vitamin D, Ergocalciferol, (DRISDOL) 50000 UNITS CAPS capsule Take 1 capsule (50,000 Units total) by mouth every 7 (seven) days. 03/15/14   [name], MD   BP 109/81 mmHg  Pulse 74  Temp(Src) 97.9 F (36.6 C) (Oral)  Resp 14  SpO2 98% Physical Exam  Constitutional: He is oriented to person, place, and time. He appears well-developed and well-nourished. No distress.  HENT:  Head: Normocephalic and atraumatic.  Eyes: Conjunctivae and EOM are normal.  Neck: Neck supple. No tracheal deviation present.  Cardiovascular: Normal rate.   Pulmonary/Chest:  Effort normal. No respiratory distress.  Musculoskeletal: Normal range of motion.  Neurological: He is alert and oriented to person, place, and time.  Skin: Skin is warm and dry.  Psychiatric: He has a normal mood and affect. His behavior is normal.  Nursing note and vitals reviewed.  ED Course  Procedures (including critical care time) DIAGNOSTIC STUDIES: Oxygen Saturation is 98% on RA, normal by my interpretation.    COORDINATION OF CARE: 2:23 PM-Discussed treatment plan with pt at bedside and pt agreed to plan.   Labs Review Labs Reviewed - No data to display  Imaging Review No results found.   EKG Interpretation None      MDM   Final diagnoses:  Chronic back pain    Pt [facility]ram [facility] Discussed with pt that medications would not be given. Pt left angry  I personally performed the services described in this documentation, which was scribed in my presence. The recorded information has been reviewed and is accurate.    Teressa Lower, NP 03/17/15 1429  Blane Ohara, MD 03/17/15 1630

## 2015-03-27 ENCOUNTER — Telehealth: Payer: Self-pay | Admitting: *Deleted

## 2015-03-27 NOTE — Telephone Encounter (Signed)
Thanks Michelle

## 2015-03-27 NOTE — Telephone Encounter (Signed)
Patient walked in, asked for help, stating he needed a new medication that would be covered by his insurance.  He states that the cialis is not covered, adn that another drug would be if he had difficulty starting/stopping his stream of urine and if he had urinary incontinence.  RN advised patient that if he was indeed having those issues, he needed to be evaluated by a physician, potentially sent to a urologist for evaluation.  Patient agreed.  He has [employer], and as such, those referrals need to come from the PCP listed on his card. Patient verbalized understanding, agreement. He will go to Dr. Orpah Cobb to address this. Andree Coss, RN

## 2015-03-28 ENCOUNTER — Telehealth: Payer: Self-pay | Admitting: Emergency Medicine

## 2015-03-30 NOTE — Telephone Encounter (Signed)
Called the patient to report that after sending documentation and all the notes requested the insurance did not approve the CT Lumbar Spine ordered by the doctor. Had to leave a message for him to call the office for next step. Will advise the doctor a well and ask what to do next.

## 2015-03-31 ENCOUNTER — Other Ambulatory Visit: Payer: Self-pay

## 2015-03-31 ENCOUNTER — Encounter (HOSPITAL_COMMUNITY): Payer: Self-pay

## 2015-03-31 ENCOUNTER — Emergency Department (HOSPITAL_COMMUNITY)
Admission: EM | Admit: 2015-03-31 | Discharge: 2015-03-31 | Disposition: A | Discharge status: Home / Self Care | Payer: Medicaid Other | Attending: Emergency Medicine | Admitting: Emergency Medicine

## 2015-03-31 ENCOUNTER — Emergency Department (HOSPITAL_COMMUNITY): Payer: Medicaid Other

## 2015-03-31 DIAGNOSIS — Z8719 Personal history of other diseases of the digestive system: Secondary | ICD-10-CM | POA: Insufficient documentation

## 2015-03-31 DIAGNOSIS — Z7982 Long term (current) use of aspirin: Secondary | ICD-10-CM | POA: Diagnosis not present

## 2015-03-31 DIAGNOSIS — Z21 Asymptomatic human immunodeficiency virus [HIV] infection status: Secondary | ICD-10-CM | POA: Diagnosis not present

## 2015-03-31 DIAGNOSIS — Z9861 Coronary angioplasty status: Secondary | ICD-10-CM | POA: Insufficient documentation

## 2015-03-31 DIAGNOSIS — G8929 Other chronic pain: Secondary | ICD-10-CM | POA: Diagnosis not present

## 2015-03-31 DIAGNOSIS — Z95810 Presence of automatic (implantable) cardiac defibrillator: Secondary | ICD-10-CM | POA: Insufficient documentation

## 2015-03-31 DIAGNOSIS — I10 Essential (primary) hypertension: Secondary | ICD-10-CM | POA: Insufficient documentation

## 2015-03-31 DIAGNOSIS — I509 Heart failure, unspecified: Secondary | ICD-10-CM | POA: Insufficient documentation

## 2015-03-31 DIAGNOSIS — Z79899 Other long term (current) drug therapy: Secondary | ICD-10-CM | POA: Insufficient documentation

## 2015-03-31 DIAGNOSIS — G43909 Migraine, unspecified, not intractable, without status migrainosus: Secondary | ICD-10-CM | POA: Insufficient documentation

## 2015-03-31 DIAGNOSIS — I252 Old myocardial infarction: Secondary | ICD-10-CM | POA: Insufficient documentation

## 2015-03-31 DIAGNOSIS — I2510 Atherosclerotic heart disease of native coronary artery without angina pectoris: Secondary | ICD-10-CM | POA: Diagnosis not present

## 2015-03-31 DIAGNOSIS — Z7951 Long term (current) use of inhaled steroids: Secondary | ICD-10-CM | POA: Diagnosis not present

## 2015-03-31 DIAGNOSIS — R202 Paresthesia of skin: Secondary | ICD-10-CM | POA: Insufficient documentation

## 2015-03-31 DIAGNOSIS — Z7952 Long term (current) use of systemic steroids: Secondary | ICD-10-CM | POA: Insufficient documentation

## 2015-03-31 DIAGNOSIS — Z7902 Long term (current) use of antithrombotics/antiplatelets: Secondary | ICD-10-CM | POA: Diagnosis not present

## 2015-03-31 LAB — I-STAT TROPONIN, ED: TROPONIN I, POC: 0 ng/mL (ref 0.00–0.08)

## 2015-03-31 LAB — CBC WITH DIFFERENTIAL/PLATELET
Basophils Absolute: 0 10*3/uL (ref 0.0–0.1)
Basophils Relative: 0 % (ref 0–1)
Eosinophils Absolute: 0.1 10*3/uL (ref 0.0–0.7)
Eosinophils Relative: 2 % (ref 0–5)
HEMATOCRIT: 37 % — AB (ref 39.0–52.0)
HEMOGLOBIN: 13.1 g/dL (ref 13.0–17.0)
Lymphocytes Relative: 54 % — ABNORMAL HIGH (ref 12–46)
Lymphs Abs: 2.7 10*3/uL (ref 0.7–4.0)
MCH: 30.8 pg (ref 26.0–34.0)
MCHC: 35.4 g/dL (ref 30.0–36.0)
MCV: 86.9 fL (ref 78.0–100.0)
MONO ABS: 0.5 10*3/uL (ref 0.1–1.0)
Monocytes Relative: 10 % (ref 3–12)
NEUTROS ABS: 1.7 10*3/uL (ref 1.7–7.7)
NEUTROS PCT: 34 % — AB (ref 43–77)
Platelets: 128 10*3/uL — ABNORMAL LOW (ref 150–400)
RBC: 4.26 MIL/uL (ref 4.22–5.81)
RDW: 14 % (ref 11.5–15.5)
WBC: 5 10*3/uL (ref 4.0–10.5)

## 2015-03-31 LAB — BASIC METABOLIC PANEL
ANION GAP: 4 — AB (ref 5–15)
BUN: 11 mg/dL (ref 6–23)
CALCIUM: 8.8 mg/dL (ref 8.4–10.5)
CHLORIDE: 109 mmol/L (ref 96–112)
CO2: 28 mmol/L (ref 19–32)
CREATININE: 1.03 mg/dL (ref 0.50–1.35)
GFR calc Af Amer: >90 mL/min (ref >90)
GFR calc non Af Amer: 83 mL/min — ABNORMAL LOW (ref >90)
GLUCOSE: 105 mg/dL — AB (ref 70–99)
Potassium: 4.2 mmol/L (ref 3.5–5.1)
Sodium: 141 mmol/L (ref 135–145)

## 2015-03-31 NOTE — Discharge Instructions (Signed)
We saw you in the ER for the tingling sensation to the Left arm. All the results in the ER are normal, labs and imaging. We are not sure what is causing your symptoms. This could be mini stroke, or neuropathy - therefore please see the Neurologist as requested. Return to the Er if the symptoms get worse.   Paresthesia Paresthesia is an abnormal burning or prickling sensation. This sensation is generally felt in the hands, arms, legs, or feet. However, it may occur in any part of the body. It is usually not painful. The feeling may be described as:  Tingling or numbness.  "Pins and needles."  Skin crawling.  Buzzing.  Limbs "falling asleep."  Itching. Most people experience temporary (transient) paresthesia at some time in their lives. CAUSES  Paresthesia may occur when you breathe too quickly (hyperventilation). It can also occur without any apparent cause. Commonly, paresthesia occurs when pressure is placed on a nerve. The feeling quickly goes away once the pressure is removed. For some people, however, paresthesia is a long-lasting (chronic) condition caused by an underlying disorder. The underlying disorder may be:  A traumatic, direct injury to nerves. Examples include a:  Broken (fractured) neck.  Fractured skull.  A disorder affecting the brain and spinal cord (central nervous system). Examples include:  Transverse myelitis.  Encephalitis.  Transient ischemic attack.  Multiple sclerosis.  Stroke.  Tumor or blood vessel problems, such as an arteriovenous malformation pressing against the brain or spinal cord.  A condition that damages the peripheral nerves (peripheral neuropathy). Peripheral nerves are not part of the brain and spinal cord. These conditions include:  Diabetes.  Peripheral vascular disease.  Nerve entrapment syndromes, such as carpal tunnel syndrome.  Shingles.  Hypothyroidism.  Vitamin B12 deficiencies.  Alcoholism.  Heavy metal  poisoning (lead, arsenic).  Rheumatoid arthritis.  Systemic lupus erythematosus. DIAGNOSIS  Your caregiver will attempt to find the underlying cause of your paresthesia. Your caregiver may:  Take your medical history.  Perform a physical exam.  Order various lab tests.  Order imaging tests. TREATMENT  Treatment for paresthesia depends on the underlying cause. HOME CARE INSTRUCTIONS  Avoid drinking alcohol.  You may consider massage or acupuncture to help relieve your symptoms.  Keep all follow-up appointments as directed by your caregiver. SEEK IMMEDIATE MEDICAL CARE IF:   You feel weak.  You have trouble walking or moving.  You have problems with speech or vision.  You feel confused.  You cannot control your bladder or bowel movements.  You feel numbness after an injury.  You faint.  Your burning or prickling feeling gets worse when walking.  You have pain, cramps, or dizziness.  You develop a rash. MAKE SURE YOU:  Understand these instructions.  Will watch your condition.  Will get help right away if you are not doing well or get worse. Document Released: 12/06/2002 Document Revised: 03/09/2012 Document Reviewed: 09/06/2011 [facility] Patient Information 2015 [place], [place]. This information is not intended to replace advice given to you by your health care provider. Make sure you discuss any questions you have with your health care provider.  Peripheral Neuropathy Peripheral neuropathy is a type of nerve damage. It affects nerves that carry signals between the spinal cord and other parts of the body. These are called peripheral nerves. With peripheral neuropathy, one nerve or a group of nerves may be damaged.  CAUSES  Many things can damage peripheral nerves. For some people with peripheral neuropathy, the cause is unknown. Some causes  include:  Diabetes. This is the most common cause of peripheral neuropathy.  Injury to a nerve.  Pressure or stress  on a nerve that lasts a long time.  Too little vitamin B. Alcoholism can lead to this.  Infections.  Autoimmune diseases, such as multiple sclerosis and systemic lupus erythematosus.  Inherited nerve diseases.  Some medicines, such as cancer drugs.  Toxic substances, such as lead and mercury.  Too little blood flowing to the legs.  Kidney disease.  Thyroid disease. SIGNS AND SYMPTOMS  Different people have different symptoms. The symptoms you have will depend on which of your nerves is damaged. Common symptoms include:  Loss of feeling (numbness) in the feet and hands.  Tingling in the feet and hands.  Pain that burns.  Very sensitive skin.  Weakness.  Not being able to move a part of the body (paralysis).  Muscle twitching.  Clumsiness or poor coordination.  Loss of balance.  Not being able to control your bladder.  Feeling dizzy.  Sexual problems. DIAGNOSIS  Peripheral neuropathy is a symptom, not a disease. Finding the cause of peripheral neuropathy can be hard. To figure that out, your health care provider will take a medical history and do a physical exam. A neurological exam will also be done. This involves checking things affected by your brain, spinal cord, and nerves (nervous system). For example, your health care provider will check your reflexes, how you move, and what you can feel.  Other types of tests may also be ordered, such as:  Blood tests.  A test of the fluid in your spinal cord.  Imaging tests, such as CT scans or an MRI.  Electromyography (EMG). This test checks the nerves that control muscles.  Nerve conduction velocity tests. These tests check how fast messages pass through your nerves.  Nerve biopsy. A small piece of nerve is removed. It is then checked under a microscope. TREATMENT   Medicine is often used to treat peripheral neuropathy. Medicines may include:  Pain-relieving medicines. Prescription or over-the-counter  medicine may be suggested.  Antiseizure medicine. This may be used for pain.  Antidepressants. These also may help ease pain from neuropathy.  Lidocaine. This is a numbing medicine. You might wear a patch or be given a shot.  Mexiletine. This medicine is typically used to help control irregular heart rhythms.  Surgery. Surgery may be needed to relieve pressure on a nerve or to destroy a nerve that is causing pain.  Physical therapy to help movement.  Assistive devices to help movement. HOME CARE INSTRUCTIONS   Only take over-the-counter or prescription medicines as directed by your health care provider. Follow the instructions carefully for any given medicines. Do not take any other medicines without first getting approval from your health care provider.  If you have diabetes, work closely with your health care provider to keep your blood sugar under control.  If you have numbness in your feet:  Check every day for signs of injury or infection. Watch for redness, warmth, and swelling.  Wear padded socks and comfortable shoes. These help protect your feet.  Do not do things that put pressure on your damaged nerve.  Do not smoke. Smoking keeps blood from getting to damaged nerves.  Avoid or limit alcohol. Too much alcohol can cause a lack of B vitamins. These vitamins are needed for healthy nerves.  Develop a good support system. Coping with peripheral neuropathy can be stressful. Talk to a mental health specialist or join a  support group if you are struggling.  Follow up with your health care provider as directed. SEEK MEDICAL CARE IF:   You have new signs or symptoms of peripheral neuropathy.  You are struggling emotionally from dealing with peripheral neuropathy.  You have a fever. SEEK IMMEDIATE MEDICAL CARE IF:   You have an injury or infection that is not healing.  You feel very dizzy or begin vomiting.  You have chest pain.  You have trouble  breathing. Document Released: 12/06/2002 Document Revised: 08/28/2011 Document Reviewed: 08/23/2013 [facility] Patient Information 2015 [place], [place]. This information is not intended to replace advice given to you by your health care provider. Make sure you discuss any questions you have with your health care provider. Transient Ischemic Attack A transient ischemic attack (TIA) is a "warning stroke" that causes stroke-like symptoms. Unlike a stroke, a TIA does not cause permanent damage to the brain. The symptoms of a TIA can happen very fast and do not last long. It is important to know the symptoms of a TIA and what to do. This can help prevent a major stroke or death. CAUSES   A TIA is caused by a temporary blockage in an artery in the brain or neck (carotid artery). The blockage does not allow the brain to get the blood supply it needs and can cause different symptoms. The blockage can be caused by either:  A blood clot.  Fatty buildup (plaque) in a neck or brain artery. RISK FACTORS  High blood pressure (hypertension).  High cholesterol.  Diabetes mellitus.  Heart disease.  The build up of plaque in the blood vessels (peripheral artery disease or atherosclerosis).  The build up of plaque in the blood vessels providing blood and oxygen to the brain (carotid artery stenosis).  An abnormal heart rhythm (atrial fibrillation).  Obesity.  Smoking.  Taking oral contraceptives (especially in combination with smoking).  Physical inactivity.  A diet high in fats, salt (sodium), and calories.  Alcohol use.  Use of illegal drugs (especially cocaine and methamphetamine).  Being male.  Being African American.  Being over the age of 66.  Family history of stroke.  Previous history of blood clots, stroke, TIA, or heart attack.  Sickle cell disease. SYMPTOMS  TIA symptoms are the same as a stroke but are temporary. These symptoms usually develop suddenly, or may be newly  present upon awakening from sleep:  Sudden weakness or numbness of the face, arm, or leg, especially on one side of the body.  Sudden trouble walking or difficulty moving arms or legs.  Sudden confusion.  Sudden personality changes.  Trouble speaking (aphasia) or understanding.  Difficulty swallowing.  Sudden trouble seeing in one or both eyes.  Double vision.  Dizziness.  Loss of balance or coordination.  Sudden severe headache with no known cause.  Trouble reading or writing.  Loss of bowel or bladder control.  Loss of consciousness. DIAGNOSIS  Your caregiver may be able to determine the presence or absence of a TIA based on your symptoms, history, and physical exam. Computed tomography (CT scan) of the brain is usually performed to help identify a TIA. Other tests may be done to diagnose a TIA. These tests may include:  Electrocardiography.  Continuous heart monitoring.  Echocardiography.  Carotid ultrasonography.  Magnetic resonance imaging (MRI).  A scan of the brain circulation.  Blood tests. PREVENTION  The risk of a TIA can be decreased by appropriately treating high blood pressure, high cholesterol, diabetes, heart disease, and obesity  and by quitting smoking, limiting alcohol, and staying physically active. TREATMENT  Time is of the essence. Since the symptoms of TIA are the same as a stroke, it is important to seek treatment as soon as possible because you may need a medicine to dissolve the clot (thrombolytic) that cannot be given if too much time has passed. Treatment options vary. Treatment options may include rest, oxygen, intravenous (IV) fluids, and medicines to thin the blood (anticoagulants). Medicines and diet may be used to address diabetes, high blood pressure, and other risk factors. Measures will be taken to prevent short-term and long-term complications, including infection from breathing foreign material into the lungs (aspiration pneumonia),  blood clots in the legs, and falls. Treatment options include procedures to either remove plaque in the carotid arteries or dilate carotid arteries that have narrowed due to plaque. Those procedures are:  Carotid endarterectomy.  Carotid angioplasty and stenting. HOME CARE INSTRUCTIONS   Take all medicines prescribed by your caregiver. Follow the directions carefully. Medicines may be used to control risk factors for a stroke. Be sure you understand all your medicine instructions.  You may be told to take aspirin or the anticoagulant warfarin. Warfarin needs to be taken exactly as instructed.  Taking too much or too little warfarin is dangerous. Too much warfarin increases the risk of bleeding. Too little warfarin continues to allow the risk for blood clots. While taking warfarin, you will need to have regular blood tests to measure your blood clotting time. A PT blood test measures how long it takes for blood to clot. Your PT is used to calculate another value called an INR. Your PT and INR help your caregiver to adjust your dose of warfarin. The dose can change for many reasons. It is critically important that you take warfarin exactly as prescribed.  Many foods, especially foods high in vitamin K can interfere with warfarin and affect the PT and INR. Foods high in vitamin K include spinach, kale, broccoli, cabbage, collard and turnip greens, brussels sprouts, peas, cauliflower, seaweed, and parsley as well as beef and pork liver, green tea, and soybean oil. You should eat a consistent amount of foods high in vitamin K. Avoid major changes in your diet, or notify your caregiver before changing your diet. Arrange a visit with a dietitian to answer your questions.  Many medicines can interfere with warfarin and affect the PT and INR. You must tell your caregiver about any and all medicines you take, this includes all vitamins and supplements. Be especially cautious with aspirin and anti-inflammatory  medicines. Do not take or discontinue any prescribed or over-the-counter medicine except on the advice of your caregiver or pharmacist.  Warfarin can have side effects, such as excessive bruising or bleeding. You will need to hold pressure over cuts for longer than usual. Your caregiver or pharmacist will discuss other potential side effects.  Avoid sports or activities that may cause injury or bleeding.  Be mindful when shaving, flossing your teeth, or handling sharp objects.  Alcohol can change the body's ability to handle warfarin. It is best to avoid alcoholic drinks or consume only very small amounts while taking warfarin. Notify your caregiver if you change your alcohol intake.  Notify your dentist or other caregivers before procedures.  Eat a diet that includes 5 or more servings of fruits and vegetables each day. This may reduce the risk of stroke. Certain diets may be prescribed to address high blood pressure, high cholesterol, diabetes, or obesity.  A  low-sodium, low-saturated fat, low-trans fat, low-cholesterol diet is recommended to manage high blood pressure.  A low-saturated fat, low-trans fat, low-cholesterol, and high-fiber diet may control cholesterol levels.  A controlled-carbohydrate, controlled-sugar diet is recommended to manage diabetes.  A reduced-calorie, low-sodium, low-saturated fat, low-trans fat, low-cholesterol diet is recommended to manage obesity.  Maintain a healthy weight.  Stay physically active. It is recommended that you get at least 30 minutes of activity on most or all days.  Do not smoke.  Limit alcohol use even if you are not taking warfarin. Moderate alcohol use is considered to be:  No more than 2 drinks each day for men.  No more than 1 drink each day for nonpregnant women.  Stop drug abuse.  Home safety. A safe home environment is important to reduce the risk of falls. Your caregiver may arrange for specialists to evaluate your home.  Having grab bars in the bedroom and bathroom is often important. Your caregiver may arrange for equipment to be used at home, such as raised toilets and a seat for the shower.  Follow all instructions for follow-up with your caregiver. This is very important. This includes any referrals and lab tests. Proper follow up can prevent a stroke or another TIA from occurring. SEEK MEDICAL CARE IF:  You have personality changes.  You have difficulty swallowing.  You are seeing double.  You have dizziness.  You have a fever.  You have skin breakdown. SEEK IMMEDIATE MEDICAL CARE IF:  Any of these symptoms may represent a serious problem that is an emergency. Do not wait to see if the symptoms will go away. Get medical help right away. Call your local emergency services (911 in [place] Do not drive yourself to the hospital.  You have sudden weakness or numbness of the face, arm, or leg, especially on one side of the body.  You have sudden trouble walking or difficulty moving arms or legs.  You have sudden confusion.  You have trouble speaking (aphasia) or understanding.  You have sudden trouble seeing in one or both eyes.  You have a loss of balance or coordination.  You have a sudden, severe headache with no known cause.  You have new chest pain or an irregular heartbeat.  You have a partial or total loss of consciousness. MAKE SURE YOU:   Understand these instructions.  Will watch your condition.  Will get help right away if you are not doing well or get worse. Document Released: 09/25/2005 Document Revised: 12/21/2013 Document Reviewed: 03/23/2014 [facility] Patient Information 2015 [place], [place]. This information is not intended to replace advice given to you by your health care provider. Make sure you discuss any questions you have with your health care provider.

## 2015-03-31 NOTE — ED Provider Notes (Signed)
CSN: [id number]     Arrival date & time 03/31/15  0036 History  This chart was scribed for Derwood Kaplan, MD by Tanda Rockers, ED Scribe. This patient was seen in room A07C/A07C and the patient's care was started at 12:56 AM.     Chief Complaint  Patient presents with  . Chest Pain   The history is provided by the patient. No language interpreter was used.     HPI Comments: FINIS HOWARD is a 49 y.o. male brought in by ambulance, with PMHx HIV, CAD, HTN, HLD, AICD, CHF, MI who presents to the Emergency Department complaining of left sided upper extremity and bilateral lower extremity parethesia that began earlier tonight around 11 PM (approximately 2 hours ago). Pt reports that he took a NTG with no relief, causing him to call EMS. Pt states that the tingling in his legs is worse than his usual neuropathy symptoms. He also complains of a throbbing sensation. Pt cannot say if he had upper extremity weakness because he did not pick anything up. Pt denies chest pain, new shortness of breath, abdominal pain, numbness, weakness. Pt denies Hx stroke. Pt does admit to recreational marijuana use but denies smoking cigarettes.   Past Medical History  Diagnosis Date  . HIV infection dx'd 1993  . Hypertension   . Coronary artery disease   . Hyperlipidemia   . Automatic implantable cardioverter-defibrillator in situ   . CHF (congestive heart failure)   . MI (myocardial infarction) 2011    x3  . GERD (gastroesophageal reflux disease)   . History of stomach ulcers   . Daily headache   . Migraine     "qd" (04/26/2014)  . Chronic lower back pain    Past Surgical History  Procedure Laterality Date  . Cardiac defibrillator placement  04/26/2014  . Coronary angioplasty with stent placement  03/11/2013    Dr Salomon Fick [facility] [place] - Promus Premiere Monorail 2.5x60mm to RCA & 2.5x73mm to RCA  . Coronary angioplasty with stent placement  02/11/2010    [facility], [place] - Multi-Link 5.0x39mm to LAD  . Tonsillectomy    . Cystectomy  1990's    "2 taken off my bottom" (04/26/2014)  . Implantable cardioverter defibrillator implant N/A 04/26/2014    Procedure: IMPLANTABLE CARDIOVERTER DEFIBRILLATOR IMPLANT;  Surgeon: Thurmon Fair, MD;  Location: [facility] CATH LAB;  Service: Cardiovascular;  Laterality: N/A;   Family History  Problem Relation Age of Onset  . Stroke Mother   . Hypertension Mother   . Diabetes Mother   . Heart disease Sister   . Cancer Sister    History  Substance Use Topics  . Smoking status: Never Smoker   . Smokeless tobacco: Never Used  . Alcohol Use: No    Review of Systems  Respiratory: Negative for cough and shortness of breath.   Cardiovascular: Negative for chest pain and leg swelling.  Musculoskeletal: Positive for arthralgias (Throbbing sensation to left arm. ).  Neurological: Negative for dizziness, tremors, syncope, facial asymmetry, speech difficulty, weakness, numbness and headaches.       Paresthesia to LUE and bilateral LE.   All other systems reviewed and are negative.     Allergies  Shellfish allergy  Home Medications   Prior to Admission medications   Medication Sig Start Date End Date Taking? Authorizing Provider  aspirin EC 325 MG tablet Take 325 mg by mouth daily.    Historical Provider, MD  carvedilol (COREG) 12.5 MG  tablet Take 1 tablet (12.5 mg total) by mouth 2 (two) times daily with a meal. 02/07/15   [name], MD  clopidogrel (PLAVIX) 75 MG tablet Take 1 tablet (75 mg total) by mouth daily with breakfast. 02/07/15   [name], MD  cyclobenzaprine (FLEXERIL) 10 MG tablet Take 1 tablet (10 mg total) by mouth 2 (two) times daily as needed for muscle spasms. Patient not taking: Reported on 02/23/2015 10/31/14   [name], PA-C  diphenhydrAMINE (BENADRYL) 25 mg capsule Take 25 mg by mouth 2 (two) times daily.     Historical Provider, MD  dronabinol (MARINOL) 2.5 MG capsule Take 1  capsule (2.5 mg total) by mouth 2 (two) times daily before a meal. 02/07/15   [name], MD  elvitegravir-cobicistat-emtricitabine-tenofovir (STRIBILD) 150-150-200-300 MG TABS tablet Take 1 tablet by mouth daily with breakfast. 02/23/15   [name], MD  ENSURE PLUS (ENSURE PLUS) LIQD Take 237 mLs by mouth 2 (two) times daily between meals.    Historical Provider, MD  fluticasone (FLONASE) 50 MCG/ACT nasal spray Place 2 sprays into both nostrils daily. 02/07/15   [name], MD  gabapentin (NEURONTIN) 100 MG capsule Take 100 mg by mouth 3 (three) times daily.    Historical Provider, MD  HYDROcodone-homatropine (HYCODAN) 5-1.5 MG/5ML syrup Take 5 mLs by mouth every 6 (six) hours as needed for cough. Patient not taking: Reported on 02/23/2015 02/18/15   [name], MD  lisinopril (ZESTRIL) 10 MG tablet Take 0.5 tablets (5 mg total) by mouth daily. 02/07/15   [name], MD  Multiple Vitamin (MULTIVITAMIN WITH MINERALS) TABS tablet Take 1 tablet by mouth at bedtime.    Historical Provider, MD  nitroGLYCERIN (NITROSTAT) 0.4 MG SL tablet Place 1 tablet (0.4 mg total) under the tongue every 5 (five) minutes as needed for chest pain. 02/07/15   [name], MD  oseltamivir (TAMIFLU) 75 MG capsule Take 1 capsule (75 mg total) by mouth every 12 (twelve) hours. Patient not taking: Reported on 02/23/2015 02/18/15   [name], MD  predniSONE (DELTASONE) 20 MG tablet Take 2 tablets (40 mg total) by mouth daily. Patient not taking: Reported on 02/23/2015 01/19/15   [name], NP  promethazine (PHENERGAN) 50 MG tablet Take 1 tablet (50 mg total) by mouth every 8 (eight) hours as needed for nausea or vomiting. 02/07/15   [name], MD  rosuvastatin (CRESTOR) 40 MG tablet Take 1 tablet (40 mg total) by mouth at bedtime. 03/15/14   [name], MD  tadalafil (CIALIS) 20 MG tablet Take 1 tablet (20 mg total) by mouth daily as needed for erectile dysfunction. 02/07/15   [name], MD  Vitamin  D, Ergocalciferol, (DRISDOL) 50000 UNITS CAPS capsule Take 1 capsule (50,000 Units total) by mouth every 7 (seven) days. 03/15/14   [name], MD   Triage Vitals: BP 101/68 mmHg  Pulse 71  Resp 20  SpO2 97%   Physical Exam  Constitutional: He is oriented to person, place, and time. He appears well-developed and well-nourished.  Non-toxic appearance. No distress.  HENT:  Head: Normocephalic and atraumatic.  Eyes: Conjunctivae, EOM and lids are normal. Pupils are equal, round, and reactive to light.  Neck: Normal range of motion. Neck supple. No tracheal deviation present. No thyroid mass present.  No midline c-spine tenderness, pt able to turn head to 45 degrees bilaterally without any pain and able to flex neck to the chest and extend without any pain or neurologic symptoms.   Cardiovascular:  Normal rate, regular rhythm and normal heart sounds.  Exam reveals no gallop.   No murmur heard. Pulmonary/Chest: Effort normal and breath sounds normal. No stridor. No respiratory distress. He has no decreased breath sounds. He has no wheezes. He has no rhonchi. He has no rales.  Anterior exam of lungs clear.   Abdominal: Soft. Normal appearance and bowel sounds are normal. He exhibits no distension. There is no tenderness. There is no rebound and no CVA tenderness.  Musculoskeletal: Normal range of motion. He exhibits no edema or tenderness.  Neurological: He is alert and oriented to person, place, and time. He has normal strength. No cranial nerve deficit or sensory deficit. GCS eye subscore is 4. GCS verbal subscore is 5. GCS motor subscore is 6.  CN 2-12 intact. Motor UE 4+/5 bilaterally. Negative pronator drift. Motor LE bilateral  4+/5. Gross sensory exam BLE appears to be baseline for patient. UE sensory exam able to discriminate between sharp and dull bilaterally. Sensation appears equal bilaterally to light touch and pinprick. Pt has subjective paresthesias. Normal grip strength. Normal  cerebellar exam  Skin: Skin is warm and dry. No abrasion and no rash noted.  Psychiatric: He has a normal mood and affect. His speech is normal and behavior is normal.  Nursing note and vitals reviewed.   ED Course  Procedures (including critical care time)  DIAGNOSTIC STUDIES: Oxygen Saturation is 97% on RA, normal by my interpretation.    COORDINATION OF CARE: 1:06 AM-Discussed treatment plan with pt at bedside and pt agreed to plan.   2:55 AM - Nurse informed me that MRI is not possible due to pt's AICD being incompatible for MRIs.   Labs Review Labs Reviewed  CBC WITH DIFFERENTIAL/PLATELET - Abnormal; Notable for the following:    HCT 37.0 (*)    Platelets 128 (*)    Neutrophils Relative % 34 (*)    Lymphocytes Relative 54 (*)    All other components within normal limits  BASIC METABOLIC PANEL - Abnormal; Notable for the following:    Glucose, Bld 105 (*)    GFR calc non Af Amer 83 (*)    Anion gap 4 (*)    All other components within normal limits  I-STAT TROPOININ, ED    Imaging Review Ct Head Wo Contrast  03/31/2015   CLINICAL DATA:  LEFT arm numbness and tingling beginning at 7 p.m.  EXAM: CT HEAD WITHOUT CONTRAST  TECHNIQUE: Contiguous axial images were obtained from the base of the skull through the vertex without intravenous contrast.  COMPARISON:  None.  FINDINGS: The ventricles and sulci are normal. No intraparenchymal hemorrhage, mass effect nor midline shift. No acute large vascular territory infarcts.  No abnormal extra-axial fluid collections. Basal cisterns are patent. Trace calcific atherosclerosis of RIGHT vertebral artery.  No skull fracture. The included ocular globes and orbital contents are non-suspicious. Minimal RIGHT posterior ethmoid mucosal thickening without paranasal sinus air-fluid levels. The mastoid air cells are well aerated.  IMPRESSION: No acute intracranial process ; normal noncontrast CT of the head. If clinical concern acute ischemia, MRI of  the brain including diffusion weighted sequences would be more sensitive.   Electronically Signed   By: Awilda Metro   On: 03/31/2015 04:02     EKG Interpretation   Date/Time:  Friday March 31 2015 00:43:55 EDT Ventricular Rate:  62 PR Interval:  184 QRS Duration: 95 QT Interval:  449 QTC Calculation: 456 R Axis:   -21 Text Interpretation:  Sinus rhythm  Probable left atrial enlargement Left  ventricular hypertrophy Probable anterior infarct, age indeterminate  Abnormal T, consider ischemia, diffuse leads No significant change since  last tracing Confirmed by Rhunette Croft, MD, Janey Genta [phone]) on 03/31/2015 12:51:04  AM       am - spoke with Neurology about the patient, as he is not a candidate for MRI. Went to reassess the patient after the discussion, and his symptoms have resolved. ? TIA vs radicular pain. Will d/c with outpatient neuro f/u, and pt is comfortable with the plan  MDM   Final diagnoses:  Paresthesia of left arm   I personally performed the services described in this documentation, which was scribed in my presence. The recorded information has been reviewed and is accurate.  Pt comes in with cc of paresthesia to the LUE. DDX: Stroke vs. Radicular pain vs. Neuropathy.  Stroke workup ordered, and is neg. Pt is not a candidate for MRI. Cspine is non tender, and doesn't appear to be radicular pain.   Derwood Kaplan, MD 03/31/15 [phone]
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
## 2015-05-03 ENCOUNTER — Ambulatory Visit: Payer: Medicaid Other | Admitting: Internal Medicine

## 2015-05-03 ENCOUNTER — Other Ambulatory Visit: Payer: Self-pay

## 2015-05-03 DIAGNOSIS — R0981 Nasal congestion: Secondary | ICD-10-CM

## 2015-05-03 MED ORDER — FLUTICASONE PROPIONATE 50 MCG/ACT NA SUSP
2.0000 | Freq: Every day | NASAL | Status: AC
Start: 2015-05-03 — End: ?

## 2015-05-18 ENCOUNTER — Ambulatory Visit: Payer: Medicaid Other | Admitting: Internal Medicine

## 2015-05-30 ENCOUNTER — Encounter: Payer: Medicaid Other | Admitting: Cardiovascular Disease

## 2015-06-22 ENCOUNTER — Ambulatory Visit: Payer: Medicaid Other | Admitting: Internal Medicine

## 2015-06-25 ENCOUNTER — Emergency Department: Payer: Self-pay

## 2015-06-25 ENCOUNTER — Emergency Department
Admission: EM | Admit: 2015-06-25 | Discharge: 2015-06-25 | Disposition: A | Discharge status: Home / Self Care | Payer: Self-pay | Attending: Emergency Medical Services | Admitting: Emergency Medical Services

## 2015-06-25 DIAGNOSIS — J019 Acute sinusitis, unspecified: Secondary | ICD-10-CM | POA: Insufficient documentation

## 2015-06-25 DIAGNOSIS — J209 Acute bronchitis, unspecified: Secondary | ICD-10-CM | POA: Insufficient documentation

## 2015-06-25 DIAGNOSIS — R197 Diarrhea, unspecified: Secondary | ICD-10-CM | POA: Insufficient documentation

## 2015-06-25 DIAGNOSIS — Z87891 Personal history of nicotine dependence: Secondary | ICD-10-CM | POA: Insufficient documentation

## 2015-06-25 LAB — URINALYSIS, REFLEX TO MICROSCOPIC EXAM IF INDICATED
Bilirubin, UA: NEGATIVE
Blood, UA: NEGATIVE
Glucose, UA: NEGATIVE
Ketones UA: NEGATIVE
Leukocyte Esterase, UA: NEGATIVE
Nitrite, UA: NEGATIVE
Protein, UR: NEGATIVE
Specific Gravity UA: 1.013 (ref 1.001–1.035)
Urine pH: 6 (ref 5.0–8.0)
Urobilinogen, UA: NORMAL mg/dL

## 2015-06-25 LAB — CBC AND DIFFERENTIAL
Basophils Absolute Automated: 0 10*3/uL (ref 0.00–0.20)
Basophils Automated: 0 %
Eosinophils Absolute Automated: 0.18 10*3/uL (ref 0.00–0.70)
Eosinophils Automated: 2 %
Hematocrit: 42.4 % (ref 42.0–52.0)
Hgb: 14.5 g/dL (ref 13.0–17.0)
Immature Granulocytes Absolute: 0.01 10*3/uL
Immature Granulocytes: 0 %
Lymphocytes Absolute Automated: 1.59 10*3/uL (ref 0.50–4.40)
Lymphocytes Automated: 22 %
MCH: 30.9 pg (ref 28.0–32.0)
MCHC: 34.2 g/dL (ref 32.0–36.0)
MCV: 90.4 fL (ref 80.0–100.0)
MPV: 10.4 fL (ref 9.4–12.3)
Monocytes Absolute Automated: 0.82 10*3/uL (ref 0.00–1.20)
Monocytes: 11 %
Neutrophils Absolute: 4.65 10*3/uL (ref 1.80–8.10)
Neutrophils: 64 %
Nucleated RBC: 0 /100 WBC (ref 0–1)
Platelets: 232 10*3/uL (ref 140–400)
RBC: 4.69 10*6/uL — ABNORMAL LOW (ref 4.70–6.00)
RDW: 12 % (ref 12–15)
WBC: 7.24 10*3/uL (ref 3.50–10.80)

## 2015-06-25 LAB — BASIC METABOLIC PANEL
Anion Gap: 7 (ref 5.0–15.0)
BUN: 10 mg/dL (ref 9–28)
CO2: 23 mEq/L (ref 22–29)
Calcium: 8.9 mg/dL (ref 8.5–10.5)
Chloride: 106 mEq/L (ref 100–111)
Creatinine: 1 mg/dL (ref 0.7–1.3)
Glucose: 99 mg/dL (ref 70–100)
Potassium: 4.6 mEq/L (ref 3.5–5.1)
Sodium: 136 mEq/L (ref 136–145)

## 2015-06-25 LAB — GFR: EGFR: >60

## 2015-06-25 MED ORDER — SODIUM CHLORIDE 0.9 % IV BOLUS
1000.0000 mL | Freq: Once | INTRAVENOUS | Status: AC
Start: 2015-06-25 — End: 2015-06-25
  Administered 2015-06-25: 1000 mL via INTRAVENOUS

## 2015-06-25 MED ORDER — AZITHROMYCIN 250 MG PO TABS
500.0000 mg | ORAL_TABLET | Freq: Once | ORAL | Status: AC
Start: 2015-06-25 — End: 2015-06-25
  Administered 2015-06-25: 500 mg via ORAL
  Filled 2015-06-25: qty 2

## 2015-06-25 MED ORDER — AZITHROMYCIN 250 MG PO TABS
250.0000 mg | ORAL_TABLET | Freq: Every day | ORAL | Status: AC
Start: 2015-06-25 — End: 2015-06-29

## 2015-06-25 NOTE — ED Notes (Signed)
Pt to the ed with fevers, cough, chills and diarrhea since Friday. Alert male in triage, high temp at home 98.8. Alert male in triage

## 2015-06-25 NOTE — Discharge Instructions (Signed)
Dear  Bill Kaiser:    I appreciate your choosing the [facility] for your healthcare needs, and hope your visit today was EXCELLENT.    Instructions:  Please follow-up with Dr. Skipper Cliche    Return to the Emergency Department for any worsening symptoms or concerns.    Below is some information that our patients often find helpful.    We wish you good health and please do not hesitate to contact us if we can ever be of any assistance.    Sincerely,  Deno Etienne, PA-C  [place]    ________________________________________________________________    If you do not continue to improve or your condition worsens, please contact your doctor or return immediately to the Emergency Department.    Thank you for choosing [facility] for your emergency care needs.  We strive to provide EXCELLENT care to you and your family.      DOCTOR REFERRALS  Call [phone] if you need any further referrals and we can help you find a primary care doctor or specialist.  Also, available online at:  [email or web address]    YOUR CONTACT INFORMATION  Before leaving please check with registration to make sure we have an up-to-date contact number.  You can call registration at [phone] to update your information.  For questions about your hospital bill, please call [phone].  For questions about your Emergency Dept Physician bill please call [phone].      FREE HEALTH SERVICES  If you need help with health or social services, please call 2-1-1 for a free referral to resources in your area.  2-1-1 is a free service connecting people with information on health insurance, free clinics, pregnancy, mental health, dental care, food assistance, housing, and substance abuse counseling.  Also, available online at:  http://www.[facility].org    MEDICAL RECORDS AND TESTS  Certain laboratory test results do not come back the same  day, for example urine cultures.   We will contact you if other important findings are noted.  Radiology films are often reviewed again to ensure accuracy.  If there is any discrepancy, we will notify you.      Please call [phone] to pick up a complimentary CD of any radiology studies performed.  If you or your doctor would like to request a copy of your medical records, please call [phone].      ORTHOPEDIC INJURY   Please know that significant injuries can exist even when an initial x-ray is read as normal or negative.  This can occur because some fractures (broken bones) are not initially visible on x-rays.  For this reason, close outpatient follow-up with your primary care doctor or bone specialist (orthopedist) is required.    MEDICATIONS AND FOLLOWUP  Please be aware that some prescription medications can cause drowsiness.  Use caution when driving or operating machinery.    The examination and treatment you have received in our Emergency Department is provided on an emergency basis, and is not intended to be a substitute for your primary care physician.  It is important that your doctor checks you again and that you report any new or remaining problems at that time.      24 HOUR PHARMACIES  [facility] - [address] [place], [place] [address] (1.4 miles, 7 minutes)  [facility] - [address], [place], [place] [address] (6.5 miles, 13 minutes)  Handout with directions available on request

## 2015-06-25 NOTE — Special Discharge Instructions (Cosign Needed)
Follow-up in the clinic provided
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
## 2015-07-06 ENCOUNTER — Telehealth: Payer: Self-pay | Admitting: *Deleted

## 2015-07-06 NOTE — Telephone Encounter (Signed)
ROI received, Pt now living in [place], [place].  Preliminary records sent.

## 2015-08-13 DIAGNOSIS — I429 Cardiomyopathy, unspecified: Secondary | ICD-10-CM

## 2015-08-14 ENCOUNTER — Ambulatory Visit (INDEPENDENT_AMBULATORY_CARE_PROVIDER_SITE_OTHER): Payer: Medicaid Other | Admitting: *Deleted

## 2015-08-14 DIAGNOSIS — I428 Other cardiomyopathies: Secondary | ICD-10-CM

## 2015-08-17 NOTE — Progress Notes (Signed)
Remote ICD transmission.   

## 2015-08-24 LAB — CUP PACEART REMOTE DEVICE CHECK
Battery Remaining Longevity: 130 mo
Battery Voltage: 3.02 V
Date Time Interrogation Session: 20160814041051
HighPow Impedance: 61 Ohm
Lead Channel Impedance Value: 475 Ohm
Lead Channel Pacing Threshold Pulse Width: 0.4 ms
Lead Channel Sensing Intrinsic Amplitude: 28.625 mV
Lead Channel Sensing Intrinsic Amplitude: 28.625 mV
Lead Channel Setting Pacing Amplitude: 2.5 V
Lead Channel Setting Pacing Pulse Width: 0.4 ms
Lead Channel Setting Sensing Sensitivity: 0.3 mV
MDC IDC MSMT LEADCHNL RV IMPEDANCE VALUE: 361 Ohm
MDC IDC MSMT LEADCHNL RV PACING THRESHOLD AMPLITUDE: 0.5 V
MDC IDC SET ZONE DETECTION INTERVAL: 450 ms
MDC IDC STAT BRADY RV PERCENT PACED: 0 %
Zone Setting Detection Interval: 300 ms
Zone Setting Detection Interval: 360 ms

## 2015-09-11 ENCOUNTER — Encounter: Payer: Self-pay | Admitting: Cardiology

## 2015-09-22 ENCOUNTER — Encounter: Payer: Self-pay | Admitting: Cardiovascular Disease

## 2015-12-14 IMAGING — CT CT HEAD W/O CM
1 series · 16 of 30 positions shown, 20 images · non-contrast
Comparison: None.

CLINICAL DATA: LEFT arm numbness and tingling beginning at 7 p.m.

EXAM:
CT HEAD WITHOUT CONTRAST
TECHNIQUE: Contiguous axial images were obtained from the base of the skull
through the vertex without intravenous contrast.

[Series 2: head 5.0 h30s · axial · 0.48mm/px · z∈[-65,+70]mm · 16 of 31 slices shown, 20 images]
[im 2/31  brain]
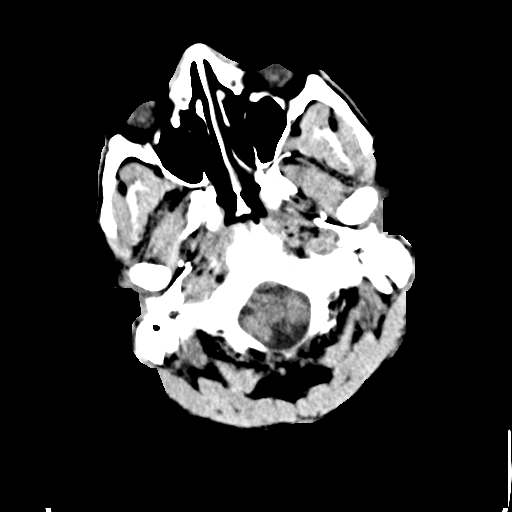
[im 2/31  bone]
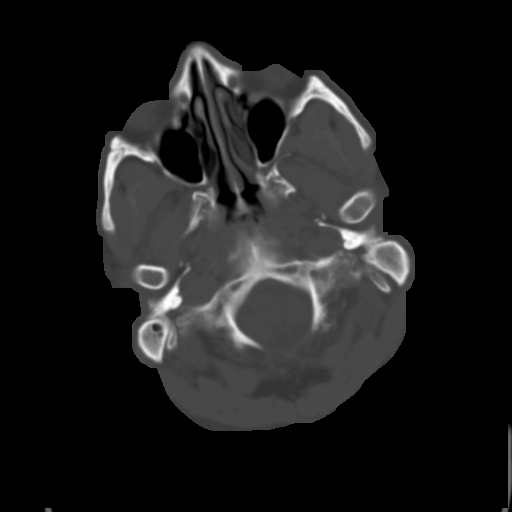
[im 4/31  brain]
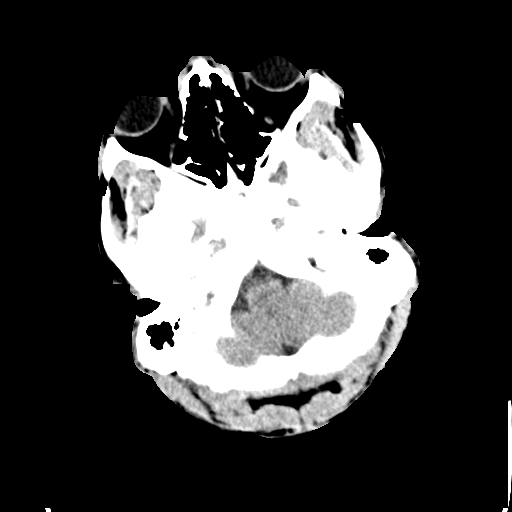
[im 6/31  brain]
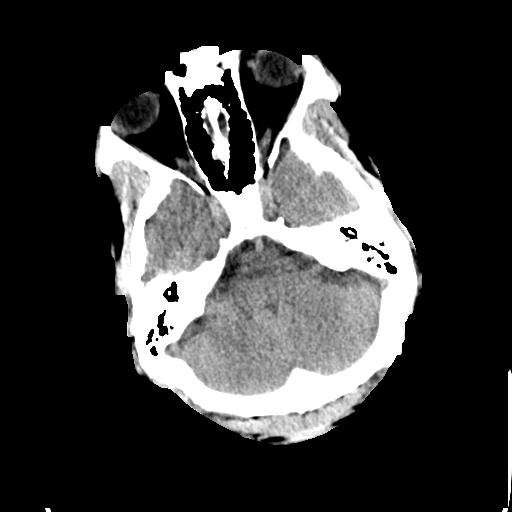
[im 8/31  brain]
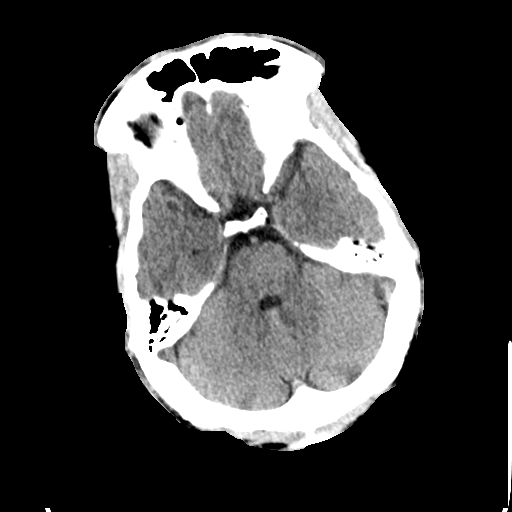
[im 9/31  brain]
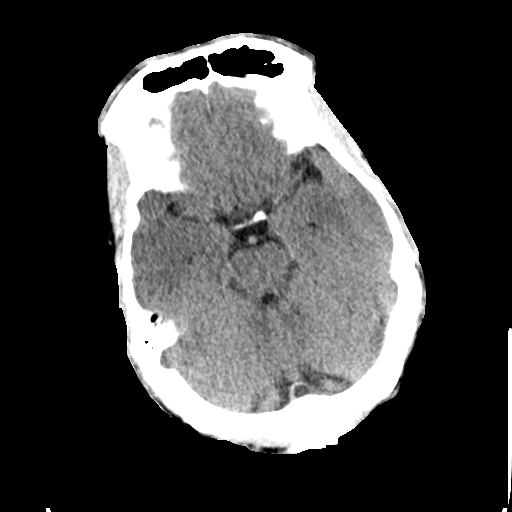
[im 9/31  bone]
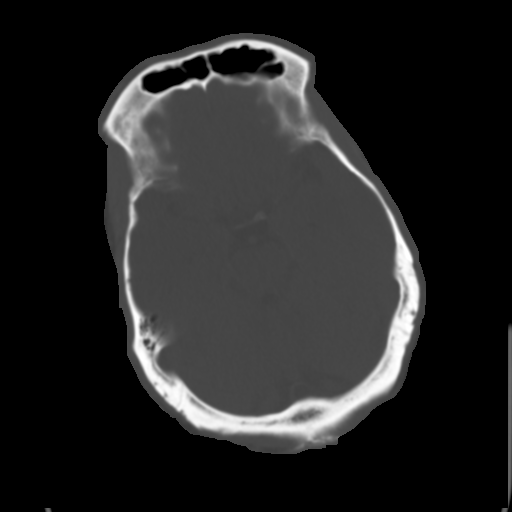
[im 11/31  brain]
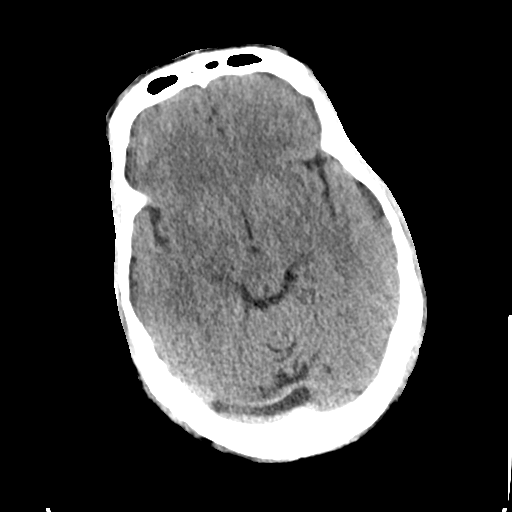
[im 13/31  brain]
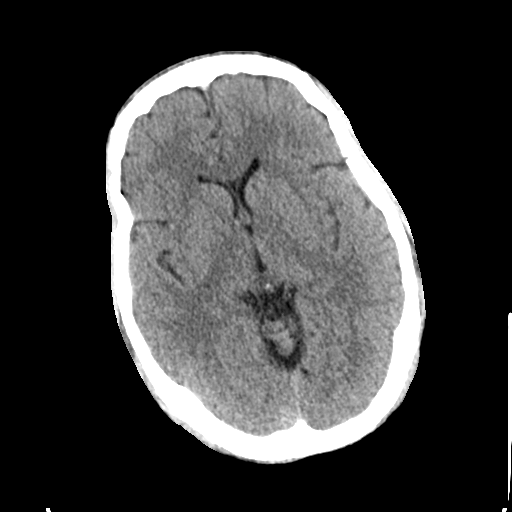
[im 15/31  brain]
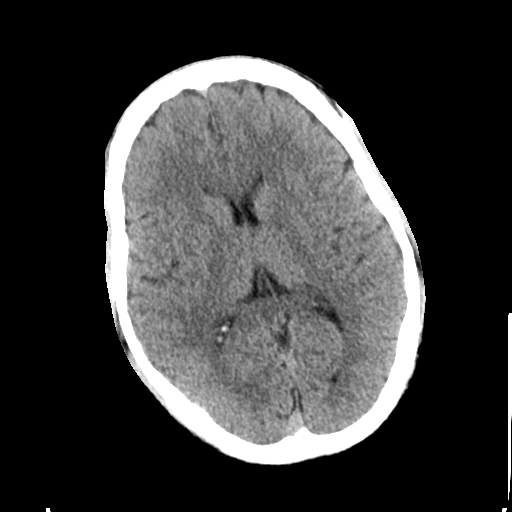
[im 16/31  brain]
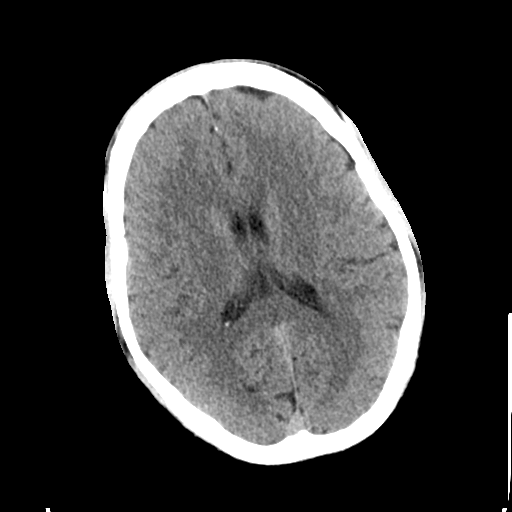
[im 16/31  bone]
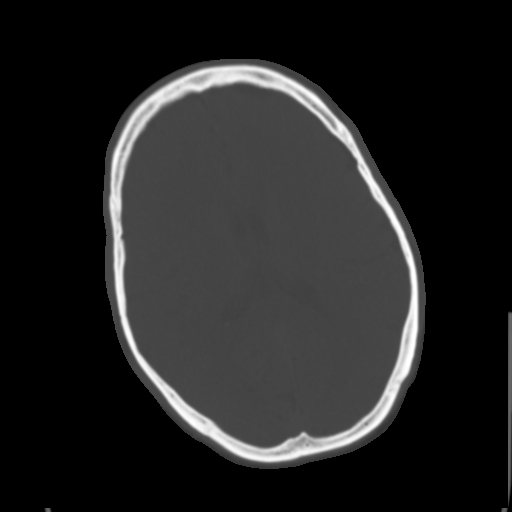
[im 18/31  brain]
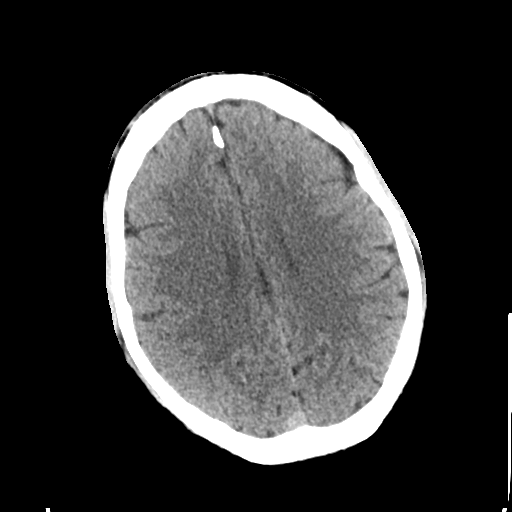
[im 20/31  brain]
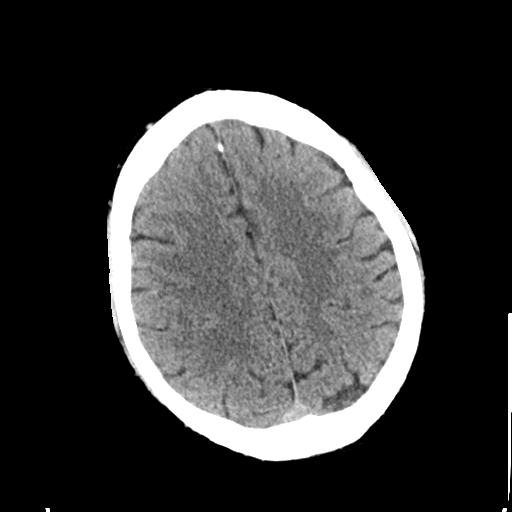
[im 22/31  brain]
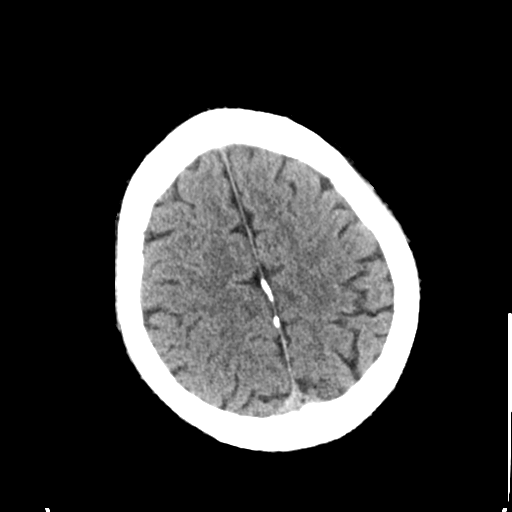
[im 23/31  brain]
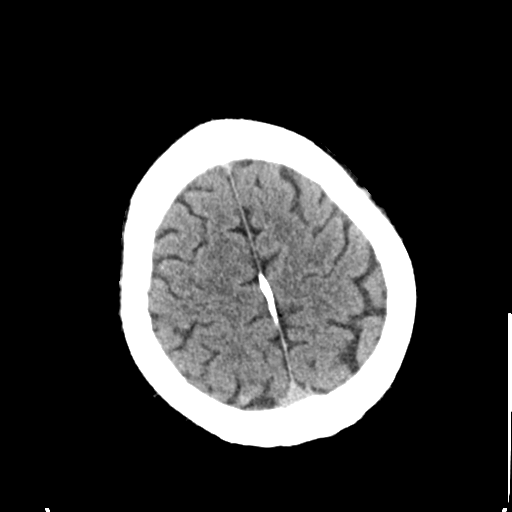
[im 23/31  bone]
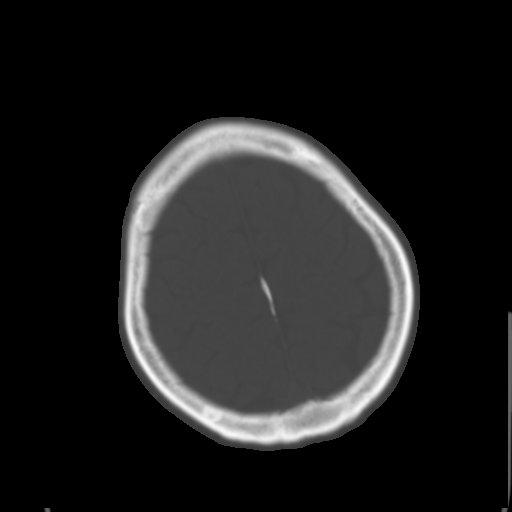
[im 25/31  brain]
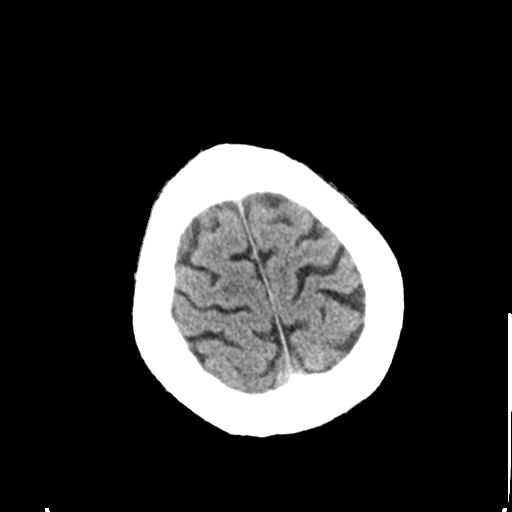
[im 27/31  brain]
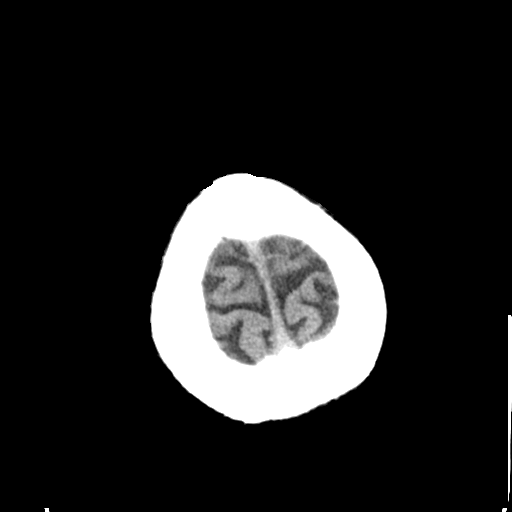
[im 29/31  brain]
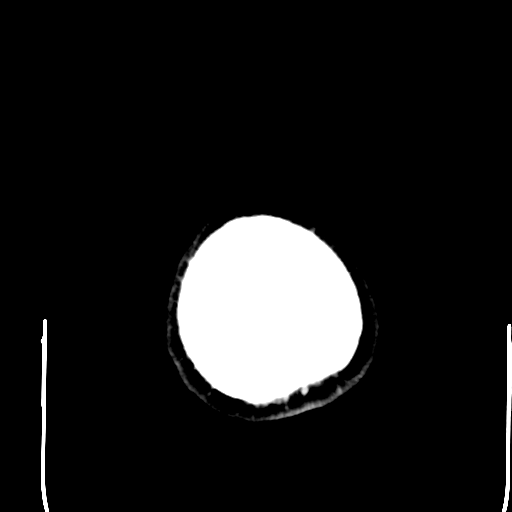

[16 of 30 positions shown; findings below may reference images not displayed]

FINDINGS: The ventricles and sulci are normal. No intraparenchymal hemorrhage,
mass effect nor midline shift. No acute large vascular territory
infarcts.

No abnormal extra-axial fluid collections. Basal cisterns are
patent. Trace calcific atherosclerosis of RIGHT vertebral artery.

No skull fracture. The included ocular globes and orbital contents
are non-suspicious. Minimal RIGHT posterior ethmoid mucosal
thickening without paranasal sinus air-fluid levels. The mastoid air
cells are well aerated.
IMPRESSION: No acute intracranial process ; normal noncontrast CT of the head.
If clinical concern acute ischemia, MRI of the brain including
diffusion weighted sequences would be more sensitive.

  By: Tran Reavis

## 2015-12-28 ENCOUNTER — Emergency Department
Admission: EM | Admit: 2015-12-28 | Discharge: 2015-12-28 | Disposition: A | Discharge status: Home / Self Care | Payer: No Typology Code available for payment source | Attending: Emergency Medicine | Admitting: Emergency Medicine

## 2015-12-28 ENCOUNTER — Emergency Department: Payer: Self-pay

## 2015-12-28 DIAGNOSIS — J028 Acute pharyngitis due to other specified organisms: Secondary | ICD-10-CM | POA: Insufficient documentation

## 2015-12-28 DIAGNOSIS — B9789 Other viral agents as the cause of diseases classified elsewhere: Secondary | ICD-10-CM | POA: Insufficient documentation

## 2015-12-28 DIAGNOSIS — J029 Acute pharyngitis, unspecified: Secondary | ICD-10-CM

## 2015-12-28 DIAGNOSIS — Z87891 Personal history of nicotine dependence: Secondary | ICD-10-CM | POA: Insufficient documentation

## 2015-12-28 LAB — GROUP A STREP, RAPID ANTIGEN: Group A Strep, Rapid Antigen: NEGATIVE

## 2015-12-28 MED ORDER — IBUPROFEN 400 MG PO TABS
800.0000 mg | ORAL_TABLET | Freq: Once | ORAL | Status: AC
Start: 2015-12-28 — End: 2015-12-28
  Administered 2015-12-28: 800 mg via ORAL
  Filled 2015-12-28: qty 2

## 2015-12-28 MED ORDER — ACETAMINOPHEN 500 MG PO TABS
1000.0000 mg | ORAL_TABLET | Freq: Four times a day (QID) | ORAL | Status: DC | PRN
Start: 2015-12-28 — End: 2016-01-09

## 2015-12-28 MED ORDER — IBUPROFEN 600 MG PO TABS
600.0000 mg | ORAL_TABLET | Freq: Four times a day (QID) | ORAL | Status: DC | PRN
Start: 2015-12-28 — End: 2016-01-09

## 2015-12-28 NOTE — ED Notes (Signed)
Patient arrived to the ER c/o sore throat, body aches, left ear pain, that started yesterday.

## 2015-12-28 NOTE — ED Notes (Signed)
Patient in no acute distress @ d/c. RR even and unlabored, skin pink warm and dry. Patient alert and oriented x4. Patient denied any pain @ d/c. Educated patient on scripts given, f/u care, and when to come back to the ER. Patient verbalized understanding of d/c instructions. Patient ambulated to the lobby, gait steady with no assistance.

## 2015-12-28 NOTE — ED Provider Notes (Signed)
EMERGENCY DEPARTMENT NOTE    Physician/Midlevel provider first contact with patient: 12/28/15 1056         HISTORY OF PRESENT ILLNESS   Historian: patient  Translator Used: no    49 y.o. male presents with sore throat.    1. Location of symptoms: sore throat  2. Onset of symptoms: yesterday  3. What was patient doing when symptoms started (Context): at work  4. Severity: moderate  5. Timing: gradual onset, constant  6. Activities that worsen symptoms: talking, swallowing  7. Activities that improve symptoms: none- no meds  8. Quality: sore throat   9. Radiation of symptoms: none  10. Associated signs and Symptoms: sneezing, myalgias   11. Are symptoms worsening? yes          MEDICAL HISTORY     Past Medical History:  History reviewed. No pertinent past medical history.    Past Surgical History:  Past Surgical History   Procedure Laterality Date   . Splenectomy, total         Social History:  Social History     Social History   . Marital Status: Single     Spouse Name: N/A   . Number of Children: N/A   . Years of Education: N/A     Occupational History   . Not on file.     Social History Main Topics   . Smoking status: Former Games developer   . Smokeless tobacco: Not on file   . Alcohol Use: No   . Drug Use: No   . Sexual Activity: Not on file     Other Topics Concern   . Not on file     Social History Narrative       Family History:  History reviewed. No pertinent family history.    Outpatient Medication:  Discharge Medication List as of 12/28/2015 11:40 AM          Allergies:  Allergies   Allergen Reactions   . Penicillins          REVIEW OF SYSTEMS   Review of Systems   Constitutional: Positive for chills. Negative for fever and malaise/fatigue.   HENT: Positive for sore throat.    Respiratory: Negative for cough and shortness of breath.    Cardiovascular: Negative for chest pain.   Musculoskeletal: Positive for myalgias.   Neurological: Negative for weakness.   All other systems reviewed and are  negative.        PHYSICAL EXAM     Filed Vitals:    12/28/15 1104   BP: 146/88   Pulse: 77   Temp: 98.3 F (36.8 C)   Resp: 18   SpO2: 94%       Nursing note and vitals reviewed.    Constitutional: non-toxic  Head: Atraumatic.  Eyes: PERRL. EOMI. No scleral icterus.  ENT: Mucous membranes are moist and intact. Oropharynx is clear. Patent airway. uvula swelling/erythema  Neck: Supple. No cervical lymphadenopathy.  Cardiovascular: Regular rate. Regular rhythm. No murmurs, rubs, or gallops.  Pulmonary/Chest: No evidence of respiratory distress. Slight wheeze right chest. No rhonchi or rales.  GI: Soft, non-distended abdomen. No tenderness to palpation of abdomen.  Extremities: No edema. No deformity.  Skin: No rash.   Neurological: Awake, alert and oriented x 3. CN II-XII intact. Strength intact. Sensation intact.  Psychiatric: Appropriate affect. Appropriate mood. Appropriate behavior.    MEDICAL DECISION MAKING   Viral pharyngitis vs. Strep. Strep test neg. Discussed plan. Discharged home.  DISCUSSION      Vital Signs: Reviewed the patient?s vital signs.   Nursing Notes: Reviewed and utilized available nursing notes.  Medical Records Reviewed: Reviewed available past medical records.  Counseling: The emergency provider has spoken with the patient and discussed today?s findings, in addition to providing specific details for the plan of care.  Questions are answered and there is agreement with the plan.    IMAGING STUDIES    The following imaging studies were independently interpreted by the Emergency Medicine Physician.  For full imaging study results please see chart.    CARDIAC STUDIES     The following cardiac studies were independently interpreted by the Emergency Medicine Physician. For full cardiac study results please see chart     PULSE OXIMETRY        EMERGENCY DEPT. MEDICATIONS      ED Medication Orders     Start Ordered     Status Ordering Provider    12/28/15 1135 12/28/15 1134  ibuprofen  (ADVIL,MOTRIN) tablet 800 mg   Once     Route: Oral  Ordered Dose: 800 mg     Last MAR action:  Given Maiyah Goyne WINDSOR          LABORATORY RESULTS    Ordered and independently interpreted AVAILABLE laboratory tests. Please see results section in chart for full details.  Results for orders placed or performed during the hospital encounter of 12/28/15   Rapid Strep   Result Value Ref Range    Group A Strep, Rapid Antigen Negative Negative       CONSULTATIONS        CRITICAL CARE        ATTESTATIONS        Physician Attestation: Darlyn Read MD, have been the primary provider for [name] during this Emergency Dept visit and have reviewed the chart for accuracy and agree with its content.       DIAGNOSIS      Diagnosis:  Final diagnoses:   Viral pharyngitis       Disposition:  ED Disposition     Discharge [name] discharge to home/self care.    Condition at disposition: Stable            Prescriptions:  Discharge Medication List as of 12/28/2015 11:40 AM      START taking these medications    Details   acetaminophen (TYLENOL) 500 MG tablet Take 2 tablets (1,000 mg total) by mouth every 6 (six) hours as needed., Starting 12/28/2015, Until Discontinued, Print      ibuprofen (ADVIL,MOTRIN) 600 MG tablet Take 1 tablet (600 mg total) by mouth every 6 (six) hours as needed., Starting 12/28/2015, Until Discontinued, Print                 Marland Mcalpine, MD  12/28/15 1312

## 2015-12-28 NOTE — Discharge Instructions (Signed)
Pharyngitis, Viral    You have been diagnosed with viral pharyngitis. This is the medical term for a sore throat.    Viral pharyngitis is an infection in the back of your throat and tonsils caused by a virus. It may look and feel like a Strep throat, but it usually occurs with other cold symptoms like a runny nose, sinus congestion, cough, fever (temperature higher than 100.4F / 38C), or muscle aches. It is sometimes hard to tell a viral sore throat from Strep throat. Your doctor may do a "rapid strep test" or culture to see which kind of infection you have.    Symptoms include fever, sore throat, painful swallowing, and headache. You may also have symptoms of the common cold and notice swollen tender neck glands. You might have pus or white spots on your tonsils.    Viral pharyngitis is treated with medication for pain and fever. Antibiotics DO NOT cure viral infections and may cause side-effects, like diarrhea, nausea, abdominal cramps or allergic reactions. Using antibiotics when they are not necessary can lead to resistance, meaning antibiotics will not work when you need them in the future.    YOU SHOULD SEEK MEDICAL ATTENTION IMMEDIATELY, EITHER HERE OR AT THE NEAREST EMERGENCY DEPARTMENT, IF ANY OF THE FOLLOWING OCCURS:   You have trouble breathing.   Your voice changes or sounds hoarse.   Your throat pain gets worse or you have neck pain.   You cannot swallow liquids or medication.   You drool or cannot swallow saliva.   You feel worse or don't improve after 2 to 3 days.

## 2016-01-09 ENCOUNTER — Emergency Department
Admission: EM | Admit: 2016-01-09 | Discharge: 2016-01-09 | Disposition: A | Discharge status: Home / Self Care | Payer: No Typology Code available for payment source | Attending: Emergency Medicine | Admitting: Emergency Medicine

## 2016-01-09 ENCOUNTER — Emergency Department: Payer: No Typology Code available for payment source

## 2016-01-09 DIAGNOSIS — J209 Acute bronchitis, unspecified: Secondary | ICD-10-CM | POA: Insufficient documentation

## 2016-01-09 DIAGNOSIS — Z9081 Acquired absence of spleen: Secondary | ICD-10-CM | POA: Insufficient documentation

## 2016-01-09 MED ORDER — PREDNISONE 20 MG PO TABS
60.0000 mg | ORAL_TABLET | Freq: Every day | ORAL | Status: AC
Start: 2016-01-09 — End: 2016-01-14

## 2016-01-09 MED ORDER — AZITHROMYCIN 250 MG PO TABS
250.0000 mg | ORAL_TABLET | Freq: Every day | ORAL | Status: AC
Start: 2016-01-09 — End: 2016-01-15

## 2016-01-09 MED ORDER — ALBUTEROL SULFATE HFA 108 (90 BASE) MCG/ACT IN AERS
2.0000 | INHALATION_SPRAY | RESPIRATORY_TRACT | Status: DC | PRN
Start: 2016-01-09 — End: 2016-08-17

## 2016-01-09 MED ORDER — IBUPROFEN 600 MG PO TABS
600.0000 mg | ORAL_TABLET | Freq: Four times a day (QID) | ORAL | Status: DC | PRN
Start: 2016-01-09 — End: 2016-08-17

## 2016-01-09 MED ORDER — ACETAMINOPHEN 325 MG PO TABS
650.0000 mg | ORAL_TABLET | ORAL | Status: AC | PRN
Start: 2016-01-09 — End: 2016-01-19

## 2016-01-09 MED ORDER — FLUTICASONE PROPIONATE 50 MCG/ACT NA SUSP
2.0000 | Freq: Every day | NASAL | Status: DC
Start: 2016-01-09 — End: 2016-02-08

## 2016-01-09 NOTE — Discharge Instructions (Signed)
Bronchitis     You have been diagnosed with bronchitis.     Bronchitis is an irritation of the large breathing tubes. It can be caused by tobacco smoke, air pollution, or an infection. Patients with bronchitis are short of breath and may cough up green or yellow mucous. These symptoms are usually worse at night, when lying flat and in wet weather. Most people with bronchitis do not need antibiotics. If your doctor prescribes antibiotics, fill the prescription and take all the medicine according to the instructions.     Bronchitis is usually treated with medicine to help stop coughing. An inhaler with albuterol (Ventolin®/Proventil®) is sometimes used to help with cough. It is best to use the inhaler with a spacer to help the medicine reach your lungs. The doctor can prescribe a spacer.     Bronchitis is usually caused by a virus. Antibiotics do not kill viruses. In fact, antibiotics do not affect viruses in any way. In the past, some doctors prescribed antibiotics for people with bronchitis. We now know that antibiotics are not helpful for most bronchitis patients. Patients who might need antibiotics are those with lung problems that don’t go away, like emphysema or COPD.     Your coughing and wheezing might last for 2 or 3 weeks! The symptoms should get better over this time period and not worse.     Your doctor prescribed an albuterol (Ventolin®/Proventil®) inhaler. Use it every four hours if you are wheezing, coughing, or short of breath. This will reduce symptoms.     Do not smoke. Research shows that smoking causes heart disease, cancer, and birth defects. Avoiding smoking will help your asthma. If you smoke, ask your doctor for ideas about how to stop.  · If you do not smoke, avoid others who do.     YOU SHOULD SEEK MEDICAL ATTENTION IMMEDIATELY, EITHER HERE OR AT THE NEAREST EMERGENCY DEPARTMENT, IF ANY OF THE FOLLOWING OCCURS:  · You wheeze or have trouble breathing.  · You have a fever (temperature higher  than 100.4ºF / 38ºC), that won’t go away.  · You have chest pain.  · You vomit or cannot keep liquids down or you feel weak or dizzy.  · Your symptoms get worse over the next 2 or 3 days.

## 2016-01-09 NOTE — ED Provider Notes (Signed)
Physician/Midlevel provider first contact with patient: 01/09/16 0703         History     Chief Complaint   Patient presents with   . URI     Patient is a 50 y.o. male presenting with URI. The history is provided by the patient and medical records. No language interpreter was used.   URI  Presenting symptoms: congestion, cough and sore throat    Severity:  Moderate  Onset quality:  Gradual  Duration:  2 weeks  Timing:  Intermittent  Progression:  Worsening  Chronicity:  New  Relieved by:  Nothing  Worsened by:  Nothing tried  Ineffective treatments:  None tried  Associated symptoms: myalgias and wheezing    Associated symptoms: no neck pain    Risk factors: sick contacts    Risk factors: no chronic cardiac disease, no chronic kidney disease and no diabetes mellitus     pt s/p splenectomy.  Pt seemed a little better after last ED visit but then got worse again just after New Years Day.    Cough, congestion, myalgias, and sore throat.        History reviewed. No pertinent past medical history.    Past Surgical History   Procedure Laterality Date   . Splenectomy, total         History reviewed. No pertinent family history.    Social  Social History   Substance Use Topics   . Smoking status: Former Games developer   . Smokeless tobacco: None   . Alcohol Use: Yes       .     Allergies   Allergen Reactions   . Penicillins        Home Medications                                             Review of Systems   HENT: Positive for congestion and sore throat.    Respiratory: Positive for cough and wheezing.    Cardiovascular: Negative for palpitations and leg swelling.   Gastrointestinal: Negative for abdominal pain.   Musculoskeletal: Positive for myalgias. Negative for neck pain.   All other systems reviewed and are negative.      Physical Exam    BP: (!) 137/92 mmHg, Heart Rate: 80, Temp: 98.1 F (36.7 C), Resp Rate: 18, SpO2: 97 %, Weight: 108.863 kg    Physical Exam  Nursing note and vitals reviewed.  Constitutional:  Well  developed, well nourished. Awake & Oriented x3.  Head:  Atraumatic. Normocephalic.    Eyes:  PERRL. EOMI. Conjunctivae are not pale.  ENT:  Mucous membranes are moist and intact. Oropharynx is clear and symmetric.  Patent airway.  Neck:  Supple. Full ROM.    Cardiovascular:  Regular rate. Regular rhythm. No murmurs, rubs, or gallops.  Pulmonary/Chest:  No evidence of respiratory distress. faint expiratory wheezing.  Abdominal:  Soft and non-distended. There is no tenderness. No rebound, guarding, or rigidity.  Back:  Full ROM. Nontender.  Extremities:  No edema. No cyanosis. No clubbing. Full range of motion in all extremities.  Skin:  Skin is warm and dry.  No diaphoresis. No rash.   Neurological:  Alert, awake, and appropriate. Normal speech. Motor normal.  Psychiatric:  Good eye contact. Normal interaction, affect, and behavior.    Pox 97 ra normal no tx needed.    MDM  and ED Course     ED Medication Orders     None             MDM  Given fact patient is sp splenectomy and requests abx will provide.   Pt has apt. With new pmd coming up.  Asked him to return if worse.  VSS.  I do not feel testing would change management.      Procedures    Clinical Impression & Disposition     Clinical Impression  Final diagnoses:   Bronchitis, acute, with bronchospasm        ED Disposition     Discharge [name] discharge to home/self care.    Condition at disposition: Stable             New Prescriptions    ACETAMINOPHEN (TYLENOL) 325 MG TABLET    Take 2 tablets (650 mg total) by mouth every 4 (four) hours as needed for Pain.    ALBUTEROL (PROAIR HFA) 108 (90 BASE) MCG/ACT INHALER    Inhale 2 puffs into the lungs every 4 (four) hours as needed for Wheezing.    AZITHROMYCIN (ZITHROMAX Z-PAK) 250 MG TABLET    Take 1 tablet (250 mg total) by mouth daily. 2 tablets on day one, then 1 tablet on days 2 through 5    FLUTICASONE (FLONASE) 50 MCG/ACT NASAL SPRAY    2 sprays by Nasal route daily.    IBUPROFEN (ADVIL,MOTRIN) 600 MG  TABLET    Take 1 tablet (600 mg total) by mouth every 6 (six) hours as needed for Pain.    PREDNISONE (DELTASONE) 20 MG TABLET    Take 3 tablets (60 mg total) by mouth daily.                   Kennith Maes, MD  01/09/16 [id number]

## 2016-01-09 NOTE — ED Notes (Addendum)
Bill Kaiser is a 50 y.o. male came in ambulatory to ER complaining of nasal congestion, cough, runny nose, sore throat, body aches x 10 days, was seen here last week and was diagnosed with pharyngitis. Felt better then felt worse again. Denies fever but reports chills. + White to yellowish productive cough. Pt states it hurts to take a deep breath only yesterday

## 2016-06-24 ENCOUNTER — Emergency Department (HOSPITAL_COMMUNITY)
Admission: EM | Admit: 2016-06-24 | Discharge: 2016-06-24 | Disposition: A | Discharge status: Home / Self Care | Payer: Medicaid Other | Attending: Emergency Medicine | Admitting: Emergency Medicine

## 2016-06-24 ENCOUNTER — Encounter (HOSPITAL_COMMUNITY): Payer: Self-pay | Admitting: Nurse Practitioner

## 2016-06-24 DIAGNOSIS — Z7901 Long term (current) use of anticoagulants: Secondary | ICD-10-CM | POA: Insufficient documentation

## 2016-06-24 DIAGNOSIS — M545 Low back pain: Secondary | ICD-10-CM | POA: Insufficient documentation

## 2016-06-24 DIAGNOSIS — Z95810 Presence of automatic (implantable) cardiac defibrillator: Secondary | ICD-10-CM | POA: Insufficient documentation

## 2016-06-24 DIAGNOSIS — M549 Dorsalgia, unspecified: Secondary | ICD-10-CM

## 2016-06-24 DIAGNOSIS — I509 Heart failure, unspecified: Secondary | ICD-10-CM | POA: Diagnosis not present

## 2016-06-24 DIAGNOSIS — I110 Hypertensive heart disease with heart failure: Secondary | ICD-10-CM | POA: Insufficient documentation

## 2016-06-24 DIAGNOSIS — I2510 Atherosclerotic heart disease of native coronary artery without angina pectoris: Secondary | ICD-10-CM | POA: Insufficient documentation

## 2016-06-24 DIAGNOSIS — Z7982 Long term (current) use of aspirin: Secondary | ICD-10-CM | POA: Insufficient documentation

## 2016-06-24 DIAGNOSIS — I252 Old myocardial infarction: Secondary | ICD-10-CM | POA: Diagnosis not present

## 2016-06-24 DIAGNOSIS — Z79899 Other long term (current) drug therapy: Secondary | ICD-10-CM | POA: Diagnosis not present

## 2016-06-24 DIAGNOSIS — Z951 Presence of aortocoronary bypass graft: Secondary | ICD-10-CM | POA: Diagnosis not present

## 2016-06-24 MED ORDER — METHOCARBAMOL 500 MG PO TABS: 500.0000 mg | ORAL_TABLET | Freq: Two times a day (BID) | ORAL | Status: AC

## 2016-06-24 MED ORDER — NAPROXEN 500 MG PO TABS: 500.0000 mg | ORAL_TABLET | Freq: Two times a day (BID) | ORAL | Status: AC

## 2016-06-24 NOTE — ED Notes (Signed)
He c/o 1 day history of lower back pain. Onset after moving furniture. He tried lidocaine patches and percocet at home with some relief. He denies bowel/bladder changes. He is ambulatory, mae

## 2016-06-24 NOTE — Discharge Instructions (Signed)
Take the prescribed medication as directed. °Follow-up with your primary care doctor. °Return to the ED for new or worsening symptoms. °

## 2016-06-24 NOTE — ED Notes (Signed)
Declined W/C at D/C and was escorted to lobby by RN. 

## 2016-06-24 NOTE — ED Provider Notes (Signed)
CSN: [id number]     Arrival date & time 06/24/16  1046 History  By signing my name below, I, Emman2uella Mensah, attest that this documentation has been prepared under the direction and in the presence of  Sharilyn Sites, PA-C. Electronically Signed: Angelene Giovanni, ED Scribe. 06/24/2016. 12:49 PM.     Chief Complaint  Patient presents with  . Back Pain   The history is provided by the patient. No language interpreter was used.   HPI Comments: Edward Haley is a 50 y.o. male with a hx of HIV, HTN, CAD, and chronic lower back pain who presents to the Emergency Department complaining of gradually worsening acute lower back pain onset yesterday. Pt explains that he was in town from [place] for his sister's funeral where he helped move furniture. He notes that this acute lower back pain began after lifting the furniture. No alleviating factors noted. Pt has tried Percocet and Lidocaine patch PTA with mild relief. He states that his chronic back pain is usually on his right back. Pt is right hand dominate. He denies any fever, leg numbness/weakness, bowel/bladder incontinence, or rash.   Past Medical History  Diagnosis Date  . HIV infection (HCC) dx'd 1993  . Hypertension   . Coronary artery disease   . Hyperlipidemia   . Automatic implantable cardioverter-defibrillator in situ   . CHF (congestive heart failure) (HCC)   . MI (myocardial infarction) (HCC) 2011    x3  . GERD (gastroesophageal reflux disease)   . History of stomach ulcers   . Daily headache   . Migraine     "qd" (04/26/2014)  . Chronic lower back pain    Past Surgical History  Procedure Laterality Date  . Cardiac defibrillator placement  04/26/2014  . Coronary angioplasty with stent placement  03/11/2013    Dr Salomon Fick [facility] [place] - Promus Premiere Monorail 2.5x44mm to RCA & 2.5x19mm to RCA  . Coronary angioplasty with stent placement  02/11/2010    [facility] [place] -  Multi-Link 5.0x56mm to LAD  . Tonsillectomy    . Cystectomy  1990's    "2 taken off my bottom" (04/26/2014)  . Implantable cardioverter defibrillator implant N/A 04/26/2014    Procedure: IMPLANTABLE CARDIOVERTER DEFIBRILLATOR IMPLANT;  Surgeon: Thurmon Fair, MD;  Location: [facility] CATH LAB;  Service: Cardiovascular;  Laterality: N/A;   Family History  Problem Relation Age of Onset  . Stroke Mother   . Hypertension Mother   . Diabetes Mother   . Heart disease Sister   . Cancer Sister    Social History  Substance Use Topics  . Smoking status: Never Smoker   . Smokeless tobacco: Never Used  . Alcohol Use: No    Review of Systems  Constitutional: Negative for fever.  Musculoskeletal: Positive for back pain.  Skin: Negative for rash.  All other systems reviewed and are negative.     Allergies  Shellfish allergy  Home Medications   Prior to Admission medications   Medication Sig Start Date End Date Taking? Authorizing Provider  aspirin EC 325 MG tablet Take 325 mg by mouth daily.    Historical Provider, MD  carvedilol (COREG) 12.5 MG tablet Take 1 tablet (12.5 mg total) by mouth 2 (two) times daily with a meal. 02/07/15   [name], MD  clopidogrel (PLAVIX) 75 MG tablet Take 1 tablet (75 mg total) by mouth daily with breakfast. 02/07/15   [name], MD  cyclobenzaprine (FLEXERIL) 10 MG tablet Take  1 tablet (10 mg total) by mouth 2 (two) times daily as needed for muscle spasms. Patient not taking: Reported on 02/23/2015 10/31/14   [name], PA-C  diphenhydrAMINE (BENADRYL) 25 mg capsule Take 25 mg by mouth 2 (two) times daily.     Historical Provider, MD  dronabinol (MARINOL) 2.5 MG capsule Take 1 capsule (2.5 mg total) by mouth 2 (two) times daily before a meal. 02/07/15   [name], MD  elvitegravir-cobicistat-emtricitabine-tenofovir (STRIBILD) 150-150-200-300 MG TABS tablet Take 1 tablet by mouth daily with breakfast. 02/23/15   [name], MD  ENSURE PLUS (ENSURE  PLUS) LIQD Take 237 mLs by mouth 2 (two) times daily between meals.    Historical Provider, MD  fluticasone (FLONASE) 50 MCG/ACT nasal spray Place 2 sprays into both nostrils daily. 05/03/15   [name], MD  gabapentin (NEURONTIN) 100 MG capsule Take 100 mg by mouth 3 (three) times daily.    Historical Provider, MD  HYDROcodone-homatropine (HYCODAN) 5-1.5 MG/5ML syrup Take 5 mLs by mouth every 6 (six) hours as needed for cough. Patient not taking: Reported on 02/23/2015 02/18/15   [name], MD  lisinopril (ZESTRIL) 10 MG tablet Take 0.5 tablets (5 mg total) by mouth daily. 02/07/15   [name], MD  Multiple Vitamin (MULTIVITAMIN WITH MINERALS) TABS tablet Take 1 tablet by mouth at bedtime.    Historical Provider, MD  nitroGLYCERIN (NITROSTAT) 0.4 MG SL tablet Place 1 tablet (0.4 mg total) under the tongue every 5 (five) minutes as needed for chest pain. 02/07/15   [name], MD  oseltamivir (TAMIFLU) 75 MG capsule Take 1 capsule (75 mg total) by mouth every 12 (twelve) hours. Patient not taking: Reported on 02/23/2015 02/18/15   [name], MD  predniSONE (DELTASONE) 20 MG tablet Take 2 tablets (40 mg total) by mouth daily. Patient not taking: Reported on 02/23/2015 01/19/15   [name], NP  promethazine (PHENERGAN) 50 MG tablet Take 1 tablet (50 mg total) by mouth every 8 (eight) hours as needed for nausea or vomiting. 02/07/15   [name], MD  rosuvastatin (CRESTOR) 40 MG tablet Take 1 tablet (40 mg total) by mouth at bedtime. 03/15/14   [name], MD  tadalafil (CIALIS) 20 MG tablet Take 1 tablet (20 mg total) by mouth daily as needed for erectile dysfunction. 02/07/15   [name], MD  Vitamin D, Ergocalciferol, (DRISDOL) 50000 UNITS CAPS capsule Take 1 capsule (50,000 Units total) by mouth every 7 (seven) days. 03/15/14   [name], MD   BP 104/74 mmHg  Pulse 70  Temp(Src) 98.7 F (37.1 C) (Oral)  Resp 17  SpO2 100%   Physical Exam  Constitutional: He is  oriented to person, place, and time. He appears well-developed and well-nourished.  HENT:  Head: Normocephalic and atraumatic.  Mouth/Throat: Oropharynx is clear and moist.  Eyes: Conjunctivae and EOM are normal. Pupils are equal, round, and reactive to light.  Neck: Normal range of motion.  Cardiovascular: Normal rate, regular rhythm and normal heart sounds.   Pulmonary/Chest: Effort normal and breath sounds normal.  Abdominal: Soft. Bowel sounds are normal.  Musculoskeletal: Normal range of motion. He exhibits tenderness.  Lumbar spine with right paraspinal musculature tenderness, no midline deformity or step off, Full ROM maintained, normal sensation and strength of BLE, normal gait.   Neurological: He is alert and oriented to person, place, and time.  Skin: Skin is warm and dry.  Psychiatric: He has a normal mood and affect.  Nursing note  and vitals reviewed.   ED Course  Procedures (including critical care time) DIAGNOSTIC STUDIES: Oxygen Saturation is 100% on RA, normal by my interpretation.    COORDINATION OF CARE: 12:47 PM- Pt advised of plan for treatment and pt agrees. Pt will receive Robaxin and naprosyn.   MDM   Final diagnoses:  Back pain, unspecified location   50 year old male here with back pain. He has a history of chronic back pain. This appears to be acute exacerbation after helping move furniture after his sister's death. He has tenderness of the right lumbar paraspinal muscles. No midline deformity or step-off. Remains neurologically intact. No red flag symptoms concerning for cauda equina. Patient is afebrile, nontoxic. He has Percocet and lidocaine patches at home. Have given prescriptions for Robaxin and Naprosyn. Follow-up with PCP.  Discussed plan with patient, he/she acknowledged understanding and agreed with plan of care.  Return precautions given for new or worsening symptoms.  I personally performed the services described in this documentation, which was  scribed in my presence. The recorded information has been reviewed and is accurate.  Garlon Hatchet, PA-C 06/24/16 1318  Blane Ohara, MD 06/24/16 1524

## 2016-08-17 ENCOUNTER — Emergency Department
Admission: EM | Admit: 2016-08-17 | Discharge: 2016-08-17 | Disposition: A | Discharge status: Home / Self Care | Payer: No Typology Code available for payment source | Attending: Emergency Medicine | Admitting: Emergency Medicine

## 2016-08-17 ENCOUNTER — Other Ambulatory Visit: Payer: Self-pay

## 2016-08-17 ENCOUNTER — Emergency Department: Payer: No Typology Code available for payment source

## 2016-08-17 DIAGNOSIS — J189 Pneumonia, unspecified organism: Secondary | ICD-10-CM | POA: Insufficient documentation

## 2016-08-17 LAB — CBC AND DIFFERENTIAL
Absolute NRBC: 0 10*3/uL
Basophils Absolute Automated: 0.02 10*3/uL (ref 0.00–0.20)
Basophils Automated: 0.2 %
Eosinophils Absolute Automated: 0.09 10*3/uL (ref 0.00–0.70)
Eosinophils Automated: 1 %
Hematocrit: 45.7 % (ref 42.0–52.0)
Hgb: 15.3 g/dL (ref 13.0–17.0)
Immature Granulocytes Absolute: 0.02 10*3/uL
Immature Granulocytes: 0.2 %
Lymphocytes Absolute Automated: 1.41 10*3/uL (ref 0.50–4.40)
Lymphocytes Automated: 16 %
MCH: 30.1 pg (ref 28.0–32.0)
MCHC: 33.5 g/dL (ref 32.0–36.0)
MCV: 90 fL (ref 80.0–100.0)
MPV: 10.3 fL (ref 9.4–12.3)
Monocytes Absolute Automated: 0.83 10*3/uL (ref 0.00–1.20)
Monocytes: 9.4 %
Neutrophils Absolute: 6.45 10*3/uL (ref 1.80–8.10)
Neutrophils: 73.2 %
Nucleated RBC: 0 /100 WBC (ref 0.0–1.0)
Platelets: 216 10*3/uL (ref 140–400)
RBC: 5.08 10*6/uL (ref 4.70–6.00)
RDW: 12 % (ref 12–15)
WBC: 8.82 10*3/uL (ref 3.50–10.80)

## 2016-08-17 LAB — BASIC METABOLIC PANEL
BUN: 10 mg/dL (ref 9.0–28.0)
CO2: 21 mEq/L — ABNORMAL LOW (ref 22–29)
Calcium: 8.9 mg/dL (ref 8.5–10.5)
Chloride: 105 mEq/L (ref 100–111)
Creatinine: 1.1 mg/dL (ref 0.7–1.3)
Glucose: 98 mg/dL (ref 70–100)
Potassium: 4.4 mEq/L (ref 3.5–5.1)
Sodium: 136 mEq/L (ref 136–145)

## 2016-08-17 LAB — GFR: EGFR: >60

## 2016-08-17 MED ORDER — SODIUM CHLORIDE 0.9 % IV BOLUS
1000.0000 mL | Freq: Once | INTRAVENOUS | Status: AC
Start: 2016-08-17 — End: 2016-08-17
  Administered 2016-08-17: 1000 mL via INTRAVENOUS

## 2016-08-17 MED ORDER — LEVOFLOXACIN IN D5W 750 MG/150ML IV SOLN
750.0000 mg | Freq: Once | INTRAVENOUS | Status: AC
Start: 2016-08-17 — End: 2016-08-17
  Administered 2016-08-17: 750 mg via INTRAVENOUS
  Filled 2016-08-17: qty 150

## 2016-08-17 MED ORDER — BUTALBITAL-APAP-CAFFEINE 50-325-40 MG PO TABS
1.0000 | ORAL_TABLET | ORAL | 0 refills | Status: DC | PRN
Start: 2016-08-17 — End: 2018-05-07
  Filled 2016-08-17: qty 15, 3d supply, fill #0

## 2016-08-17 MED ORDER — IBUPROFEN 400 MG PO TABS
800.0000 mg | ORAL_TABLET | Freq: Once | ORAL | Status: AC
Start: 2016-08-17 — End: 2016-08-17
  Administered 2016-08-17: 800 mg via ORAL
  Filled 2016-08-17: qty 2

## 2016-08-17 MED ORDER — LEVOFLOXACIN 500 MG PO TABS
500.0000 mg | ORAL_TABLET | Freq: Every day | ORAL | 0 refills | Status: DC
Start: 2016-08-17 — End: 2016-08-18
  Filled 2016-08-17: qty 10, 10d supply, fill #0

## 2016-08-17 MED ORDER — IBUPROFEN 800 MG PO TABS
800.0000 mg | ORAL_TABLET | Freq: Three times a day (TID) | ORAL | 0 refills | Status: DC | PRN
Start: 2016-08-17 — End: 2018-05-13
  Filled 2016-08-17: qty 30, 10d supply, fill #0

## 2016-08-17 NOTE — ED Provider Notes (Signed)
[facility] EMERGENCY DEPARTMENT APP H&P      Visit date: 08/17/2016      CLINICAL SUMMARY          Diagnosis:    .     Final diagnoses:   Pneumonia due to organism         MDM Notes:              Disposition:          ED Disposition     Discharge Bill Kaiser discharge to home/self care.    Condition at disposition: Stable                       CLINICAL INFORMATION        HPI:      Chief Complaint: Headache; Sore Throat; and Cough  .    Bill Kaiser is a 50 y.o. male that  has no past medical history on file.  Presents with 5 days of headache, cough with phlegm, sore throat and fever.  101.4 t max. Controlled with tylenol. No sob or chest pain. + sick contact, son was diagnosed with pneumonia last week. Here.  + neck pain in same area as DDD.  No stiffness.  No nasal congestion.       History obtained from: patient          ROS:      Positive and negative ROS elements as per HPI.  All other systems reviewed and negative.      Physical Exam:      Pulse 96  BP 130/84 mmHg  Resp 18  SpO2 96 %  Temp 98.6 F (37 C)            Constitutional: Vital signs reviewed. Alert and oriented x 3     Head: Normocephalic, atraumatic  Eyes: PERRLA, EOMI bilateral, Conjunctiva Normal,  No discharge. Lids without swelling or lesions  Ears: Normal pinna, EAC patent, TMs normal and intact  Nose: Symmetrical, Mucous membranes normal, No septal changes  Throat: Tonsils without swelling, exudate or erythema.  Mouth:  MM moist, no lesions or masses. Good dentition.  Neck: Normal range of motion. Mild tenderness to posterior spine . No thyroid enlargement or mass  Respiratory/Chest: + Rales bilateral that clears with cough. No retractions or stridor. No wheezing.  No respiratory distress.   Cardiovascular: Heart: Regular rate and rhythm. No murmur/gallops or rubs. Pulses: + 2 bilateral radial and Dorsalis pedis. Cap refill less than 3 sec.    Abdomen: Soft and non-tender. No guarding. No masses or  hepatosplenomegaly. Normo-active bowel sounds.   UpperExtremity: No edema or cyanosis.  LowerExtremity: No edema. No cyanosis.  Neurological: CN II-XII grossly intact.  No focal motor deficits by observation. Speech normal. Sensory Intact to light touch all distal extremeties.  Msk: Upper and lower extremities non-tender, bilateral upper and lower joints with Full ROM and non-tender,  Back:  Full ROM, no midline or muscle tenderness, No deformity or masses. Skin: Warm and dry. No rash. Good turgor.   Lymphatic: No lymphadenopathy.  Psychiatric: Normal affect. Normal concentration.              PAST HISTORY        Primary Care Provider: Christa See, MD        PMH/PSH:    .     History reviewed. No pertinent past medical history.    He has past surgical history that includes Splenectomy, total.  Social/Family History:      He reports that he has quit smoking. He does not have any smokeless tobacco history on file. He reports that he drinks alcohol. He reports that he does not use illicit drugs.    No family history on file.      Listed Medications on Arrival:    .     Home Medications     Last Medication Reconciliation Action:  In Progress Orlene Och, RN 08/17/2016 10:28 AM          No Medications          Allergies: He is allergic to penicillins.            VISIT INFORMATION        Clinical Course in the ED:            Medications Given in the ED:    .     ED Medication Orders     Start Ordered     Status Ordering Provider    08/17/16 1106 08/17/16 1105  sodium chloride 0.9 % bolus 1,000 mL   Once     Route: Intravenous  Ordered Dose: 1,000 mL     Last MAR action:  Stopped Almira Coaster    08/17/16 1059 08/17/16 1059  levoFLOXacin (LEVAQUIN) 750mg  in D5W IVPB (premix)   Once     Route: Intravenous  Ordered Dose: 750 mg     Last MAR action:  Stopped Almira Coaster    08/17/16 1036 08/17/16 1035  sodium chloride 0.9 % bolus 1,000 mL   Once     Route: Intravenous  Ordered Dose:  1,000 mL     Last MAR action:  Stopped Almira Coaster    08/17/16 1036 08/17/16 1035  ibuprofen (ADVIL,MOTRIN) tablet 800 mg   Once     Route: Oral  Ordered Dose: 800 mg     Last MAR action:  Given Almira Coaster            Procedures:      Procedures      Interpretations:                  RESULTS        Lab Results:      Results     Procedure Component Value Units Date/Time    Basic Metabolic Panel [[id number]]  (Abnormal) Collected:  08/17/16 1059    Specimen Information:  Blood Updated:  08/17/16 1145     Glucose 98 mg/dL      BUN 40.9 mg/dL      Creatinine 1.1 mg/dL      Calcium 8.9 mg/dL      Sodium 811 mEq/L      Potassium 4.4 mEq/L      Chloride 105 mEq/L      CO2 21 (L) mEq/L     GFR [[id number]] Collected:  08/17/16 1059     EGFR >60.0 Updated:  08/17/16 1145    CBC with differential [[id number]] Collected:  08/17/16 1059    Specimen Information:  Blood from Blood Updated:  08/17/16 1116     WBC 8.82 x10 3/uL      Hgb 15.3 g/dL      Hematocrit 46.9 %      Platelets 216 x10 3/uL      RBC 5.08 x10 6/uL      MCV 90.0 fL      MCH 30.1 pg  MCHC 33.5 g/dL      RDW 12 %      MPV 10.3 fL      Neutrophils 73.2 %      Lymphocytes Automated 16.0 %      Monocytes 9.4 %      Eosinophils Automated 1.0 %      Basophils Automated 0.2 %      Immature Granulocyte 0.2 %      Nucleated RBC 0.0 /100 WBC      Neutrophils Absolute 6.45 x10 3/uL      Abs Lymph Automated 1.41 x10 3/uL      Abs Mono Automated 0.83 x10 3/uL      Abs Eos Automated 0.09 x10 3/uL      Absolute Baso Automated 0.02 x10 3/uL      Absolute Immature Granulocyte 0.02 x10 3/uL      Absolute NRBC 0.00 x10 3/uL     UA, Reflex to Microscopic (pts  3 + yrs) [[id number]] Collected:  08/17/16 1059    Specimen Information:  Urine Updated:  08/17/16 1100              Radiology Results:      XR Chest 2 Views   Final Result    Right lower lobe infiltrate.      Prince Solian, MD    08/17/2016 10:58 AM                     Scribe Attestation:      No scribe  involved in the care of this patient                             Almira Coaster, [place]  08/18/16 [phone]

## 2016-08-17 NOTE — Discharge Instructions (Signed)
Pneumonia     You have been diagnosed with pneumonia.     Pneumonia is an infection of the small air tubes and sacs of the lungs. It can be caused by a virus, bacteria or fungus.      Symptoms include fever (temperature higher than 100.4ºF / 38ºC), shortness of breath and a cough that produces a green or yellow material. You may have chest pain, vomiting, or headache.     Some people with pneumonia must stay in the hospital. Patients with certain risk factors, like diabetes, cancer, alcoholism, or another chronic medical condition, can be difficult to treat. The physician has determined that it is OK for you to go home.     Follow up closely with your doctor.     Do not smoke. Smoking may cause your pneumonia symptoms to become worse. Smoking also causes heart disease, cancer, and birth defects. If you smoke, ask your doctor for ideas about how to quit.  · If you do not smoke, avoid others who do. Smoke will irritate your lungs and make your breathing worse while you have pneumonia.     YOU SHOULD SEEK MEDICAL ATTENTION IMMEDIATELY, EITHER HERE OR AT THE NEAREST EMERGENCY DEPARTMENT, IF ANY OF THE FOLLOWING OCCURS:  · You wheeze or have trouble breathing.  · You have a fever (temperature higher than 100.4ºF / 38ºC) that won’t go away.  · You cough up blood.  · You have chest pain.  · You vomit and cannot keep medications down or you feel dizzy, weak or confused.  · You feel worse or don’t improve in 2 to 3 days.  · You are unable to perform your normal activities.

## 2016-08-17 NOTE — ED Provider Notes (Signed)
This patient was seen by the PA and the HPI, PE and plan were discussed with me. I saw pt and discussed plan and answered questions.    HPI: Presents with 5 days of headache, cough with phlegm, sore throat and fever.  PE nontoxic afebrile not tachypneis sat 96 room air    MDM Pneumonia s/p splemectomy  Needs IV dose abx due to splenectomy  PSI 50 supports outpt treatment    Attestations  I was acting as a Neurosurgeon for [employer], MD on [name],Bill Kaiser  Treatment Team: Scribe: Araceli Bouche    I am the first provider for this patient and I personally performed the services documented. Treatment Team: Scribe: Araceli Bouche is scribing for me on [name],Bill Kaiser. This note accurately reflects work and decisions made by me.  Samuel Bouche, MD    Samuel Bouche, MD  08/23/16 [phone]

## 2016-08-18 ENCOUNTER — Other Ambulatory Visit: Payer: Self-pay

## 2016-08-18 MED ORDER — LEVOFLOXACIN 500 MG PO TABS
500.0000 mg | ORAL_TABLET | Freq: Every day | ORAL | Status: AC
Start: 2016-08-18 — End: 2016-08-28

## 2017-10-23 NOTE — Telephone Encounter (Signed)
No additional notes

## 2017-11-22 ENCOUNTER — Emergency Department: Payer: Self-pay

## 2017-11-22 ENCOUNTER — Emergency Department
Admission: EM | Admit: 2017-11-22 | Discharge: 2017-11-22 | Disposition: A | Discharge status: Home / Self Care | Payer: Self-pay | Attending: Emergency Medicine | Admitting: Emergency Medicine

## 2017-11-22 DIAGNOSIS — Z9081 Acquired absence of spleen: Secondary | ICD-10-CM | POA: Insufficient documentation

## 2017-11-22 DIAGNOSIS — B349 Viral infection, unspecified: Secondary | ICD-10-CM | POA: Insufficient documentation

## 2017-11-22 DIAGNOSIS — J9801 Acute bronchospasm: Secondary | ICD-10-CM | POA: Insufficient documentation

## 2017-11-22 DIAGNOSIS — Z87891 Personal history of nicotine dependence: Secondary | ICD-10-CM | POA: Insufficient documentation

## 2017-11-22 MED ORDER — ALBUTEROL SULFATE HFA 108 (90 BASE) MCG/ACT IN AERS
2.0000 | INHALATION_SPRAY | Freq: Once | RESPIRATORY_TRACT | Status: AC
Start: 2017-11-22 — End: 2017-11-22
  Administered 2017-11-22: 2 via RESPIRATORY_TRACT
  Filled 2017-11-22: qty 1

## 2017-11-22 MED ORDER — AZITHROMYCIN 250 MG PO TABS
500.0000 mg | ORAL_TABLET | Freq: Once | ORAL | Status: AC
Start: 2017-11-22 — End: 2017-11-22
  Administered 2017-11-22: 500 mg via ORAL
  Filled 2017-11-22: qty 2

## 2017-11-22 MED ORDER — AZITHROMYCIN 250 MG PO TABS
250.0000 mg | ORAL_TABLET | Freq: Every day | ORAL | 0 refills | Status: AC
Start: 2017-11-22 — End: 2017-11-26

## 2017-11-22 MED ORDER — DEXAMETHASONE SODIUM PHOSPHATE 10 MG/ML IJ SOLN
10.0000 mg | Freq: Once | INTRAMUSCULAR | Status: AC
Start: 2017-11-22 — End: 2017-11-22
  Administered 2017-11-22: 10 mg via ORAL
  Filled 2017-11-22: qty 1

## 2017-11-22 MED ORDER — FLUTICASONE PROPIONATE 50 MCG/ACT NA SUSP
2.0000 | Freq: Every day | NASAL | 0 refills | Status: DC
Start: 2017-11-22 — End: 2017-12-06

## 2017-11-22 NOTE — ED Notes (Signed)
Ambulated to xray with steady gait.

## 2017-11-22 NOTE — ED Notes (Signed)
Returned back from xray. 

## 2017-11-22 NOTE — Discharge Instructions (Signed)
1. Return immediately if worse in any way.    2. Follow up with your primary medical doctor for recheck.

## 2017-11-22 NOTE — ED Notes (Signed)
Discharged patient to home.  Ambulatory in no acute distress.  Prescriptions given and reviewed.  F/U information provided.  Home care for coughs/congestion given.  Patient aware and verbalized understanding.

## 2017-11-22 NOTE — ED Triage Notes (Signed)
Presents to the ED with c/o cough, congestion, and sore throat that began yesterday.  Reports some chills but no fever.

## 2017-11-22 NOTE — ED Provider Notes (Signed)
EMERGENCY DEPARTMENT NOTE    Physician/Midlevel provider first contact with patient: 11/22/17 0725         HISTORY OF PRESENT ILLNESS   Historian:Patient  Translator Used: No    Chief Complaint: Cough and Sore Throat       51 y.o. 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  No fever/chills.  No N/V/D.  No recent travel.    1. Location of symptoms: diffuse  2. Onset of symptoms: 1 day PTA  3. What was patient doing when symptoms started (Context): see above  4. Severity: moderate  5. Timing: constant  6. Activities that worsen symptoms: none  7. Activities that improve symptoms: none  8. Quality: ache  9. Radiation of symptoms: no  10. Associated signs and Symptoms: see above  11. Are symptoms worsening? yes  MEDICAL HISTORY     Past Medical History:  History reviewed. No pertinent past medical history.    Past Surgical History:  Past Surgical History:   Procedure Laterality Date   . SPLENECTOMY, TOTAL         Social History:  Social History     Social History   . Marital status: Single     Spouse name: N/A   . Number of children: N/A   . Years of education: N/A     Occupational History   . Not on file.     Social History Main Topics   . Smoking status: Former Smoker     Quit date: 10/22/2017   . Smokeless tobacco: Never Used   . Alcohol use Yes   . Drug use: No   . Sexual activity: Not on file     Other Topics Concern   . Not on file     Social History Narrative   . No narrative on file       Family History:  No family history on file.    Outpatient Medication:  Previous Medications    BUTALBITAL-ACETAMINOPHEN-CAFFEINE (FIORICET, ESGIC) 50-325-40 MG PER TABLET    Take 1 tablet by mouth every 4 (four) hours as needed for Headaches.    IBUPROFEN (ADVIL,MOTRIN) 800 MG TABLET    Take 1 tablet (800 mg total) by mouth every 8 (eight) hours as needed for Pain or Fever.         REVIEW OF SYSTEMS   Review of Systems   Constitutional:  Negative for fever.   HENT: Positive for congestion and sore throat. Negative for ear discharge, ear pain and hearing loss.    Eyes: Negative for blurred vision and double vision.   Respiratory: Negative for shortness of breath.    Cardiovascular: Negative for chest pain.   Gastrointestinal: Negative for abdominal pain, diarrhea, nausea and vomiting.   Musculoskeletal: Negative for back pain and neck pain.   Skin: Negative for rash.   All other systems reviewed and are negative.        PHYSICAL EXAM     ED Triage Vitals [11/22/17 0731]   Enc Vitals Group      BP 137/90      Heart Rate (!) 59      Resp Rate 18      Temp 97.7 F (36.5 C)      Temp Source Oral      SpO2 99 %      Weight 104.3 kg      Height 1.93 m  Head Circumference       Peak Flow       Pain Score 4      Pain Loc       Pain Edu?       Excl. in GC?    Nursing note and vitals reviewed.  Constitutional:  Well developed, well nourished.  Awake & Alert  Head:  Atraumatic.  Normocephalic.    Eyes:  Conjunctivae are not pale.  ENT:  Mucous membranes are moist and intact.  Oropharynx is clear and symmetric.  Patent airway. Pharynx is mildly injected.  Peritonsilar pilars are normal and symmetric.  Neck:  Supple.  No JVD. Full ROM.  No meningeal signs.  Cardiovascular:  Normal S1S2. Regular rhythm.  No murmurs.  Pulmonary/Chest:  No evidence of respiratory distress.  Scattered wheeze otherwise CTA b/l   Abdominal:  Soft and non-distended.  There is no tenderness.  No rebound, guarding, or rigidity.  No pulsatile masses.  Back:  No CVA ttp. No swelling or deformity.  Extremities:  No edema.   No cyanosis.  No clubbing. No calf tenderness. B/l LE symmetric.  2+ radial/DP/PT pulses b/l.  Skin:  Skin is warm and dry.  No diaphoresis. No rash.   Neurological: Normal speech.  Normal gate  Psychiatric:  Good eye contact.  Normal interaction, affect, and behavior    MEDICAL DECISION MAKING     DISCUSSION    CXR r/o infiltrate.  Given asplenia will cover with  z-pak and patient given precautions.  Decadron/albuterol for wheeze.  Will discharge home follow PCP return if worse.  Patient understands and agrees with plan.    Follow-up Information     [facility] In 3 days.    Contact information:  [address]  [place] [address]  [phone]                      Vital Signs: Reviewed the patient?s vital signs.   Nursing Notes: Reviewed and utilized available nursing notes.  Medical Records Reviewed: Reviewed available past medical records.  Counseling: The emergency provider has spoken with the patient and discussed today?s findings, in addition to providing specific details for the plan of care.  Questions are answered and there is agreement with the plan.        EMERGENCY IMAGING STUDIES    The following imagine studies were independently interpreted by me (emergency physician):    Radiology:  Interpreted by me (ED Physician)  Study: Chest Xray   Results: No infiltrate. No pneumothorax. No hemothorax. No cardiomegaly. No CHF.  Impression: No acute intrathoracic abnormality.    RADIOLOGY IMAGING STUDIES      Chest 2 Views   Final Result     No evidence of acute process.      Aquilla Hacker, MD    11/22/2017 8:14 AM            PULSE OXIMETRY    Oxygen Saturation by Pulse Oximetry: 99%  Interventions: none  Interpretation:  normal    EMERGENCY DEPT. MEDICATIONS      ED Medication Orders     Start Ordered     Status Ordering Provider    11/22/17 0745 11/22/17 0744  azithromycin (ZITHROMAX) tablet 500 mg  Once     Route: Oral  Ordered Dose: 500 mg     Last MAR action:  Given Adryanna Friedt D    11/22/17 0745 11/22/17 0744  dexamethasone (DECADRON) injection 10  mg  Once     Route: Oral  Ordered Dose: 10 mg     Last MAR action:  Given Cobe Viney D    11/22/17 0745 11/22/17 0744  albuterol (PROVENTIL HFA;VENTOLIN HFA) inhaler 2 puff  RT - Once     Route: Inhalation  Ordered Dose: 2 puff     Last MAR action:  Given  Perez Dirico D        CRITICAL CARE/PROCEDURES    Procedures    DIAGNOSIS      Diagnosis:  Final diagnoses:   Bronchospasm, acute   Viral syndrome       Disposition:  ED Disposition     ED Disposition Condition Date/Time Comment    Discharge  Sat Nov 22, 2017  8:22 AM [name] discharge to home/self care.    Condition at disposition: Stable          Prescriptions:  Patient's Medications   New Prescriptions    AZITHROMYCIN (ZITHROMAX) 250 MG TABLET    Take 1 tablet (250 mg total) by mouth daily.for 4 days    FLUTICASONE (FLONASE) 50 MCG/ACT NASAL SPRAY    2 sprays by Nasal route daily.for 14 days   Previous Medications    BUTALBITAL-ACETAMINOPHEN-CAFFEINE (FIORICET, ESGIC) 50-325-40 MG PER TABLET    Take 1 tablet by mouth every 4 (four) hours as needed for Headaches.    IBUPROFEN (ADVIL,MOTRIN) 800 MG TABLET    Take 1 tablet (800 mg total) by mouth every 8 (eight) hours as needed for Pain or Fever.   Modified Medications    No medications on file   Discontinued Medications    No medications on file        Shela Nevin, MD  11/22/17 [phone]

## 2017-11-24 ENCOUNTER — Ambulatory Visit
Admission: RE | Admit: 2017-11-24 | Discharge: 2017-11-24 | Disposition: A | Discharge status: Home / Self Care | Payer: PRIVATE HEALTH INSURANCE | Source: Ambulatory Visit | Attending: Family Medicine | Admitting: Family Medicine

## 2017-11-24 ENCOUNTER — Other Ambulatory Visit: Payer: Self-pay | Admitting: Family Medicine

## 2017-11-24 DIAGNOSIS — Z0271 Encounter for disability determination: Secondary | ICD-10-CM | POA: Insufficient documentation

## 2018-03-30 ENCOUNTER — Emergency Department
Admission: EM | Admit: 2018-03-30 | Discharge: 2018-03-30 | Disposition: A | Discharge status: Home / Self Care | Payer: 59 | Attending: Emergency Medicine | Admitting: Emergency Medicine

## 2018-03-30 ENCOUNTER — Emergency Department: Payer: 59

## 2018-03-30 DIAGNOSIS — H9203 Otalgia, bilateral: Secondary | ICD-10-CM | POA: Insufficient documentation

## 2018-03-30 DIAGNOSIS — F17200 Nicotine dependence, unspecified, uncomplicated: Secondary | ICD-10-CM | POA: Insufficient documentation

## 2018-03-30 DIAGNOSIS — Z638 Other specified problems related to primary support group: Secondary | ICD-10-CM | POA: Insufficient documentation

## 2018-03-30 DIAGNOSIS — R1013 Epigastric pain: Secondary | ICD-10-CM | POA: Insufficient documentation

## 2018-03-30 DIAGNOSIS — F439 Reaction to severe stress, unspecified: Secondary | ICD-10-CM

## 2018-03-30 DIAGNOSIS — J029 Acute pharyngitis, unspecified: Secondary | ICD-10-CM | POA: Insufficient documentation

## 2018-03-30 LAB — GROUP A STREP, RAPID ANTIGEN: Group A Strep, Rapid Antigen: NEGATIVE

## 2018-03-30 MED ORDER — LIDOCAINE VISCOUS 2 % MT SOLN
10.00 mL | Freq: Once | OROMUCOSAL | Status: AC
Start: 2018-03-30 — End: 2018-03-30
  Administered 2018-03-30: 10 mL via OROMUCOSAL
  Filled 2018-03-30: qty 15

## 2018-03-30 MED ORDER — FAMOTIDINE 20 MG PO TABS
20.00 mg | ORAL_TABLET | Freq: Once | ORAL | Status: AC
Start: 2018-03-30 — End: 2018-03-30
  Administered 2018-03-30: 20 mg via ORAL
  Filled 2018-03-30: qty 1

## 2018-03-30 MED ORDER — ALUM & MAG HYDROXIDE-SIMETH 200-200-20 MG/5ML PO SUSP
30.00 mL | Freq: Once | ORAL | Status: AC
Start: 2018-03-30 — End: 2018-03-30
  Administered 2018-03-30: 30 mL via ORAL
  Filled 2018-03-30: qty 30

## 2018-03-30 MED ORDER — FAMOTIDINE 20 MG PO TABS
20.00 mg | ORAL_TABLET | Freq: Two times a day (BID) | ORAL | 0 refills | Status: DC
Start: 2018-03-30 — End: 2021-11-05

## 2018-03-30 MED ORDER — DICYCLOMINE HCL 20 MG PO TABS
20.00 mg | ORAL_TABLET | Freq: Three times a day (TID) | ORAL | 0 refills | Status: DC | PRN
Start: 2018-03-30 — End: 2018-05-07

## 2018-03-30 NOTE — ED Provider Notes (Addendum)
EMERGENCY DEPARTMENT NOTE    Physician/Midlevel provider first contact with patient: 03/30/18 1540         HISTORY OF PRESENT ILLNESS   Historian:Patient  Translator Used: No    Chief Complaint: Abdominal Pain; Otalgia; Sore Throat; and Fatigue       52 y.o. male who presents with abdominal pain, sore throat and increased stress. Pt reports symptom onset was yesterday. Pt reports he has been under a lot of stress at home with his ex-wife and children. Reports binge drinking on empty stomach. Of note, pt reports abdominal discomfort, mostly on the left side. Reports some soreness in his throat with bilateral ear fullness. Increased weakness. No fevers or chills. Small BM last night.     1. Location of symptoms: LUQ  2. Onset of symptoms: yesterday   3. What was patient doing when symptoms started (Context): see above  4. Severity: moderate  5. Timing: constant   6. Activities that worsen symptoms: n/a  7. Activities that improve symptoms: n/a  8. Quality: aching   9. Radiation of symptoms: no  10. Associated signs and Symptoms: see above  11. Are symptoms worsening? yes  MEDICAL HISTORY     Past Medical History:  History reviewed. No pertinent past medical history.    Past Surgical History:  Past Surgical History:   Procedure Laterality Date   . SPLENECTOMY, TOTAL         Social History:  Social History     Social History   . Marital status: Single     Spouse name: N/A   . Number of children: N/A   . Years of education: N/A     Occupational History   . Not on file.     Social History Main Topics   . Smoking status: Current Every Day Smoker     Last attempt to quit: 10/22/2017   . Smokeless tobacco: Current User   . Alcohol use Yes   . Drug use: No   . Sexual activity: Not on file     Other Topics Concern   . Not on file     Social History Narrative   . No narrative on file       Family History:  No family history on file.    Outpatient Medication:  Discharge Medication List as of 03/30/2018  4:58 PM      CONTINUE these  medications which have NOT CHANGED    Details   butalbital-acetaminophen-caffeine (FIORICET, ESGIC) 50-325-40 MG per tablet Take 1 tablet by mouth every 4 (four) hours as needed for Headaches., Starting 08/17/2016, Until Discontinued, Normal      ibuprofen (ADVIL,MOTRIN) 800 MG tablet Take 1 tablet (800 mg total) by mouth every 8 (eight) hours as needed for Pain or Fever., Starting 08/17/2016, Until Discontinued, Normal               REVIEW OF SYSTEMS   Review of Systems  Constitutional: Negative for fever and chills.   HENT: Positive for bilateral ear pain and sore throat.  Eyes: Negative for eye discharge and eye redness.   Respiratory: Negative for cough and sputum production.    Gastrointestinal:  Positive for abdominal pain   Genitourinary: Negative for dysuria, urgency and frequency.   Neurological: Negative for dizziness, focal weakness, numbness.   All other systems negative.  PHYSICAL EXAM     ED Triage Vitals [03/30/18 1522]   Enc Vitals Group      BP 141/89  Heart Rate 70      Resp Rate 16      Temp 97.8 F (36.6 C)      Temp src       SpO2 98 %      Weight 103.4 kg      Height       Head Circumference       Peak Flow       Pain Score 8      Pain Loc       Pain Edu?       Excl. in GC?        Constitutional: Vital signs reviewed. Well appearing, well hydrated, well perfused, non-toxic appearing, no apparent distress  Head:  Normocephalic, atraumatic  Eyes: PERRL, normal conjunctiva bilaterally, EOMI  ENT: Normal posterior pharynx. No injection, pus, ulcerations or blisters.. Both TMs normal with normal landmarks and light reflex.  Neck: Normal range of motion. Non-tender.   Respiratory/Chest: clear to auscultation. No work of breathing. No tachypnea..  Cardiovascular: Regular rate and rhythm. No murmur.   Abdomen: tenderness in the left epigastric area . no guarding no rebound.  UpperExtremity: No edema or cyanosis.  Neurological: Awake and alert. No focal motor deficits by observation.  Skin: Warm and  dry. No rash.  Lymphatic: No cervical lymphadenopathy.    MEDICAL DECISION MAKING     52 y.o. male who presents with abdominal pain, sore throat and increased stress      x-ray neg for obstructive pathology   Given GI cocktail + pepcid with improvement in symptoms     Consider gastritis vs gastroenteritis vs IBS vs viral etiology   pepcid + bentyl rx provided   Outpatient GI and PCP follow-up provided         DISCUSSION        Vital Signs: Reviewed the patient?s vital signs.   Nursing Notes: Reviewed and utilized available nursing notes.  Medical Records Reviewed: Reviewed available past medical records.  Counseling: The emergency provider has spoken with the patient and discussed today?s findings, in addition to providing specific details for the plan of care.  Questions are answered and there is agreement with the plan.      CARDIAC STUDIES    The following cardiac studies were independently interpreted by the Emergency Medicine Physician.  For full cardiac study results please see chart.    Monitor Strip  Interpreted by ED Physician  Rate: 70  Rhythm: NSR   ST Changes: none    IMAGING STUDIES    The following imaging studies were independently interpreted by the Emergency Medicine Physician.  For full imaging study results please see chart.      PULSE OXIMETRY    Oxygen Saturation by Pulse Oximetry: 99%  Interventions: none  Interpretation:  Normal     EMERGENCY DEPT. MEDICATIONS      ED Medication Orders     Start Ordered     Status Ordering Provider    03/30/18 1540 03/30/18 1539  lidocaine viscous (XYLOCAINE) 2 % solution 10 mL  Once     Route: Mouth/Throat  Ordered Dose: 10 mL     Last MAR action:  Given ADJEI-TWUM, Achaia Garlock    03/30/18 1540 03/30/18 1539  alum & mag hydroxide-simethicone (MAALOX PLUS) 200-200-20 mg/5 mL suspension 30 mL  Once     Route: Oral  Ordered Dose: 30 mL     Last MAR action:  Given ADJEI-TWUM, Markeith Jue    03/30/18 1540 03/30/18 1539  famotidine (  PEPCID) tablet 20 mg  Once     Route: Oral   Ordered Dose: 20 mg     Last MAR action:  Given ADJEI-TWUM, Daziah Hesler          LABORATORY RESULTS    Ordered and independently interpreted AVAILABLE laboratory tests. Please see results section in chart for full details.  Results for orders placed or performed during the hospital encounter of 03/30/18   Rapid Strep   Result Value Ref Range    Group A Strep, Rapid Antigen Negative Negative           DIAGNOSIS      Diagnosis:  Final diagnoses:   Epigastric pain   Otalgia of both ears   Sore throat   Stress at home       Disposition:  ED Disposition     ED Disposition Condition Date/Time Comment    Discharge  Mon Mar 30, 2018  5:07 PM [name] discharge to home/self care.    Condition at disposition: Stable          Prescriptions:  Discharge Medication List as of 03/30/2018  4:58 PM      START taking these medications    Details   dicyclomine (BENTYL) 20 MG tablet Take 1 tablet (20 mg total) by mouth every 8 (eight) hours as needed (as needed for cramping), Starting Mon 03/30/2018, Print      famotidine (PEPCID) 20 MG tablet Take 1 tablet (20 mg total) by mouth 2 (two) times daily, Starting Mon 03/30/2018, Print         CONTINUE these medications which have NOT CHANGED    Details   butalbital-acetaminophen-caffeine (FIORICET, ESGIC) 50-325-40 MG per tablet Take 1 tablet by mouth every 4 (four) hours as needed for Headaches., Starting 08/17/2016, Until Discontinued, Normal      ibuprofen (ADVIL,MOTRIN) 800 MG tablet Take 1 tablet (800 mg total) by mouth every 8 (eight) hours as needed for Pain or Fever., Starting 08/17/2016, Until Discontinued, Normal                  Adjei-Twum, [employer]key [employer], MD  03/30/18 2108       Rulon Abide, MD  03/30/18 2108

## 2018-03-30 NOTE — ED Triage Notes (Signed)
Abdominal spasms onset yesterday. Ears pain, sore throat onset today. Lethargy. No F/V/D. Last BM today. Going under a lot of stress at home and work. NAD

## 2018-03-30 NOTE — Discharge Instructions (Signed)
Epigastric Abdominal Pain (Cause Unspecified)    You were treated for epigastric abdominal (belly) pain. We don't know the cause of the pain yet.    Epigastric pain is located in the center of your upper belly right under your ribs.     There are several common causes of epigastric abdominal pain. These may include:     Gastroesophageal reflux disease (GERD) or heartburn: This is the most common cause. It usually happens right after eating.      Gastritis: This is inflammation of your stomach.     Lactose Intolerance: This means the body can't digest lactose. This is found in milk and other dairy products.     Pancreatitis: This is when your pancreas gets inflamed. Often the pain can be felt in the back.     Ulcers in your stomach or intestine.    Your doctor diagnosed your condition by your physical exam and your history. You may have also had imaging or blood work done to make sure you don't have any serious problems.     Treatment of epigastric abdominal pain focuses on making symptoms better. Fluids and electrolytes (sodium and potassium) may be given through an IV. Pain medications may be given.     Your doctor may give you something to lower acid production or coat the stomach lining.    Acid Reducing Medications - These medications lower the amount of acid made in your stomach. This helps the inflammation in the stomach lining heal. Scheduling and dosing can vary, so follow the instructions carefully. Two of the common types of antacid medications are H2 blockers and proton pump inhibitors (PPIs). They include:    - H2 famotidine (Pepcid)  - H2 ranitidine (Zantac)  - PPI omeprazole (Prilosec)  - PPI lansoprazole (Prevacid)  - PPI esomeprazole (Nexium)  - PPI pantoprazole (Protonix)    Acid protective medications - These stick to damaged ulcer tissue and protect against acid and enzymes. This way, the ulcer can heal.     - Sucralafate (Carafate).    You have been evaluated, treated,  and observed by your doctor.Your doctor feels thatyour condition has stabilized and it is safe for you to go home.    Though we don't believe your condition is dangerous right now, it is important to be careful. Sometimes a problem that seems mild can become serious later. This is why it is very important that you return here or go to the nearest Emergency Department if you are not improving or your symptoms are getting worse.    Your doctor may have you follow up with your primary care doctor as an outpatient to check your condition. Follow up as directed.    Your doctor may prescribe you pain medications to treat yourpain. You can use over-the-counter medicines like acetaminophen (Tylenol). It is important to follow the directions for taking these medications.    Stool softeners - These medications help with the constipation caused by narcotic medications. You can use over-the-counter laxatives like milk of magnesia or magnesium citrate.    YOU SHOULD SEEK MEDICAL ATTENTION IMMEDIATELY, EITHER HERE OR AT THE NEAREST EMERGENCY DEPARTMENT, IF ANY OF THE FOLLOWING OCCUR:   You have sudden severe pain in your belly or chest.   Your pain gets worse or does not go away.   You throw up blood or see blood in your stool. Blood may be bright red or dark black and tarry.   Your skin or eyes look yellow or your   urine (pee) looks dark brown.   You get a fever (temperature higher than 100.4F or 38C) or shaking chills.     If you can't follow up with your doctor, or if at any time you feel you need to be rechecked or seen again, come back here or go to the nearest emergency department.

## 2018-05-07 ENCOUNTER — Ambulatory Visit: Payer: 59 | Admitting: Anesthesiology

## 2018-05-07 ENCOUNTER — Encounter
Admission: RE | Disposition: A | Discharge status: Home / Self Care | Payer: Self-pay | Source: Ambulatory Visit | Attending: Gastroenterology

## 2018-05-07 ENCOUNTER — Ambulatory Visit
Admission: RE | Admit: 2018-05-07 | Discharge: 2018-05-07 | Disposition: A | Discharge status: Home / Self Care | Payer: 59 | Source: Ambulatory Visit | Attending: Gastroenterology | Admitting: Gastroenterology

## 2018-05-07 HISTORY — DX: Depression, unspecified: F32.A

## 2018-05-07 HISTORY — DX: Claustrophobia: F40.240

## 2018-05-07 HISTORY — DX: Unspecified disorder of ear, unspecified ear: H93.90

## 2018-05-07 HISTORY — DX: Pneumonia, unspecified organism: J18.9

## 2018-05-07 HISTORY — DX: Low back pain, unspecified: M54.50

## 2018-05-07 HISTORY — DX: Unspecified osteoarthritis, unspecified site: M19.90

## 2018-05-07 HISTORY — DX: Gastric ulcer, unspecified as acute or chronic, without hemorrhage or perforation: K25.9

## 2018-05-07 HISTORY — DX: Unspecified disorder of nose and nasal sinuses: J34.9

## 2018-05-07 HISTORY — DX: Hypoglycemia, unspecified: E16.2

## 2018-05-07 HISTORY — DX: Gastro-esophageal reflux disease without esophagitis: K21.9

## 2018-05-07 HISTORY — DX: Dysphagia, unspecified: R13.10

## 2018-05-07 HISTORY — DX: Headache, unspecified: R51.9

## 2018-05-07 HISTORY — DX: Other diseases of pharynx: J39.2

## 2018-05-07 LAB — GLUCOSE WHOLE BLOOD - POCT: Whole Blood Glucose POCT: 86 mg/dL (ref 70–100)

## 2018-05-07 SURGERY — ESOPHAGOGASTRODUODENOSCOPY (EGD), BIOPSY
Anesthesia: Anesthesia General | Site: Abdomen | Wound class: Clean Contaminated

## 2018-05-07 MED ORDER — GLYCOPYRROLATE 0.2 MG/ML IJ SOLN
INTRAMUSCULAR | Status: AC
Start: 2018-05-07 — End: ?
  Filled 2018-05-07: qty 1

## 2018-05-07 MED ORDER — LIDOCAINE HCL (PF) 2 % IJ SOLN
INTRAMUSCULAR | Status: AC
Start: 2018-05-07 — End: ?
  Filled 2018-05-07: qty 5

## 2018-05-07 MED ORDER — PROPOFOL 10 MG/ML IV EMUL (WRAP)
INTRAVENOUS | Status: AC
Start: 2018-05-07 — End: ?
  Filled 2018-05-07: qty 50

## 2018-05-07 SURGICAL SUPPLY — 70 items
BALLOON CRE DILTR 12-15MMX8CM (Balloons)
CANISTER 1000CC (Procedure Accessories) ×2 IMPLANT
CATH BLN DIL 5.5FRX240CMX6-8MM (Balloons)
CATH BLN DIL BOSCI 15-18MMX8CM (Balloons)
CATH CRE 6F 10-12 8X180CM (Balloons)
CATH CRE 6F 18-20 8X180CM (Balloons)
CATH CRE 7.5F 18-20 5.5X240CM (Balloons)
CATHETER BALLOON DILATATION CRE 2.8 MM (Balloons)
CATHETER BALLOON DILATATION CRE PEBAX (Balloons)
CATHETER BALLOON DILATATION L5.5 CM L240 (Balloons)
CATHETER OD10-11-12 MM ODSEC6 FR L180 CM CREâ„¢ BALLOON DILATATION L8 CM (Balloons) IMPLANT
CATHETER OD15-16.5-18 MM ODSEC6 FR L180 CM CREâ„¢ BALLOON DILATATION L8 (Balloons) IMPLANT
CATHETER OD18-19-20 MM ODSEC6 FR L180 CM CREâ„¢ BALLOON DILATATION L8 CM (Balloons) IMPLANT
CATHETER OD6 FR ODSEC12-13.5-15 MM L180 CM CREâ„¢ BALLOON DILATATION L8 (Balloons) IMPLANT
CATHETER OD6-7-8 MM ODSEC6 FR L180 CM CREâ„¢ BALLOON DILATATION L8 CM (Balloons) IMPLANT
CATHETER OD7.5 FR ODSEC12-15 MM L240 CM CREâ„¢ BALLOON DILATATION L5.5 (Balloons) IMPLANT
CATHETER OD7.5 FR ODSEC15-18 MM L240 CM CREâ„¢ BALLOON DILATATION L5.5 (Balloons) IMPLANT
CATHETER OD8-9-10 MM ODSEC6 FR L180 CM CREâ„¢ BALLOON DILATATION L8 CM (Balloons) IMPLANT
CATHETER OD8-9-10 MM ODSEC7.5 FR L240 CM CREâ„¢ BALLOON DILATATION L5.5 (Balloons) IMPLANT
DILATOR ENDOSCOPIC CRE 2.8 MM 3.2 MM (Balloons)
DILATOR ENDOSCOPIC CRE 2.8 MM 3.2 MM PEBAX ESOPHAGEAL PYLORIC COLONIC (Balloons) IMPLANT
DILATOR ENDOSCOPIC CRE 5.5C 240CM 18-19-20MM 7.5FR PEBAX ESOPHAGEAL (Balloons) IMPLANT
DILATOR ENDOSCOPIC CRE 5.5C 240CM 6-7-8MM 7.5FR PEBAX ESOPHAGEAL (Balloons) IMPLANT
DILATOR ENDOSCOPIC CRE PEBAX ESOPHAGEAL (Balloons)
DILATOR WREGDE  BLLN 10-12MM (Balloons)
DILATOR WREGDE  BLLN 12-15MM (Balloons)
DILATOR WREGDE  BLLN 15-18MM (Balloons)
DILATOR WREGDE  BLLN 6-8MM (Balloons)
DILATOR WREGDE  BLLN 8-10MM (Balloons)
FORCEP HOT BIOPSY RJ4 (Endoscopic Supplies)
FORCEPS BIOPSY L240 CM +2.8 MM HOT OD2.2 (Endoscopic Supplies)
FORCEPS BIOPSY L240 CM +2.8 MM HOT OD2.2 MM RADIAL JAW (Endoscopic Supplies) IMPLANT
FORCEPS BIOPSY L240 CM MICROMESH TEETH STREAMLINE CATHETER NEEDLE (Instrument) IMPLANT
FORCEPS BIOPSY L240 CM STANDARD CAPACITY (Instrument)
FORCEPS RADIAL JAW 4 2.8MM (Instrument)
GAUZE SPONGE 4X4 NS (Dressing) ×10
GOWN ISO YELLOW UNIVERSAL (Gown) ×4 IMPLANT
JELLY LUB EZ LF STRL H2O SOL NGRS TRNLU (Irrigation Solutions) ×1 IMPLANT
JELLY LUBE STRL FLPTOP 4OZ (Irrigation Solutions) ×1
KIT CARRY ON ENDO PROCEDURE (Kits) ×2
KIT CARRY ON ENDO PROCEDURE CPK4557000001 (Kits) ×1 IMPLANT
KIT UNIVERSAL IRRIGATION SOL (Kits) ×2 IMPLANT
NEEDLE CARR-LOCKE INJECT 25GX5 (Needles) IMPLANT
PAD ELECTROSRG GRND REM W CRD (Procedure Accessories) IMPLANT
PROBE COAGULATION L7.2 FT (Endoscopic Supplies)
PROBE COAGULATION L7.2 FT CIRCUMFERENTIAL PLUG PLAY FUNCTIONALITY (Endoscopic Supplies) IMPLANT
PROBE ELECTROSURGICAL L220 CM FLEXIBLE (Procedure Accessories)
PROBE ELECTROSURGICAL L220 CM FLEXIBLE STRAIGHT FIRE OD2.3 MM FIAPC (Procedure Accessories) IMPLANT
PROBE FIAPC CIRC 220MM (Endoscopic Supplies)
PROBE FIAPC STRGHT FIRE 220CM (Procedure Accessories)
RESUSCITATOR ADULT MANUAL 40 (Respiratory Supplies)
RESUSCITATOR MANUAL ADULT MASK 2 SWIVEL ELBOW CORRUGATE O2 TUBE SELF (Respiratory Supplies) IMPLANT
RESUSCITATOR MNL ARLF 40IN MSK 2 SWVL (Respiratory Supplies)
SNARE CAPTIVATOR 13MMX240CM (GE Lab Supplies)
SNARE ESCP MIC CPTVTR 13MM 240IN STRL (GE Lab Supplies)
SNARE SMALL HEXAGON CAPTIVATOR STIFF ENDOSCOPIC POLYPECTOMY (GE Lab Supplies) IMPLANT
SNARE THROW SENS SHRT STD OVA (Endoscopic Supplies) IMPLANT
SOFT-CUF 2T ADULT SUB-MIN (Cuff) ×2 IMPLANT
SOL WATER STERILE 1000CC BTLE (Irrigation Solutions) ×1
SPONGE GAUZE L4 IN X W4 IN 16 PLY (Dressing) ×10
SPONGE GAUZE L4 IN X W4 IN 16 PLY MAXIMUM ABSORBENT USP TYPE VII (Dressing) ×10 IMPLANT
SYRINGE 50 ML GRADUATE NONPYROGENIC DEHP (Syringes, Needles) ×1
SYRINGE 50 ML GRADUATE NONPYROGENIC DEHP FREE PVC FREE BD MEDICAL (Syringes, Needles) ×1 IMPLANT
SYRINGE INFLATION 60 ML GAUGE CRE (Syringes, Needles) IMPLANT
SYRINGE INFLATION GAUGE 60CC (Syringes, Needles)
SYRINGE SLIP-TIP 60CC (Syringes, Needles) ×1
WATER STERILE PLASTIC POUR BOTTLE 1000 (Irrigation Solutions) ×1
WATER STERILE PLASTIC POUR BOTTLE 1000 ML (Irrigation Solutions) ×1 IMPLANT
WATER STERILE PLASTIC POUR BOTTLE 250 ML (Irrigation Solutions) ×1 IMPLANT
WATER STRL IRRIG 250ML BTL (Irrigation Solutions) ×1

## 2018-05-07 NOTE — OR Nursing (Signed)
Pt ate 3 jelly beans at 0830. Procedure cancelled per MD.

## 2018-05-07 NOTE — Anesthesia Preprocedure Evaluation (Signed)
Anesthesia Evaluation    AIRWAY    Mallampati: I    TM distance: >3 FB  Neck ROM: full  Mouth Opening:full  Planned to use difficult airway equipment: No CARDIOVASCULAR    regular and normal       DENTAL         PULMONARY    clear to auscultation     OTHER FINDINGS                  Relevant Problems   No relevant active problems               Anesthesia Plan    ASA 2     general                                 informed consent obtained                   Signed by: Alvan Dame 05/07/18 11:12 AM

## 2018-05-13 ENCOUNTER — Ambulatory Visit: Payer: 59 | Admitting: Anesthesiology

## 2018-05-13 ENCOUNTER — Ambulatory Visit: Payer: Self-pay

## 2018-05-13 ENCOUNTER — Encounter
Admission: RE | Disposition: A | Discharge status: Home / Self Care | Payer: Self-pay | Source: Ambulatory Visit | Attending: Gastroenterology

## 2018-05-13 ENCOUNTER — Ambulatory Visit
Admission: RE | Admit: 2018-05-13 | Discharge: 2018-05-13 | Disposition: A | Discharge status: Home / Self Care | Payer: 59 | Source: Ambulatory Visit | Attending: Gastroenterology | Admitting: Gastroenterology

## 2018-05-13 DIAGNOSIS — D120 Benign neoplasm of cecum: Secondary | ICD-10-CM | POA: Insufficient documentation

## 2018-05-13 DIAGNOSIS — K2980 Duodenitis without bleeding: Secondary | ICD-10-CM | POA: Insufficient documentation

## 2018-05-13 DIAGNOSIS — D122 Benign neoplasm of ascending colon: Secondary | ICD-10-CM | POA: Insufficient documentation

## 2018-05-13 DIAGNOSIS — K319 Disease of stomach and duodenum, unspecified: Secondary | ICD-10-CM | POA: Insufficient documentation

## 2018-05-13 DIAGNOSIS — K210 Gastro-esophageal reflux disease with esophagitis: Secondary | ICD-10-CM | POA: Insufficient documentation

## 2018-05-13 DIAGNOSIS — K648 Other hemorrhoids: Secondary | ICD-10-CM | POA: Insufficient documentation

## 2018-05-13 DIAGNOSIS — Z1211 Encounter for screening for malignant neoplasm of colon: Secondary | ICD-10-CM | POA: Insufficient documentation

## 2018-05-13 DIAGNOSIS — K5730 Diverticulosis of large intestine without perforation or abscess without bleeding: Secondary | ICD-10-CM | POA: Insufficient documentation

## 2018-05-13 DIAGNOSIS — K621 Rectal polyp: Secondary | ICD-10-CM | POA: Insufficient documentation

## 2018-05-13 DIAGNOSIS — R1013 Epigastric pain: Secondary | ICD-10-CM | POA: Insufficient documentation

## 2018-05-13 DIAGNOSIS — K209 Esophagitis, unspecified: Secondary | ICD-10-CM

## 2018-05-13 DIAGNOSIS — K219 Gastro-esophageal reflux disease without esophagitis: Secondary | ICD-10-CM

## 2018-05-13 DIAGNOSIS — R1310 Dysphagia, unspecified: Secondary | ICD-10-CM | POA: Insufficient documentation

## 2018-05-13 DIAGNOSIS — K5790 Diverticulosis of intestine, part unspecified, without perforation or abscess without bleeding: Secondary | ICD-10-CM

## 2018-05-13 HISTORY — PX: EGD, COLONOSCOPY: SHX3799

## 2018-05-13 SURGERY — EGD, COLONOSCOPY
Anesthesia: Anesthesia General | Wound class: Clean Contaminated

## 2018-05-13 MED ORDER — EPHEDRINE SULFATE 50 MG/ML IJ/IV SOLN (WRAP)
Status: DC | PRN
Start: 2018-05-13 — End: 2018-05-13
  Administered 2018-05-13 (×4): 10 mg via INTRAVENOUS

## 2018-05-13 MED ORDER — PHENYLEPHRINE 100 MCG/ML IN NACL 0.9% IV SOSY
PREFILLED_SYRINGE | INTRAVENOUS | Status: DC | PRN
Start: 2018-05-13 — End: 2018-05-13
  Administered 2018-05-13 (×3): 50 ug via INTRAVENOUS

## 2018-05-13 MED ORDER — LIDOCAINE HCL 2 % IJ SOLN
INTRAMUSCULAR | Status: DC | PRN
Start: 2018-05-13 — End: 2018-05-13
  Administered 2018-05-13: 50 mg

## 2018-05-13 MED ORDER — EPHEDRINE SULFATE 50 MG/ML IJ/IV SOLN (WRAP)
Status: AC
Start: 2018-05-13 — End: ?
  Filled 2018-05-13: qty 1

## 2018-05-13 MED ORDER — PROPOFOL INFUSION 10 MG/ML
INTRAVENOUS | Status: DC | PRN
Start: 2018-05-13 — End: 2018-05-13
  Administered 2018-05-13: 160 ug/kg/min via INTRAVENOUS

## 2018-05-13 MED ORDER — PROPOFOL 10 MG/ML IV EMUL (WRAP)
INTRAVENOUS | Status: AC
Start: 2018-05-13 — End: ?
  Filled 2018-05-13: qty 20

## 2018-05-13 MED ORDER — FENTANYL CITRATE (PF) 50 MCG/ML IJ SOLN (WRAP)
INTRAMUSCULAR | Status: AC
Start: 2018-05-13 — End: ?
  Filled 2018-05-13: qty 2

## 2018-05-13 MED ORDER — PROPOFOL 10 MG/ML IV EMUL (WRAP)
INTRAVENOUS | Status: AC
Start: 2018-05-13 — End: ?
  Filled 2018-05-13: qty 50

## 2018-05-13 MED ORDER — LACTATED RINGERS IV SOLN
INTRAVENOUS | Status: DC | PRN
Start: 2018-05-13 — End: 2018-05-13

## 2018-05-13 MED ORDER — PROPOFOL 10 MG/ML IV EMUL (WRAP)
INTRAVENOUS | Status: DC | PRN
Start: 2018-05-13 — End: 2018-05-13
  Administered 2018-05-13 (×2): 20 mg via INTRAVENOUS
  Administered 2018-05-13: 80 mg via INTRAVENOUS
  Administered 2018-05-13: 20 mg via INTRAVENOUS
  Administered 2018-05-13: 10 mg via INTRAVENOUS

## 2018-05-13 MED ORDER — FENTANYL CITRATE (PF) 50 MCG/ML IJ SOLN (WRAP)
INTRAMUSCULAR | Status: DC | PRN
Start: 2018-05-13 — End: 2018-05-13
  Administered 2018-05-13: 50 ug via INTRAVENOUS

## 2018-05-13 SURGICAL SUPPLY — 23 items
CANISTER 1000CC (Procedure Accessories) ×2 IMPLANT
FORCEPS BIOPSY L240 CM MICROMESH TEETH STREAMLINE CATHETER NEEDLE (Instrument) IMPLANT
FORCEPS BIOPSY L240 CM STANDARD CAPACITY (Instrument) ×1
FORCEPS RADIAL JAW 4 2.8MM (Instrument) ×1
GAUZE SPONGE 4X4 NS (Dressing) ×10
GOWN ISO YELLOW UNIVERSAL (Gown) ×4 IMPLANT
JELLY LUB EZ LF STRL H2O SOL NGRS TRNLU (Irrigation Solutions) ×1 IMPLANT
JELLY LUBE STRL FLPTOP 4OZ (Irrigation Solutions) ×1
KIT UNIVERSAL IRRIGATION SOL (Kits) ×2 IMPLANT
SNARE 9 MM L230 CM OD2.4 MM COLD BRAID (Instrument) ×1
SNARE 9 MM L230 CM OD2.4 MM EXACTO COLD BRAID WIRE CLEAN CUT (Instrument) IMPLANT
SNARE EXACTO COLD 2.4MMX230CM (Instrument) ×1
SOFT-CUF 2T ADULT SUB-MIN (Cuff) ×2 IMPLANT
SOL WATER STERILE 1000CC BTLE (Irrigation Solutions) ×1
SPONGE GAUZE L4 IN X W4 IN 16 PLY (Dressing) ×10
SPONGE GAUZE L4 IN X W4 IN 16 PLY MAXIMUM ABSORBENT USP TYPE VII (Dressing) ×10 IMPLANT
SYRINGE 50 ML GRADUATE NONPYROGENIC DEHP (Syringes, Needles) ×1
SYRINGE 50 ML GRADUATE NONPYROGENIC DEHP FREE PVC FREE BD MEDICAL (Syringes, Needles) ×1 IMPLANT
SYRINGE SLIP-TIP 60CC (Syringes, Needles) ×1
WATER STERILE PLASTIC POUR BOTTLE 1000 (Irrigation Solutions) ×1
WATER STERILE PLASTIC POUR BOTTLE 1000 ML (Irrigation Solutions) ×1 IMPLANT
WATER STERILE PLASTIC POUR BOTTLE 250 ML (Irrigation Solutions) ×1 IMPLANT
WATER STRL IRRIG 250ML BTL (Irrigation Solutions) ×1

## 2018-05-13 NOTE — OR Nursing (Signed)
Patient placed bite block in mouth without difficulty.

## 2018-05-13 NOTE — Discharge Instr - AVS First Page (Signed)
Endoscopy Discharge Instructions  General Instructions:  1. Following sedation, your judgement, perception, and coordination are considered impaired. Even though you may feel awake and alert, you are considered legally intoxicated. Therefore, until the next morning;   Do not Drive   Do not operate appliances or equipment that requires reaction time (e.g.    Stove, electrical tools, machinery)   Do not sign legal documents or be involved in important decisions.   Do not smoke if alone   Do not drink alcoholic beverages   Go directly home and rest for several hours before resuming your routine    activities.   It is highly recommended to have a responsible adult stay with you for the   next 24 hours    2. Tenderness, swelling or pain may occur at the IV site where you received sedation. If you experience this, apply warm soaks to the area. Notify your physician if this persists.    Instructions Specific To Procedures - Report To Physician Any Of The Following:    Upper Endoscopy, ERCP, Dilations   1. Pain in Chest   2. Nausea/vomitting   3. Fevers/Chills within 24 hours after procedure. Temp>101deg F   4. Severe and persistent abdominal pain and bloating     In Addition:   Mild throat soreness may follow this procedure. Warm salt water gargling or   lozenges of your choice will most likely relieve your discomfort or cold drinks and   popsicles.     Colon/Sigmoidoscopy/Proctoscopy   1. Severe and persistent abdominal pain/bloating which does not subside within 2-3 hours   2. Large amount of rectal bleeding (some mucosal blood streaking may occur, especially if biopsy or polypectomy was done or if hemorrhoids are present.   3. Nausea/vomitting   4. Fevers/Chills within 24 hours after procedure. Temp>101deg F     In Addition:\     If polyp has been removed, DO NOT take aspirin or aspirin containing products   (e.g. Anacin, Alka Seltzer, Bufferin, Etc.) or non-steroidal anti-inflammatory drugs  (e.g. Advil, Motrin,  etc.) for 14 days unless otherwise advised otherwise on your Patient Procedure Report and Recommendations. Tylenol or extra Strength Tylenol is permitted.        Additional Discharge Instructions  Your diet after the procedure: No restriction.  Special Instructions: See Procedure Patient Report Recommendations    If biopsies were taken, call Dr. Laaibah Wartman at [phone] in one week for results    Patient education literature given; Yes      If you have questions or problems contact your MD immediately. If you need immediate attention, call your MD, 911 and/or go to nearest emergency room.

## 2018-05-13 NOTE — OR Nursing (Signed)
Patient pushed bite block out of mouth with tongue without difficulty.

## 2018-05-13 NOTE — Discharge Instructions (Signed)
Diverticulosis    Diverticulosismeans that small pouches have formed in the wall ofyourlarge intestine (colon). Most often, this problem causes no symptoms and is common as people age. But the pouches in the colon are at risk of becoming infected. When this happens, the condition is called diverticulitis. Although most people with diverticulosis never develop diverticulitis, it is still not uncommon. Rectal bleeding can also occur and in less common situations, a type of colon inflammation called colitis.  While most people don'thave symptoms, some people with diverticulosis mayhave:   Abdominal cramps and pain   Bloating   Constipation   Change in bowel habits  Causes  The exact cause of diverticulosis (and diverticulitis) has not been proved, buta few things are associated with the condition:   Low-fiber diet   Constipation   Lack of exercise  Your healthcare provider will talk with you about how to manage your condition. Diet changes may be all that are needed to help control diverticulosis and prevent progression to diverticulitis. If you develop diverticulitis, you will likely needother treatments.  Home care  You may be told to take fiber supplements daily. Fiber adds bulk to the stool so that it passes through the colon more easily. Stool softeners may also be recommended. You may also be given medicines for pain relief. Be sure to take all medicines as directed.  In the past, people were told to avoid corn, nuts, and seeds. This is no longer necessary.  Follow these guidelines when caring for yourself at home:   Eat unprocessed foods that are high in fiber. Whole-grains, fruits, and vegetables are good choices.   Drink 6 to 8 glasses of water every day unless your healthcare provider has you limit how muchfluid you should have.   Watch for changes in your bowel movements. Tell your provider if you notice any changes.   Begin an exercise program. Ask your provider how to get started.  Generally, walking is the best.   Get plenty of rest and sleep.  Follow-up care  Follow up with your healthcare provider, oras advised. Regular visits may be needed to check on your health. Sometimes special procedures such as colonoscopy, are needed after an episode of diverticulitis or blooding. Be sure to keep all your appointments.  If a stool sample was taken, or cultures were done, you should be told if they are positive, or if your treatment needs to be changed. You can call as directed for the results.  If X-rays were done,a radiologist will look at them. You will be told if there is a change in your treatment.  If antibiotics were prescribed, be sure to finish them all.  When to seek medical advice  Call your healthcare provider right awayif any of these occur:   Fever of 100.4F (38C) or higher, or as directed by your healthcare provider   Severe cramps in the lower left side of the abdomen or pain that is gettingworse   Tenderness in the lower left side of the abdomen or worsening pain throughout the abdomen   Diarrhea or constipation that doesn't get better within 24 hours   Nausea and vomiting   Bleeding from the rectum  Call 911  Call 911 if any of the following occur:   Trouble breathing   Confusion   Very drowsy or trouble awakening   Fainting or loss of consciousness   Rapid heart rate   Chest pain  Date Last Reviewed: 05/30/2017   2000-2018 [employer]   [address], [place], [place] [address]. All rights reserved. This information is not intended as a substitute for professional medical care. Always follow your healthcare professional's instructions.        Understanding Colon and Rectal Polyps    The colon (also called the large intestine) is a muscular tube that forms the last part of the digestive tract. It absorbs water and stores food waste. The colon is about 4 to 6 feet long. The rectum is the last 6 inches of the colon. The colon and rectum have a smooth  lining composed of millions of cells. Changes in these cells can lead to growths in the colon that can become cancerous and should be removed. Multiple tests are available to screen for colon cancer, but the colonoscopy is the most recommended test. During colonoscopy, these polyps can be removed. How often you need this test depends on many things including your condition, your family history, symptoms, and what the findings were at the previous colonoscopy.  When the colon lining changes  Changes that happen in the cells that line the colon or rectum can lead to growths called polyps. Over a period of years, polyps can turn cancerous. Removing polyps early may prevent cancer from ever forming.  Polyps  Polyps are fleshy clumps of tissue that form on the lining of the colon or rectum. Small polyps are usually benign (not cancerous). However, over time, cells in a polyp can change and become cancerous. Certain types of polyps known as adenomatous polyps are premalignant. The risk for invasive cancer increases with the size of the polyp and certain cell and gene features. This means that they can become cancerous if they're not removed.Hyperplastic polyps are benign. They can grow quite large and not turn cancerous.  Cancer  Almost all colorectal cancers start when polyp cells begin growing abnormally. As a cancerous tumor grows, it may involve more and more of the colon or rectum. In time, cancer can also grow beyond the colon or rectum and spread to nearby organs or to glands called lymph nodes. The cells can also travel to other parts of the body. This is known as metastasis. The earlier a cancerous tumor is removed, the better the chance of preventing its spread.    Date Last Reviewed: 07/31/2015   2000-2018 [employer], [place]. [address] [place], [place] [address]. All rights reserved. This information is not intended as a substitute for professional medical care. Always follow your healthcare  professional's instructions.        Gastritis (Adult)    Gastritis isinflammation andirritation of the stomach lining. You can have it for a short time (acute) or be long lasting (chronic). Infection with bacteria calledH pylori most often causes gastritis.More than a third of people in the [place] have these bacteria in their bodies. In many cases,H pyloricauses no problems or symptoms. In some people, though, the infection irritates the stomach lining and causes gastritis. H. pylori may be diagnosed through blood, stool, or breath tests, we well as through biopsy during an endoscopy. Other causes of stomach irritation include drinking alcohol, smoking or chewing tobacco, or taking pain-relieving medicines called NSAIDs (such as aspirin or ibuprofen). Certain drugs (such as cocaine) and immune conditions can also cause gastritis.  Symptoms of gastritis can include:   Belly pain or bloating   Feeling full quickly   Loss of appetite   Nausea or vomiting   Vomiting blood or having black stools   Feeling more  tired than usual  An inflamed and irritated stomach lining is more likely to develop a sore called an ulcer. To help prevent this, gastritis should be treated.  Home care  If needed, our healthcare provider may prescribe medicines. If you haveH pyloriinfection, treating it will likely relieve your symptoms. Other changes can help reduce stomach irritation and help it heal.   If you have been prescribed medicines forH pyloriinfection, take them as directed. Take all of the medicine until it is finished or your healthcare provider tells you to stop, even if you feel better.   Your healthcare provider may advise you not to take NSAIDs. If you take daily aspirin for your heart or other medical reasons, do not stop without talking to your healthcare provider first.   Don't drink alcohol.   Stop smoking. Smoking can irritate the stomach and delay healing. As much as possible, stay away from second hand  smoke.  Follow-up care  Follow up with your healthcare provider, or as advised by our staff. You may need testing to check for inflammation or an ulcer.  When to seek medical advice  Call your healthcare provider for any of the following:   Stomach pain that gets worse or moves to the lower right belly (appendix area)   Chest pain that appears or gets worse, or spreads to the back, neck, shoulder, or arm   Frequent vomiting (can't keep down liquids)   Blood in the stool or vomit (red or black in color)   Feeling weak or dizzy   Shortness of breath   Unexplained weight loss   Fever of 100.12F (38C) or higher, or as directed by your healthcare provider  Date Last Reviewed: 02/27/2017   2000-2018 [employer], [place]. [address], [place], [place] [address]. All rights reserved. This information is not intended as a substitute for professional medical care. Always follow your healthcare professional's instructions.        Epigastric Pain (Uncertain Cause)  Epigastric pain is pain in the upper abdomen. It can be a sign of disease. Common causes include:   Acid reflux (stomach acid flowing up into the esophagus)   Gastritis (irritation of the stomach lining) Most often this is from aspirin or NSAID medicines such as ibuprofen, bacteria called H. pylori, or frequent alcohol use.   Peptic ulcer disease   Inflammation of the pancreas   Gallstone   Infection in the gallbladder  Pain may be dull or burning. It may spread upward to the chest or to the back. There may be other symptoms such as belching, bloating, cramps or hunger pains. There may be weight loss or poor appetite, nausea or vomiting.  Since the cause of your pain is not certain yet,you may need more tests. Sometimes the doctor will treat you for the most likely condition to see if there is improvement before doing more tests.  Home care  Medicines   Antacids help neutralize the normal acids in your stomach.If you don't like the liquid, you  can try a chewable one. You may find one works better than another for you. Overuse can cause diarrhea or constipation.   Acid blockers (H2 blockers) decrease acid production. Examples are cimetidine, famotidine, and ranitidine.   Acid inhibitors (PPIs) decrease acid production in a different way than the blockers. You may find they work better, but can take a little longer to take effect. Examples are omeprazole, lansoprazole, pantoprazole, rabeprazole, and esomeprazole. Many of these are available over-the-counter or  available as generics.   Take an antacid 30 to after eating and at bedtime, but not at the same time as an acid blocker.   Try not to take NSAIDs such as ibuprofen. Aspirin may also cause problems, but if taking it for your heart or other medical reasons, talk to your doctor before stopping it; you don't want to cause a worse problem, like a heart attack or stroke.  Diet   If certain foods seem to cause your pain, try not to eat them. Certain foods can worsen symptoms of gastritis. Limit or avoid fatty, fried, and spicy foods, as well as coffee, chocolate, mint, and foods with high acid content such as tomatoes and citrus fruit and juices (orange, grapefruit, lemon).   Eat slowly and chew food well before swallowing. Symptoms of gastritis can be worsened by certain foods.   Don't drink alcohol. It can irritate the stomach.   Don't consume caffeine, or use tobacco. These can delay healingand worsen your problem.   Try eating smaller meals with snacks in between.   Keep an empty stomach for 2 to 3 hours before lying down.   Prop the head of the bed up if you have overnight symptoms. This helps acid clear from your esophagus.  Follow-up care  Follow up with your healthcare provider or as advised.  When to seek medical advice  Call your healthcare provider right away if any of the following occur:   Stomach pain worsens or moves to the right lower part of the abdomen   Chest pain  appears, or if it worsens or spreads to the chest, back, neck, shoulder, or arm   Frequent vomiting (can't keep down liquids)   Blood in the stool or vomit (red or black color)   Feeling weak or dizzy, fainting, or having trouble breathing   Fever of 100.59F (38C) or higher, or as directed by your healthcare provider   Abdominal swelling  Date Last Reviewed: 02/27/2017   2000-2018 [employer], [place]. [address] [place], [place] [address]. All rights reserved. This information is not intended as a substitute for professional medical care. Always follow your healthcare professional's instructions.

## 2018-05-13 NOTE — Anesthesia Preprocedure Evaluation (Signed)
Anesthesia Evaluation    AIRWAY    Mallampati: II    TM distance: >3 FB  Neck ROM: full  Mouth Opening:full  Planned to use difficult airway equipment: No CARDIOVASCULAR           DENTAL        (+) implants     PULMONARY         OTHER FINDINGS                  Relevant Problems   No relevant active problems               Anesthesia Plan    ASA 2     general               (Discussed with patient IVGA/deep sedation as indicated with risk to include but not limited to N/V, H/A, sore throat.  Questions answered. )      intravenous induction   Detailed anesthesia plan: general IV  Monitors/Adjuncts: other (Blood pressure cuff, Pulse Oximeter, EKG, ETCO2)    Post Op: other (Endo PACU)  Post op pain management: per surgeon    informed consent obtained    Plan discussed with CRNA.                   Signed by: Spero Geralds 05/13/18 9:51 AM

## 2018-05-13 NOTE — H&P (Signed)
GI PRE PROCEDURE NOTE    Proceduralist Comments:   Review of Systems and Past Medical / Surgical History performed: Yes     Indications: Abdominal pain, Dysphagia and GERD  and Colon cancer screening    Outpatient Prescriptions Marked as Taking for the 05/13/18 encounter [facility] Encounter)   Medication Sig Dispense Refill   . Ascorbic Acid (VITAMIN C) 1000 MG tablet Take 1,000 mg by mouth daily     . b complex vitamins capsule Take 1 capsule by mouth daily     . Cholecalciferol (VITAMIN D3) 3000 units Tab Take by mouth     . famotidine (PEPCID) 20 MG tablet Take 1 tablet (20 mg total) by mouth 2 (two) times daily 30 tablet 0   . Multiple Vitamins-Minerals (MULTIVITAMIN WITH MINERALS) tablet Take 1 tablet by mouth daily     . vitamin A 78295 UNIT capsule Take 10,000 Units by mouth daily     . VITAMIN K PO Take by mouth         Allergies   Allergen Reactions   . Penicillins        Social History - non-contributory  Family History - non-contributory    Review of Systems - General ROS: negative for - chills, fever or malaise  Respiratory ROS: no cough, shortness of breath, or wheezing  Cardiovascular ROS: no chest pain or dyspnea on exertion  Gastrointestinal ROS: no abdominal pain, change in bowel habits, or black or bloody stools      Physical Exam / Laboratory Data (If applicable)       General: Alert and cooperative  Lungs: Lungs clear to auscultation  Cardiac: RRR, normal S1S2.    Abdomen: Soft, non tender. Normal active bowel sounds  Other:       Dysphagia,  Abdominal cramping,  Chronic GERD  Colon cancer screening  Change in bowel habit        EGD with biopsy, possible dilation  colonoscopy with possible biopsy/polypectomy        Planned Sedation:   Deep sedation with anesthesia    Attestation:   [name] has been reassessed immediately prior to the procedure and is an appropriate candidate for the planned sedation and procedure. Risks, benefits and alternatives to the planned procedure and sedation have  been explained to the patient or guardian:  yes        Signed by: Virgina Norfolk

## 2018-05-13 NOTE — Transfer of Care (Signed)
Anesthesia Transfer of Care Note    Patient: Bill Kaiser    Procedures performed: Procedure(s):  EGD, COLONOSCOPY WITH BIOPSY    Anesthesia type: General TIVA    Patient location:Phase II PACU    Last vitals:   Vitals:    05/13/18 1139   BP: 117/67   Pulse: 67   Resp: 20   Temp: 36.4 C (97.5 F)   SpO2: 100%       Post pain: Patient not complaining of pain, continue current therapy      Mental Status:awake    Respiratory Function: tolerating nasal cannula    Cardiovascular: stable    Nausea/Vomiting: patient not complaining of nausea or vomiting    Hydration Status: adequate    Post assessment: no apparent anesthetic complications    Signed by: Boris Sharper  05/13/18 11:40 AM

## 2018-05-15 ENCOUNTER — Encounter: Payer: Self-pay | Admitting: Gastroenterology

## 2018-05-18 LAB — LAB USE ONLY - HISTORICAL SURGICAL PATHOLOGY

## 2018-07-23 ENCOUNTER — Ambulatory Visit (INDEPENDENT_AMBULATORY_CARE_PROVIDER_SITE_OTHER): Payer: 59 | Admitting: Internal Medicine

## 2018-07-23 ENCOUNTER — Encounter (INDEPENDENT_AMBULATORY_CARE_PROVIDER_SITE_OTHER): Payer: Self-pay | Admitting: Internal Medicine

## 2018-07-23 ENCOUNTER — Telehealth (INDEPENDENT_AMBULATORY_CARE_PROVIDER_SITE_OTHER): Payer: Self-pay | Admitting: Internal Medicine

## 2018-07-23 VITALS — BP 114/72 | HR 71 | Temp 97.2°F | Resp 16 | Ht 75.0 in | Wt 238.0 lb

## 2018-07-23 DIAGNOSIS — F32A Depression, unspecified: Secondary | ICD-10-CM

## 2018-07-23 DIAGNOSIS — M1990 Unspecified osteoarthritis, unspecified site: Secondary | ICD-10-CM

## 2018-07-23 DIAGNOSIS — F329 Major depressive disorder, single episode, unspecified: Secondary | ICD-10-CM

## 2018-07-23 DIAGNOSIS — F339 Major depressive disorder, recurrent, unspecified: Secondary | ICD-10-CM

## 2018-07-23 MED ORDER — SERTRALINE HCL 50 MG PO TABS
50.00 mg | ORAL_TABLET | Freq: Every day | ORAL | 0 refills | Status: DC
Start: 2018-07-23 — End: 2018-07-24

## 2018-07-23 MED ORDER — ALPRAZOLAM 0.5 MG PO TABS
0.50 mg | ORAL_TABLET | Freq: Two times a day (BID) | ORAL | 0 refills | Status: DC | PRN
Start: 2018-07-23 — End: 2021-03-19

## 2018-07-23 NOTE — Progress Notes (Signed)
Subjective:      Patient ID: Bill Kaiser is a 52 y.o. male.    Chief Complaint:  Chief Complaint   Patient presents with   . PTSD     Ongoing    . Insomnia   . Panic Attack   . Arthritis     In neck, painful      Mood down/insomnia     Restlessness    Have seen psych in recent past    Neck pain  HPI:  HPI    Problem List:  There is no problem list on file for this patient.      Current Medications:  Current Outpatient Prescriptions   Medication Sig Dispense Refill   . Ascorbic Acid (VITAMIN C) 1000 MG tablet Take 1,000 mg by mouth daily     . b complex vitamins capsule Take 1 capsule by mouth daily     . Cholecalciferol (VITAMIN D3) 3000 units Tab Take by mouth     . famotidine (PEPCID) 20 MG tablet Take 1 tablet (20 mg total) by mouth 2 (two) times daily 30 tablet 0   . vitamin A 25956 UNIT capsule Take 10,000 Units by mouth daily     . VITAMIN K PO Take by mouth     . ALPRAZolam (XANAX) 0.5 MG tablet Take 1 tablet (0.5 mg total) by mouth 2 (two) times daily as needed for Anxiety 30 tablet 0   . sertraline (ZOLOFT) 50 MG tablet Take 1 tablet (50 mg total) by mouth daily 30 tablet 0     No current facility-administered medications for this visit.        Allergies:  Allergies   Allergen Reactions   . Penicillins        Past Medical History:  Past Medical History:   Diagnosis Date   . Arthritis     neck, right knee, and arm   . Claustrophobia    . Depression    . Dysphagia     food is sticking for 4 years   . Ear, nose and throat disorder     tinnitis   . Gastric ulceration     ???   . Gastroesophageal reflux disease    . Headache     from neck pain   . Hypoglycemia    . Low back pain    . Pneumonia     h/o multiple times       Past Surgical History:  Past Surgical History:   Procedure Laterality Date   . EGD, COLONOSCOPY N/A 05/13/2018    Procedure: EGD, COLONOSCOPY WITH BIOPSY;  Surgeon: Virgina Norfolk, MD;  Location: [facility];  Service: Gastroenterology;  Laterality: N/A;   . neck injection      cortisone   .  SPLENECTOMY, TOTAL         Family History:  Family History   Problem Relation Age of Onset   . Dementia Mother    . No known problems Father    . No known problems Brother    . Malignant hyperthermia Neg Hx    . Pseudochol deficiency Neg Hx        Social History:  Social History     Social History   . Marital status: Single     Spouse name: N/A   . Number of children: N/A   . Years of education: N/A     Occupational History   . Not on file.     Social History  Main Topics   . Smoking status: Former Smoker     Quit date: 03/2018   . Smokeless tobacco: Current User      Comment: vapes   . Alcohol use 4.2 - 4.8 oz/week     7 - 8 Cans of beer per week   . Drug use: No   . Sexual activity: Not on file     Other Topics Concern   . Not on file     Social History Narrative   . No narrative on file       The following sections were reviewed this encounter by the provider: Meds           ROS:  Review of Systems  Constitutional: Negative for fever, activity change, appetite change, fatigue and unexpected weight change.   HENT: Negative for ear pain, congestion, sore throat, rhinorrhea, neck pain, dental problem, postnasal drip and sinus pressure.   Eyes: Negative for discharge, redness and visual disturbance.   Respiratory: Negative for cough, chest tightness, shortness of breath and wheezing.   Cardiovascular: Negative for chest pain, palpitations and leg swelling.   Skin: Negative for pallor and rash.   Neurological: Negative for dizziness, weakness,  Psychiatric/Behavioral: Negative for sleep disturbance, dysphoric mood and decreased concentration. The patient is not nervous/anxious.      Vitals:  BP 114/72 (BP Site: Left arm, Patient Position: Sitting, Cuff Size: Large)   Pulse 71   Temp 97.2 F (36.2 C) (Tympanic)   Resp 16   Ht 1.905 m (6\' 3" )   Wt 108 kg (238 lb)   SpO2 98%   BMI 29.75 kg/m      Objective:     Physical Exam:  Physical Exam      General appearance - alert, well appearing, and in no  distress  Mental status - alert, oriented to person, place, and time  Eyes - pupils equal and reactive, extraocular eye movements intac  Chest - clear to auscultation, no wheezes, rales or rhonchi, symmetric air entry  Heart - normal rate, regular rhythm, normal S1, S2, no murmurs  Back exam - full range of motion, no tenderness  Neurological - alert, oriented, normal speech  Musculoskeletal - no joint tenderness  Skin - normal coloration and turgor, no rashes, no suspicious skin lesions noted    There is no problem list on file for this patient.    Current Outpatient Prescriptions   Medication Sig Dispense Refill   . Ascorbic Acid (VITAMIN C) 1000 MG tablet Take 1,000 mg by mouth daily     . b complex vitamins capsule Take 1 capsule by mouth daily     . Cholecalciferol (VITAMIN D3) 3000 units Tab Take by mouth     . famotidine (PEPCID) 20 MG tablet Take 1 tablet (20 mg total) by mouth 2 (two) times daily 30 tablet 0   . vitamin A 60454 UNIT capsule Take 10,000 Units by mouth daily     . VITAMIN K PO Take by mouth     . ALPRAZolam (XANAX) 0.5 MG tablet Take 1 tablet (0.5 mg total) by mouth 2 (two) times daily as needed for Anxiety 30 tablet 0   . sertraline (ZOLOFT) 50 MG tablet Take 1 tablet (50 mg total) by mouth daily 30 tablet 0     No current facility-administered medications for this visit.      Allergies   Allergen Reactions   . Penicillins        Assessment:  Plan:     1. Recurrent major depressive disorder, remission status unspecified/anxiety  *  - Behavioral Health Referral: Marya Fossa, [facility] ([facility])    2. Arthritis    - Ambulatory referral to Pain Clinic        Risk & Benefits of the new medication(s) were explained to the patien) who verbalized understanding & agreed to the treatment plan. Patient) encouraged to contact me/clinical staff with any questions/concerns          Pershing Cox, MD

## 2018-07-23 NOTE — Telephone Encounter (Signed)
PT's prescriptions from his office visit today were sent to the incorrect pharmacy. PT states that he would like them sent to    [id number] - [place], [place] - [address] [phone] (Phone)  [phone] (Fax)

## 2018-07-24 ENCOUNTER — Other Ambulatory Visit (INDEPENDENT_AMBULATORY_CARE_PROVIDER_SITE_OTHER): Payer: Self-pay | Admitting: Internal Medicine

## 2018-07-24 MED ORDER — SERTRALINE HCL 50 MG PO TABS
50.00 mg | ORAL_TABLET | Freq: Every day | ORAL | 0 refills | Status: DC
Start: 2018-07-24 — End: 2018-08-10

## 2018-07-24 NOTE — Telephone Encounter (Signed)
Please review

## 2018-07-24 NOTE — Telephone Encounter (Signed)
Medication send to pharm

## 2018-07-24 NOTE — Telephone Encounter (Signed)
noted 

## 2018-08-11 ENCOUNTER — Encounter: Payer: Self-pay | Admitting: Certified Registered"

## 2018-08-11 ENCOUNTER — Encounter
Admission: RE | Disposition: A | Discharge status: Home / Self Care | Payer: Self-pay | Source: Ambulatory Visit | Attending: Gastroenterology

## 2018-08-11 ENCOUNTER — Ambulatory Visit: Payer: 59 | Admitting: Certified Registered"

## 2018-08-11 ENCOUNTER — Ambulatory Visit
Admission: RE | Admit: 2018-08-11 | Discharge: 2018-08-11 | Disposition: A | Discharge status: Home / Self Care | Payer: 59 | Source: Ambulatory Visit | Attending: Gastroenterology | Admitting: Gastroenterology

## 2018-08-11 DIAGNOSIS — K219 Gastro-esophageal reflux disease without esophagitis: Secondary | ICD-10-CM | POA: Insufficient documentation

## 2018-08-11 DIAGNOSIS — K210 Gastro-esophageal reflux disease with esophagitis: Secondary | ICD-10-CM

## 2018-08-11 DIAGNOSIS — Z87891 Personal history of nicotine dependence: Secondary | ICD-10-CM | POA: Insufficient documentation

## 2018-08-11 DIAGNOSIS — Z8711 Personal history of peptic ulcer disease: Secondary | ICD-10-CM | POA: Insufficient documentation

## 2018-08-11 DIAGNOSIS — K2950 Unspecified chronic gastritis without bleeding: Secondary | ICD-10-CM | POA: Insufficient documentation

## 2018-08-11 DIAGNOSIS — M542 Cervicalgia: Secondary | ICD-10-CM | POA: Insufficient documentation

## 2018-08-11 HISTORY — DX: Post-traumatic stress disorder, unspecified: F43.10

## 2018-08-11 HISTORY — PX: EGD, BIOPSY: SHX3796

## 2018-08-11 SURGERY — ESOPHAGOGASTRODUODENOSCOPY (EGD), BIOPSY
Anesthesia: Anesthesia General | Site: Abdomen | Wound class: Clean Contaminated

## 2018-08-11 MED ORDER — PROPOFOL 10 MG/ML IV EMUL (WRAP)
INTRAVENOUS | Status: DC | PRN
Start: 2018-08-11 — End: 2018-08-11
  Administered 2018-08-11: 80 mg via INTRAVENOUS
  Administered 2018-08-11: 20 mg via INTRAVENOUS
  Administered 2018-08-11: 10 mg via INTRAVENOUS
  Administered 2018-08-11: 20 mg via INTRAVENOUS

## 2018-08-11 MED ORDER — LIDOCAINE HCL 2 % IJ SOLN
INTRAMUSCULAR | Status: DC | PRN
Start: 2018-08-11 — End: 2018-08-11
  Administered 2018-08-11: 100 mg via INTRAVENOUS

## 2018-08-11 MED ORDER — PROPOFOL INFUSION 10 MG/ML
INTRAVENOUS | Status: DC | PRN
Start: 2018-08-11 — End: 2018-08-11
  Administered 2018-08-11: 140 ug/kg/min via INTRAVENOUS

## 2018-08-11 MED ORDER — LACTATED RINGERS IV SOLN
INTRAVENOUS | Status: DC
Start: 2018-08-11 — End: 2018-08-11

## 2018-08-11 MED ORDER — PANTOPRAZOLE SODIUM 40 MG PO TBEC
40.00 mg | DELAYED_RELEASE_TABLET | Freq: Two times a day (BID) | ORAL | 2 refills | Status: DC
Start: 2018-08-11 — End: 2020-07-31

## 2018-08-11 SURGICAL SUPPLY — 22 items
CANISTER 1000CC (Procedure Accessories) ×2 IMPLANT
FORCEPS BIOPSY L240 CM MICROMESH TEETH STREAMLINE CATHETER NEEDLE (Instrument) IMPLANT
FORCEPS BIOPSY L240 CM STANDARD CAPACITY (Instrument) ×1
FORCEPS RADIAL JAW 4 2.8MM (Instrument) ×1
GAUZE SPONGE 4X4 NS (Dressing) ×10
GOWN ISO YELLOW UNIVERSAL (Gown) ×4 IMPLANT
JELLY LUB EZ LF STRL H2O SOL NGRS TRNLU (Irrigation Solutions) ×1 IMPLANT
JELLY LUBE STRL FLPTOP 4OZ (Irrigation Solutions) ×1
KIT CARRY ON ENDO PROCEDURE (Kits) ×2
KIT CARRY ON ENDO PROCEDURE CPK4557000001 (Kits) ×1 IMPLANT
KIT UNIVERSAL IRRIGATION SOL (Kits) ×2 IMPLANT
SOFT-CUF 2T ADULT SUB-MIN (Cuff) ×2 IMPLANT
SOL WATER STERILE 1000CC BTLE (Irrigation Solutions) ×1
SPONGE GAUZE L4 IN X W4 IN 16 PLY (Dressing) ×10
SPONGE GAUZE L4 IN X W4 IN 16 PLY MAXIMUM ABSORBENT USP TYPE VII (Dressing) ×10 IMPLANT
SYRINGE 50 ML GRADUATE NONPYROGENIC DEHP (Syringes, Needles) ×1
SYRINGE 50 ML GRADUATE NONPYROGENIC DEHP FREE PVC FREE BD MEDICAL (Syringes, Needles) ×1 IMPLANT
SYRINGE SLIP-TIP 60CC (Syringes, Needles) ×1
WATER STERILE PLASTIC POUR BOTTLE 1000 (Irrigation Solutions) ×1
WATER STERILE PLASTIC POUR BOTTLE 1000 ML (Irrigation Solutions) ×1 IMPLANT
WATER STERILE PLASTIC POUR BOTTLE 250 ML (Irrigation Solutions) ×1 IMPLANT
WATER STRL IRRIG 250ML BTL (Irrigation Solutions) ×1

## 2018-08-11 NOTE — Discharge Instr - AVS First Page (Signed)
Endoscopy Discharge Instructions  General Instructions:  1. Following sedation, your judgement, perception, and coordination are considered impaired. Even though you may feel awake and alert, you are considered legally intoxicated. Therefore, until the next morning;   Do not Drive   Do not operate appliances or equipment that requires reaction time (e.g.    Stove, electrical tools, machinery)   Do not sign legal documents or be involved in important decisions.   Do not smoke if alone   Do not drink alcoholic beverages   Go directly home and rest for several hours before resuming your routine    activities.   It is highly recommended to have a responsible adult stay with you for the   next 24 hours    2. Tenderness, swelling or pain may occur at the IV site where you received sedation. If you experience this, apply warm soaks to the area. Notify your physician if this persists.    Instructions Specific To Procedures - Report To Physician Any Of The Following:    Upper Endoscopy, ERCP, Dilations   1. Pain in Chest   2. Nausea/vomitting   3. Fevers/Chills within 24 hours after procedure. Temp>101deg F   4. Severe and persistent abdominal pain and bloating     In Addition:   Mild throat soreness may follow this procedure. Warm salt water gargling or   lozenges of your choice will most likely relieve your discomfort or cold drinks and   popsicles.     Colon/Sigmoidoscopy/Proctoscopy   1. Severe and persistent abdominal pain/bloating which does not subside within 2-3 hours   2. Large amount of rectal bleeding (some mucosal blood streaking may occur, especially if biopsy or polypectomy was done or if hemorrhoids are present.   3. Nausea/vomitting   4. Fevers/Chills within 24 hours after procedure. Temp>101deg F     In Addition:\     If polyp has been removed, DO NOT take aspirin or aspirin containing products   (e.g. Anacin, Alka Seltzer, Bufferin, Etc.) or non-steroidal anti-inflammatory drugs  (e.g. Advil, Motrin,  etc.) for 14 days unless otherwise advised otherwise on your Patient Procedure Report and Recommendations. Tylenol or extra Strength Tylenol is permitted.        Additional Discharge Instructions  Your diet after the procedure: No restriction.  Special Instructions: See Procedure Patient Report Recommendations    If biopsies were taken, call Dr. Jeryl Umholtz at [phone] in one week for results    Patient education literature given; Yes      If you have questions or problems contact your MD immediately. If you need immediate attention, call your MD, 911 and/or go to nearest emergency room.

## 2018-08-11 NOTE — OR Nursing (Signed)
Pt removed bite block from own mouth.

## 2018-08-11 NOTE — OR Nursing (Signed)
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

## 2018-08-11 NOTE — H&P (Signed)
GI PRE PROCEDURE NOTE    Proceduralist Comments:   Review of Systems and Past Medical / Surgical History performed: Yes     Indications: f/u gastric ulcer    Outpatient Prescriptions Marked as Taking for the 08/11/18 encounter [facility] Encounter)   Medication Sig Dispense Refill   . ALPRAZolam (XANAX) 0.5 MG tablet Take 1 tablet (0.5 mg total) by mouth 2 (two) times daily as needed for Anxiety 30 tablet 0   . b complex vitamins capsule Take 1 capsule by mouth every other day         . Cholecalciferol (VITAMIN D3) 3000 units Tab Take by mouth         . famotidine (PEPCID) 20 MG tablet Take 1 tablet (20 mg total) by mouth 2 (two) times daily 30 tablet 0   . vitamin A 16109 UNIT capsule Take 10,000 Units by mouth         . VITAMIN K PO Take by mouth             Allergies   Allergen Reactions   . Penicillins        Social History - non-contributory  Family History - non-contributory    Review of Systems - General ROS: negative for - chills, fever or malaise  Respiratory ROS: no cough, shortness of breath, or wheezing  Cardiovascular ROS: no chest pain or dyspnea on exertion  Gastrointestinal ROS: no abdominal pain, change in bowel habits, or black or bloody stools      Physical Exam / Laboratory Data (If applicable)       General: Alert and cooperative  Lungs: Lungs clear to auscultation  Cardiac: RRR, normal S1S2.    Abdomen: Soft, non tender. Normal active bowel sounds  Other:       Dysphagia, unspecified type [R13.10]  Abdominal pain, generalized [R10.84]  Chronic GERD [K21.9]  Colon cancer screening [Z12.11]  Bowel habit changes [R19.4]        EGD with biopsy, possible dilation          Planned Sedation:   Deep sedation with anesthesia    Attestation:   Bill Kaiser has been reassessed immediately prior to the procedure and is an appropriate candidate for the planned sedation and procedure. Risks, benefits and alternatives to the planned procedure and sedation have been explained to the patient or guardian:   yes        Signed by: Virgina Norfolk

## 2018-08-11 NOTE — Transfer of Care (Signed)
Anesthesia Transfer of Care Note    Patient: Bill Kaiser    Procedures performed: Procedure(s):  EGD, BIOPSY    Anesthesia type: General TIVA    Patient location:Phase II PACU    Last vitals:   Vitals:    08/11/18 0952   BP: 99/53   Pulse: 65   Resp: 14   Temp: 36.2 C (97.1 F)   SpO2: 96%       Post pain: Patient not complaining of pain, continue current therapy      Mental Status:awake    Respiratory Function: tolerating room air    Cardiovascular: stable    Nausea/Vomiting: patient not complaining of nausea or vomiting    Hydration Status: adequate    Post assessment: no apparent anesthetic complications and no reportable events    Signed by: Gwenevere Ghazi  08/11/18 9:52 AM

## 2018-08-11 NOTE — Discharge Instructions (Signed)
Gastritis (Adult)    Gastritis isinflammation andirritation of the stomach lining. You can have it for a short time (acute) or be long lasting (chronic). Infection with bacteria calledH pylori most often causes gastritis.More than a third of people in the [place] have these bacteria in their bodies. In many cases,H pyloricauses no problems or symptoms. In some people, though, the infection irritates the stomach lining and causes gastritis. H. pylori may be diagnosed through blood, stool, or breath tests, we well as through biopsy during an endoscopy. Other causes of stomach irritation include drinking alcohol, smoking or chewing tobacco, or taking pain-relieving medicines called NSAIDs (such as aspirin or ibuprofen). Certain drugs (such as cocaine) and immune conditions can also cause gastritis.  Symptoms of gastritis can include:   Belly pain or bloating   Feeling full quickly   Loss of appetite   Nausea or vomiting   Vomiting blood or having black stools   Feeling more tired than usual  An inflamed and irritated stomach lining is more likely to develop a sore called an ulcer. To help prevent this, gastritis should be treated.  Home care  If needed, our healthcare provider may prescribe medicines. If you haveH pyloriinfection, treating it will likely relieve your symptoms. Other changes can help reduce stomach irritation and help it heal.   If you have been prescribed medicines forH pyloriinfection, take them as directed. Take all of the medicine until it is finished or your healthcare provider tells you to stop, even if you feel better.   Your healthcare provider may advise you not to take NSAIDs. If you take daily aspirin for your heart or other medical reasons, do not stop without talking to your healthcare provider first.   Don't drink alcohol.   Stop smoking. Smoking can irritate the stomach and delay healing. As much as possible, stay away from second hand smoke.  Follow-up care  Follow up  with your healthcare provider, or as advised by our staff. You may need testing to check for inflammation or an ulcer.  When to seek medical advice  Call your healthcare provider for any of the following:   Stomach pain that gets worse or moves to the lower right belly (appendix area)   Chest pain that appears or gets worse, or spreads to the back, neck, shoulder, or arm   Frequent vomiting (can't keep down liquids)   Blood in the stool or vomit (red or black in color)   Feeling weak or dizzy   Shortness of breath   Unexplained weight loss   Fever of 100.4F (38C) or higher, or as directed by your healthcare provider  Date Last Reviewed: 02/27/2017   2000-2019 [employer] [address], [place] [address]. All rights reserved. This information is not intended as a substitute for professional medical care. Always follow your healthcare professional's instructions.

## 2018-08-11 NOTE — Anesthesia Postprocedure Evaluation (Signed)
Anesthesia Post Evaluation    Patient: Bill Kaiser    Procedure(s):  EGD, BIOPSY    Anesthesia type: general    Last Vitals:   Vitals:    08/11/18 1020   BP: 109/74   Pulse: (!) 52   Resp: 18   Temp:    SpO2: 97%       Anesthesia Post Evaluation:     Patient Evaluated: PACU  Patient Participation: complete - patient participated  Level of Consciousness: awake    Pain Management: adequate    Airway Patency: patent    Anesthetic complications: No      PONV Status: none    Cardiovascular status: acceptable  Respiratory status: acceptable  Hydration status: acceptable        Signed by: Harriet Butte, 08/11/2018 2:11 PM

## 2018-08-11 NOTE — Anesthesia Preprocedure Evaluation (Signed)
Anesthesia Evaluation    AIRWAY    Mallampati: II    TM distance: >3 FB  Neck ROM: full  Mouth Opening:full  Planned to use difficult airway equipment: No CARDIOVASCULAR    cardiovascular exam normal, regular and normal       DENTAL    no notable dental hx       PULMONARY    pulmonary exam normal and clear to auscultation     OTHER FINDINGS                Relevant Problems   No relevant active problems               Anesthesia Plan    ASA 2     general               (        Bill Kaiser is a 52 y.o. male     Past Medical History:  Diagnosis Date  . Arthritis    neck, right knee, and arm  . Claustrophobia   . Depression   . Dysphagia    food is sticking for 4 years  . Ear, nose and throat disorder    tinnitis  . Gastric ulceration    ???  . Gastroesophageal reflux disease   . Headache    from neck pain  . Hypoglycemia   . Low back pain   . Pneumonia    h/o multiple times  . PTSD (post-traumatic stress disorder)       The patient denies history of significant cardiopulmonary issues or prior problems with anesthesia (other than addressed above) and is appropriately NPO. Physical exam as above.    Risks and benefits discussed with the patient including but not limited to damage to the teeth and soft tissues of the mouth and airway (in the event of airway manipulation), aspiration,and unanticipated allergic reaction. The patient states understanding and consents to the anesthetic plan.      Plan for GA with TIVA.  )      intravenous induction   Detailed anesthesia plan: general IV        Post op pain management: per surgeon    informed consent obtained    Plan discussed with CRNA.                   Signed by: Merilyn Baba 08/11/18 8:41 AM

## 2018-08-12 ENCOUNTER — Encounter: Payer: Self-pay | Admitting: Gastroenterology

## 2018-08-13 LAB — LAB USE ONLY - HISTORICAL SURGICAL PATHOLOGY

## 2018-08-19 ENCOUNTER — Ambulatory Visit (INDEPENDENT_AMBULATORY_CARE_PROVIDER_SITE_OTHER): Payer: Self-pay | Admitting: Internal Medicine

## 2019-03-01 ENCOUNTER — Emergency Department
Admission: EM | Admit: 2019-03-01 | Discharge: 2019-03-01 | Disposition: A | Discharge status: Home / Self Care | Payer: 59 | Attending: Emergency Medicine | Admitting: Emergency Medicine

## 2019-03-01 ENCOUNTER — Emergency Department: Payer: 59

## 2019-03-01 DIAGNOSIS — J40 Bronchitis, not specified as acute or chronic: Secondary | ICD-10-CM | POA: Insufficient documentation

## 2019-03-01 DIAGNOSIS — Z87891 Personal history of nicotine dependence: Secondary | ICD-10-CM | POA: Insufficient documentation

## 2019-03-01 MED ORDER — BENZONATATE 100 MG PO CAPS
100.00 mg | ORAL_CAPSULE | Freq: Three times a day (TID) | ORAL | 0 refills | Status: DC | PRN
Start: 2019-03-01 — End: 2020-07-31

## 2019-03-01 MED ORDER — ALBUTEROL SULFATE HFA 108 (90 BASE) MCG/ACT IN AERS
2.0000 | INHALATION_SPRAY | RESPIRATORY_TRACT | 0 refills | Status: DC | PRN
Start: 2019-03-01 — End: 2019-03-31

## 2019-03-01 NOTE — ED Triage Notes (Signed)
Pain in bottom L rib pain.

## 2019-03-01 NOTE — ED Triage Notes (Signed)
Pt with coughing for about 3 weeks; started with sinus issuses; pt states hx of pneumonia.

## 2019-03-09 NOTE — ED Provider Notes (Signed)
Physician/Midlevel provider first contact with patient: 03/01/19 1737         History     Chief Complaint   Patient presents with   . Cough   . Back Pain     HPI   Bill Kaiser is a 53 y.o. male    1. Location/type of symptoms: L sided back pain  2. Onset: 2 weeks ago  3. Severity of symptoms: moderate   4. Radiation of symptoms: no   5. Associated signs and symptoms: cough and congestion x 5 days. No fever.    6. Symptoms are improved by: nothing   7. Context: reports he finished a z-pack 2 weeks ago            Past Medical History:   Diagnosis Date   . Arthritis     neck, right knee, and arm   . Claustrophobia    . Depression    . Dysphagia     food is sticking for 4 years   . Ear, nose and throat disorder     tinnitis   . Gastric ulceration     ???   . Gastroesophageal reflux disease    . Headache     from neck pain   . Hypoglycemia    . Low back pain    . Pneumonia     h/o multiple times   . PTSD (post-traumatic stress disorder)        Past Surgical History:   Procedure Laterality Date   . EGD, BIOPSY N/A 08/11/2018    Procedure: EGD, BIOPSY;  Surgeon: Virgina Norfolk, MD;  Location: [facility];  Service: Gastroenterology;  Laterality: N/A;   . EGD, COLONOSCOPY N/A 05/13/2018    Procedure: EGD, COLONOSCOPY WITH BIOPSY;  Surgeon: Virgina Norfolk, MD;  Location: [facility];  Service: Gastroenterology;  Laterality: N/A;   . neck injection      cortisone   . SPLENECTOMY, TOTAL         Family History   Problem Relation Age of Onset   . Dementia Mother    . No known problems Father    . No known problems Brother    . Malignant hyperthermia Neg Hx    . Pseudochol deficiency Neg Hx        Social  Social History     Tobacco Use   . Smoking status: Former Smoker     Last attempt to quit: 03/2018     Years since quitting: 0.9   . Smokeless tobacco: Never Used   . Tobacco comment: vapes   Substance Use Topics   . Alcohol use: No   . Drug use: No       .     Allergies   Allergen Reactions   . Penicillins        Home  Medications             ALPRAZolam (XANAX) 0.5 MG tablet     Take 1 tablet (0.5 mg total) by mouth 2 (two) times daily as needed for Anxiety     Ascorbic Acid (VITAMIN C) 1000 MG tablet     Take 1,000 mg by mouth daily     b complex vitamins capsule     Take 1 capsule by mouth every other day         Cholecalciferol (VITAMIN D3) 3000 units Tab     Take by mouth         famotidine (PEPCID) 20  MG tablet     Take 1 tablet (20 mg total) by mouth 2 (two) times daily     vitamin A 16109 UNIT capsule     Take 10,000 Units by mouth         VITAMIN K PO     Take by mouth               Flagged for Removal             pantoprazole (PROTONIX) 40 MG tablet     Take 1 tablet (40 mg total) by mouth 2 (two) times daily           Review of Systems   Constitutional: Negative for fever.   HENT: Positive for congestion.    Respiratory: Positive for cough.    Cardiovascular: Negative for chest pain.   Musculoskeletal: Positive for back pain.   All other systems reviewed and are negative.      Physical Exam    BP: 131/81, Heart Rate: 93, Temp: 97.7 F (36.5 C), Resp Rate: 20, SpO2: 96 %, Weight: 109 kg    Physical Exam     Nursing notes and vital signs reviewed     General - no acute distress  Head - normocephalic, atraumatic  Eyes - no scleral icterus, EOMI  Ears/Nose/Throat - moist mucous membranes; airway patent, oropharynx clear  Cardiovascular - normal rate, regular rhythm, 2+ distal pulses, no LE swelling, no calf tenderness  Pulmonary - normal respiratory effort, CTAB  Abdomen - soft, no TTP, non distended, + bowel sounds  Musculoskeletal - no gross injuries or deformities.   Neurological - alert, face symmetric, no dysarthria, no gross deficits  Psych - behaving appropriately   Skin - warm, dry      MDM and ED Course   DDx includes pneumonia, flu, bronchitis, MSK pain.     XR Chest 2 Views   Final Result    No acute process seen.      Charlene Brooke, MD    03/01/2019 6:42 PM        Labs Reviewed   RAPID INFLUENZA A/B ANTIGENS     Narrative:     ORDER#: [id number]                                    ORDERED BY: Avel Sensor  SOURCE: Nasal Aspirate                               COLLECTED:  03/01/19 18:52  ANTIBIOTICS AT COLL.:                                RECEIVED :  03/01/19 19:06  Influenza Rapid Antigen A&B                FINAL       03/01/19 19:15  03/01/19   Negative for Influenza A and B             Reference Range: Negative              MDM        PULSE OXIMETRY    Oxygen Saturation by Pulse Oximetry: 96%  Interpretation:  normal  Interventions: none  Procedures    Clinical Impression & Disposition     Clinical Impression  Final diagnoses:   Bronchitis        ED Disposition     ED Disposition Condition Date/Time Comment    Discharge  Mon Mar 01, 2019  7:14 PM Bill Kaiser discharge to home/self care.    Condition at disposition: Stable           Discharge Medication List as of 03/01/2019  7:14 PM      START taking these medications    Details   albuterol (PROVENTIL HFA;VENTOLIN HFA) 108 (90 Base) MCG/ACT inhaler Inhale 2 puffs into the lungs every 4 (four) hours as needed for Wheezing or Shortness of Breath (coughing) Dispense with spacer, Starting Mon 03/01/2019, Until Wed 03/31/2019, Normal      benzonatate (TESSALON) 100 MG capsule Take 1 capsule (100 mg total) by mouth 3 (three) times daily as needed for Cough, Starting Mon 03/01/2019, Normal                       Avel Sensor, MD  03/09/19 1757

## 2019-12-31 DIAGNOSIS — S0990XA Unspecified injury of head, initial encounter: Secondary | ICD-10-CM

## 2019-12-31 HISTORY — DX: Unspecified injury of head, initial encounter: S09.90XA

## 2020-04-17 ENCOUNTER — Encounter (INDEPENDENT_AMBULATORY_CARE_PROVIDER_SITE_OTHER): Payer: Self-pay

## 2020-04-24 ENCOUNTER — Ambulatory Visit (INDEPENDENT_AMBULATORY_CARE_PROVIDER_SITE_OTHER): Payer: 59

## 2020-04-26 ENCOUNTER — Encounter (INDEPENDENT_AMBULATORY_CARE_PROVIDER_SITE_OTHER): Payer: Self-pay

## 2020-05-01 ENCOUNTER — Ambulatory Visit (INDEPENDENT_AMBULATORY_CARE_PROVIDER_SITE_OTHER): Payer: 59

## 2020-05-01 DIAGNOSIS — Z23 Encounter for immunization: Secondary | ICD-10-CM

## 2020-05-10 ENCOUNTER — Emergency Department: Payer: 59

## 2020-05-10 ENCOUNTER — Emergency Department
Admission: EM | Admit: 2020-05-10 | Discharge: 2020-05-10 | Disposition: A | Discharge status: Home / Self Care | Payer: 59 | Attending: Internal Medicine | Admitting: Internal Medicine

## 2020-05-10 DIAGNOSIS — S0081XA Abrasion of other part of head, initial encounter: Secondary | ICD-10-CM | POA: Insufficient documentation

## 2020-05-10 DIAGNOSIS — S80212A Abrasion, left knee, initial encounter: Secondary | ICD-10-CM | POA: Insufficient documentation

## 2020-05-10 DIAGNOSIS — S60511A Abrasion of right hand, initial encounter: Secondary | ICD-10-CM | POA: Insufficient documentation

## 2020-05-10 DIAGNOSIS — T07XXXA Unspecified multiple injuries, initial encounter: Secondary | ICD-10-CM

## 2020-05-10 DIAGNOSIS — S60512A Abrasion of left hand, initial encounter: Secondary | ICD-10-CM | POA: Insufficient documentation

## 2020-05-10 DIAGNOSIS — Z23 Encounter for immunization: Secondary | ICD-10-CM | POA: Insufficient documentation

## 2020-05-10 DIAGNOSIS — S80211A Abrasion, right knee, initial encounter: Secondary | ICD-10-CM | POA: Insufficient documentation

## 2020-05-10 MED ORDER — TETANUS-DIPHTH-ACELL PERTUSSIS 5-2.5-18.5 LF-MCG/0.5 IM SUSP
0.50 mL | Freq: Once | INTRAMUSCULAR | Status: AC
Start: 2020-05-10 — End: 2020-05-10
  Administered 2020-05-10: 0.5 mL via INTRAMUSCULAR
  Filled 2020-05-10: qty 0.5

## 2020-05-10 MED ORDER — MORPHINE SULFATE 4 MG/ML IJ/IV SOLN (WRAP)
4.0000 mg | Freq: Once | Status: AC
Start: 2020-05-10 — End: 2020-05-10
  Administered 2020-05-10: 4 mg via INTRAVENOUS
  Filled 2020-05-10: qty 1

## 2020-05-10 MED ORDER — ONDANSETRON HCL 4 MG/2ML IJ SOLN
4.00 mg | Freq: Once | INTRAMUSCULAR | Status: AC
Start: 2020-05-10 — End: 2020-05-10
  Administered 2020-05-10: 4 mg via INTRAVENOUS
  Filled 2020-05-10: qty 2

## 2020-05-10 MED ORDER — FENTANYL CITRATE (PF) 50 MCG/ML IJ SOLN (WRAP)
100.00 ug | Freq: Once | INTRAMUSCULAR | Status: AC
Start: 2020-05-10 — End: 2020-05-10
  Administered 2020-05-10: 100 ug via INTRAVENOUS
  Filled 2020-05-10: qty 2

## 2020-05-10 MED ORDER — NALOXONE HCL 4 MG/0.1ML NA LIQD
NASAL | 0 refills | Status: DC
Start: 2020-05-10 — End: 2020-07-31

## 2020-05-10 MED ORDER — OXYCODONE-ACETAMINOPHEN 5-325 MG PO TABS
2.00 | ORAL_TABLET | Freq: Four times a day (QID) | ORAL | 0 refills | Status: DC | PRN
Start: 2020-05-10 — End: 2020-07-31
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
## 2020-05-10 NOTE — ED Notes (Signed)
Bed: S 17  Expected date:   Expected time:   Means of arrival:   Comments:  T3 moving here

## 2020-05-21 ENCOUNTER — Encounter (INDEPENDENT_AMBULATORY_CARE_PROVIDER_SITE_OTHER): Payer: Self-pay

## 2020-05-22 ENCOUNTER — Ambulatory Visit (INDEPENDENT_AMBULATORY_CARE_PROVIDER_SITE_OTHER): Payer: 59

## 2020-05-22 ENCOUNTER — Encounter (INDEPENDENT_AMBULATORY_CARE_PROVIDER_SITE_OTHER): Payer: Self-pay

## 2020-05-22 DIAGNOSIS — Z23 Encounter for immunization: Secondary | ICD-10-CM

## 2020-07-31 ENCOUNTER — Ambulatory Visit (FREE_STANDING_LABORATORY_FACILITY): Payer: 59 | Admitting: Registered Nurse

## 2020-07-31 ENCOUNTER — Encounter (INDEPENDENT_AMBULATORY_CARE_PROVIDER_SITE_OTHER): Payer: Self-pay | Admitting: Registered Nurse

## 2020-07-31 VITALS — BP 119/76 | HR 75 | Temp 98.8°F | Resp 16 | Ht 75.0 in | Wt 228.0 lb

## 2020-07-31 DIAGNOSIS — J029 Acute pharyngitis, unspecified: Secondary | ICD-10-CM

## 2020-07-31 LAB — POCT RAPID STREP A: Rapid Strep A Screen POCT: NEGATIVE

## 2020-07-31 LAB — POCT INFECTIOUS MONONUCLEOSIS ANTIBODY: POCT Infectious Mono Heterophile Antibodies: NEGATIVE

## 2020-07-31 LAB — IHS AMB POCT SOFIA (TM) SARS CORONAVIRUS ANTIGEN FIA: Sofia SARS COV2 Antigen POCT: NEGATIVE

## 2020-07-31 MED ORDER — CEFDINIR 300 MG PO CAPS
300.00 mg | ORAL_CAPSULE | Freq: Two times a day (BID) | ORAL | 0 refills | Status: AC
Start: 2020-07-31 — End: 2020-08-10

## 2020-07-31 NOTE — Patient Instructions (Signed)
Follow-up Steps for Patients with Pending COVID-19 Testing - 07/09/2019     Thank you for choosing [facility]. During your visit you received a test for COVID-19 and the test performed takes at least 3-5 days to result. The goal for results is 3-5 days, however with the current increase in testing volumes, tests can take up to 10 days for results to be ready. We appreciate your patience.      What should I do while I am waiting for my test results?   • Take good care of yourself and be mindful of the safety of those around you:   • Rest and stay well hydrated.   • Use acetaminophen (Tylenol) to help manage fever.   • Wash your hands often.   • Stay at home except to get medical care.   • Call ahead before seeing your doctor, any other medical provider, or seeking medical care at any healthcare facility.   • Isolate yourself as much as possible.   • Clean all "high touch" surfaces every day.   • Most people with COVID-19 infection will get better; however, a small number will develop worsening infection which may require more intensive treatment. Please contact your doctor or present to the nearest Emergency Department if you develop the warning signs of more severe infection such as:   o A fever over 102 not controlled by acetaminophen (Tylenol)   o Shortness of breath or chest pain   o Worsening cough   o Increasing weakness   o Inability to tolerate oral fluids   • Some general things to think about:   o If you have a medical emergency and need to call 911, notify the person answering the phone that you have, or are being evaluated for COVID-19. If possible, put on a facemask before the ambulance arrives.   o Persons who are placed under active monitoring or facilitated self-monitoring should follow instructions provided by their local health department or occupational health professionals, as appropriate    When will I know the results of my COVID test?     After a minimum of 3-5 days has passed,  there are multiple ways in which you can be informed about the results of your COVID 19 test results. Please ensure we have your most up to date contact information on file before you leave your appointment.     MyChart – you may be notified of results through your MyChart account. The easiest way to find all test results done at an [facility] facility is through your MyChart account, [facility]’s online patient portal. This is also an easy way to connect with your [facility] primary care team for continued care, if this applies to you. If you do not already have   MyChart activated, you will be given an activation code at the time of testing. Please note that for children age 12-17 MyChart is not available.     You may receive a call from your Primary Care Physician or the physician who ordered the test with the results. You may also receive a call from [facility].   If none of the options above has helped you obtain your results, or you have additional questions, please contact the COVID 19 Notifications Call Center for further instructions at [phone], open 6 days a week, Monday-Saturday from 8:30am-5:00pm.     Again, the goal for results is 3-5 days, however with the current increase in testing volumes, tests can take up to 10   days for results to be ready. We appreciate your patience.     If you need help activating your MyChart account, please let us know. You can also download the MyChart application to your phone using the QR code below.         When can I stop isolation?    Until you receive your results, you should remain home and avoid contact with others (self-isolate).The decision to stop isolation precautions should be made on a case-by-case basis, in consultation with healthcare providers and state and local health departments.     Here is some general advice:     · If your test result is negative, you should stay home until you do not have a fever (without the use of fever reducing medication) or any respiratory  symptoms for at least 3 days.     · If your test result is positive, you should stay home for at least 10 days from the day your symptoms started. If you are still having symptoms at the end of 10 days, continue in-home isolation until 3 days after your symptoms stop. Talk to your healthcare provider to determine what follow-up is needed. You can also schedule telemedicine visits with your primary care physician to discuss ongoing symptoms or new concerns that may arise.     For further information on what you and others in your household should do please visit the Center for Disease Control (CDC) website @ https://www.cdc.gov/coronavirus/2019-ncov/about/steps-when-sick.html     If you are ill and need to be seen by a healthcare provider, and your primary physician is not available, the [facility] are open for walk in visits.     [facility] - open M - F 8 am to 8 pm at the following locations:     [facility]: [address] [place], [place] [address]     [facility]: [address], [place], [place] [address]     Open Daily 8am – 8pm including weekends at the following location:     [facility]: [address], [place], [place] [address]     In addition, all [facility] can care for patients with all conditions, both COVID and non-COVID. Do not hesitate to seek care should you have a healthcare emergency.

## 2020-07-31 NOTE — Progress Notes (Signed)
[facility]  PROGRESS NOTE     Patient: Bill Kaiser   Date: 07/31/2020   MRN: [id number]       Bill Kaiser is a 54 y.o. male      HISTORY     History obtained from: Patient    Chief Complaint   Patient presents with   . Sore Throat     Sx for the last 3 days, taking OTC Ibuprofen 400mg  PRN last taken today.   . Otalgia        HPI     Bill Kaiser is a 54 yo male, he presents to the clinic for c/o illness. 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 Reports 4 day hisotroy of L sided sore throat, L ear pain, HA and fatigue.     Denies fever, body aches, chills, abd pain, N/V/D. [place] the beach - unknown if he was exposed to illness. He is fully vaccinated for COVID - second done Pfizer done in April.     Review of Systems   Constitutional: Positive for fatigue. Negative for appetite change, chills, diaphoresis and fever.   HENT: Positive for ear pain, sore throat and trouble swallowing. Negative for congestion, ear discharge, facial swelling, hearing loss, postnasal drip, rhinorrhea and sinus pressure.    Eyes: Negative for discharge.   Respiratory: Negative for cough, chest tightness, shortness of breath and wheezing.    Cardiovascular: Negative for chest pain.   Gastrointestinal: Negative for abdominal pain, diarrhea, nausea and vomiting.   Musculoskeletal: Negative for arthralgias and myalgias.   Skin: Negative for rash.   Allergic/Immunologic: Negative for environmental allergies.   Neurological: Positive for headaches. Negative for dizziness.   Hematological: Negative for adenopathy.   Psychiatric/Behavioral: Negative for sleep disturbance.       History:    Past Medical History:   Diagnosis Date   . Arthritis     neck, right knee, and arm   . Claustrophobia    . Depression    . Dysphagia     food is sticking for 4 years   . Ear, nose and throat disorder     tinnitis   . Gastric ulceration     ???   . Gastroesophageal reflux disease    . Headache     from neck pain   . Hypoglycemia    . Low back pain    .  Pneumonia     h/o multiple times   . PTSD (post-traumatic stress disorder)      Past Surgical History:   Procedure Laterality Date   . EGD, BIOPSY N/A 08/11/2018    Procedure: EGD, BIOPSY;  Surgeon: Virgina Norfolk, MD;  Location: [facility];  Service: Gastroenterology;  Laterality: N/A;   . EGD, COLONOSCOPY N/A 05/13/2018    Procedure: EGD, COLONOSCOPY WITH BIOPSY;  Surgeon: Virgina Norfolk, MD;  Location: [facility];  Service: Gastroenterology;  Laterality: N/A;   . neck injection      cortisone   . SPLENECTOMY, TOTAL         Pertinent Past Medical, Surgical, Family and Social History were reviewed.        Current Outpatient Medications:   .  Ascorbic Acid (VITAMIN C) 1000 MG tablet, Take 1,000 mg by mouth daily, Disp: , Rfl:   .  b complex vitamins capsule, Take 1 capsule by mouth every other day   , Disp: , Rfl:   .  Cholecalciferol (VITAMIN D3) 3000 units Tab, Take by mouth   ,  Disp: , Rfl:   .  famotidine (PEPCID) 20 MG tablet, Take 1 tablet (20 mg total) by mouth 2 (two) times daily, Disp: 30 tablet, Rfl: 0  .  vitamin A 16109 UNIT capsule, Take 10,000 Units by mouth   , Disp: , Rfl:   .  ALPRAZolam (XANAX) 0.5 MG tablet, Take 1 tablet (0.5 mg total) by mouth 2 (two) times daily as needed for Anxiety, Disp: 30 tablet, Rfl: 0  .  cefdinir (OMNICEF) 300 MG capsule, Take 1 capsule (300 mg total) by mouth 2 (two) times daily for 10 days, Disp: 20 capsule, Rfl: 0  .  VITAMIN K PO, Take by mouth   , Disp: , Rfl:     Allergies   Allergen Reactions   . Penicillins        Medications and Allergies reviewed.    PHYSICAL EXAM     Vitals:    07/31/20 1243   BP: 119/76   Pulse: 75   Resp: 16   Temp: 98.8 F (37.1 C)   TempSrc: Tympanic   SpO2: 99%   Weight: 103.4 kg (228 lb)   Height: 1.905 m (6\' 3" )       Physical Exam  Constitutional:       General: He is not in acute distress.     Appearance: He is well-developed.   HENT:      Head: Normocephalic and atraumatic.      Right Ear: Tympanic membrane and external ear normal.       Left Ear: Tympanic membrane and external ear normal.      Nose: No congestion or rhinorrhea.      Mouth/Throat:      Mouth: Mucous membranes are moist.      Pharynx: Oropharyngeal exudate and posterior oropharyngeal erythema present.      Comments: Tonsils 3+, white drainage noted     Eyes: Conjunctivae are normal. Right conjunctiva is not injected. Left conjunctiva is not injected. No scleral icterus. Cardiovascular:      Rate and Rhythm: Normal rate and regular rhythm.      Heart sounds: Normal heart sounds. No murmur heard.     Pulmonary:      Effort: Pulmonary effort is normal. No respiratory distress.      Breath sounds: Normal breath sounds. No wheezing.   Abdominal:      General: Bowel sounds are normal.      Palpations: Abdomen is soft.   Musculoskeletal:      Cervical back: Normal range of motion and neck supple.   Lymphadenopathy:      Cervical: No cervical adenopathy.   Neurological:      Mental Status: He is alert and oriented to person, place, and time.   Skin:     General: Skin is warm and dry.      Capillary Refill: Capillary refill takes less than 2 seconds.      Findings: No rash.   Vitals and nursing note reviewed.         UCC COURSE     LABS  The following POCT tests were ordered, reviewed and discussed with the patient/family.     Results     Procedure Component Value Units Date/Time    Symptomatic Sofia Antigen [[id number]]  (Normal) Collected: 07/31/20 1331     Updated: 07/31/20 1331     Sofia SARS COV2 Antigen POCT Negative    Mono Screen [[id number]]  (Normal) Collected: 07/31/20 1328     Updated: 07/31/20  1328     POCT QC Pass     POCT Infectious Mono Heterophile Antibodies Negative    Rapid Group A Strep [[id number]]  (Normal) Collected: 07/31/20 1259    Specimen: Throat Updated: 07/31/20 1259     POCT QC Pass     Rapid Strep A Screen POCT Negative      Comment Negative Results should be confirmed by throat Cx to confirm absence of Strep A inf.        No results found.    No current  facility-administered medications for this visit.       PROCEDURES     Procedures    MEDICAL DECISION MAKING     History, physical, labs/studies most consistent with pharyngitis as the diagnosis.    Chart Review:  Prior PCP, Specialist and/or ED notes reviewed today: Not Applicable  Prior labs/images/studies reviewed today: Not Applicable    Differential Diagnosis: strep, mono, covid, viral URI      ASSESSMENT     Encounter Diagnosis   Name Primary?   . Sore throat Yes            PLAN      . We discussed the following plan:  Jonny Ruiz presents to the clinic for c/o sore throat, fatigue and HA x 3 days. VSS, PE normal. Lungs CTA, sats WNL and breathing non labored. Tonsils 3+, uvula inflamed, white exudate noted. Pt reports hx of frequent strep infections.   . Rapid COVID negative - will send PCR  . Rapid Flu negative   . Rapid strep negative - will send throat culture for confirmation  . Due to hx of frequent strep and current PE symptoms - start Cefdinir. Pt has allergy to Amoxicillin. Stop medication if throat culture negative.   . Throat lozenges, hot soup and tea, salt water gargle   . Patient meets criteria for COVID testing  . Discussed COVID test results will return in 1-2 days  . Reviewed self isolation precautions   . Continue to monitor symptoms at home, increase fluids and rest. Tylenol and Motrin for fever.   . To ER if s/s worsen including fever that does not decrease with medication, SOB, wheezing, chest pain, cough.  . FU with concerns      Orders Placed This Encounter   Procedures   . Throat Culture ([place])   . COVID-19 (SARS-CoV-2)   . Rapid Group A Strep   . Mono Screen   . Symptomatic Sofia Antigen     Requested Prescriptions     Signed Prescriptions Disp Refills   . cefdinir (OMNICEF) 300 MG capsule 20 capsule 0     Sig: Take 1 capsule (300 mg total) by mouth 2 (two) times daily for 10 days       Discussed results and diagnosis with patient/family.  Reviewed warning signs for worsening condition, as  well as, indications for follow-up with primary care physician and return to urgent care clinic.   Patient/family expressed understanding of instructions.     An After Visit Summary was provided to the patient.

## 2020-08-01 LAB — COVID-19 (SARS-COV-2): SARS CoV 2 Overall Result: NOT DETECTED

## 2020-10-22 ENCOUNTER — Emergency Department: Payer: 59

## 2020-10-22 ENCOUNTER — Emergency Department
Admission: EM | Admit: 2020-10-22 | Discharge: 2020-10-22 | Disposition: A | Discharge status: Home / Self Care | Payer: 59 | Attending: Emergency Medicine | Admitting: Emergency Medicine

## 2020-10-22 DIAGNOSIS — R600 Localized edema: Secondary | ICD-10-CM | POA: Insufficient documentation

## 2020-10-22 DIAGNOSIS — K0889 Other specified disorders of teeth and supporting structures: Secondary | ICD-10-CM | POA: Insufficient documentation

## 2020-10-22 DIAGNOSIS — R220 Localized swelling, mass and lump, head: Secondary | ICD-10-CM

## 2020-10-22 LAB — COVID-19 (SARS-COV-2): SARS CoV 2 Overall Result: NEGATIVE

## 2020-10-22 MED ORDER — ONDANSETRON 4 MG PO TBDP
4.00 mg | ORAL_TABLET | Freq: Once | ORAL | Status: AC
Start: 2020-10-22 — End: 2020-10-22
  Administered 2020-10-22: 4 mg via ORAL

## 2020-10-22 MED ORDER — CEPHALEXIN 500 MG PO CAPS
500.00 mg | ORAL_CAPSULE | Freq: Once | ORAL | Status: AC
Start: 2020-10-22 — End: 2020-10-22
  Administered 2020-10-22: 500 mg via ORAL

## 2020-10-22 MED ORDER — HYDROCODONE-ACETAMINOPHEN 5-325 MG PO TABS
1.00 | ORAL_TABLET | Freq: Four times a day (QID) | ORAL | 0 refills | Status: AC | PRN
Start: 2020-10-22 — End: 2020-10-25

## 2020-10-22 MED ORDER — CEPHALEXIN 500 MG PO CAPS
500.00 mg | ORAL_CAPSULE | Freq: Two times a day (BID) | ORAL | 0 refills | Status: AC
Start: 2020-10-22 — End: 2020-10-29

## 2020-10-22 MED ORDER — HYDROCODONE-ACETAMINOPHEN 5-325 MG PO TABS
1.00 | ORAL_TABLET | Freq: Once | ORAL | Status: AC
Start: 2020-10-22 — End: 2020-10-22
  Administered 2020-10-22: 1 via ORAL

## 2020-10-22 MED ORDER — NALOXONE HCL 4 MG/0.1ML NA LIQD
NASAL | 0 refills | Status: DC
Start: 2020-10-22 — End: 2021-03-19

## 2020-10-22 MED ORDER — ONDANSETRON 4 MG PO TBDP
4.00 mg | ORAL_TABLET | Freq: Four times a day (QID) | ORAL | 0 refills | Status: AC | PRN
Start: 2020-10-22 — End: 2020-10-27

## 2020-10-22 NOTE — ED Notes (Signed)
Pt awake alert oriented.  No pain distress.  Reviewed discharge instructions.  No questions at this time.  Ambulatory with steady gait to ED exit.

## 2020-10-22 NOTE — ED Provider Notes (Signed)
EMERGENCY DEPARTMENT NOTE     ED PHYSICIAN ASSIGNED     None          ED MIDLEVEL (APP) ASSIGNED     Date/Time Event User Comments    10/22/20 1741 PA/NP Provider Assigned Melburn Popper, Mckaylah Bettendorf P. Toma Aran, FNP assigned as Nurse Practitioner          HISTORY OF PRESENT ILLNESS   Historian:Patient  Translator Used: No    Chief Complaint: Generalized Body Aches     Mechanism of Injury:       54 y.o. male presents for body aches, chills, sneezing for 4 day, back pain where aligns with known nodule (reports feels like that when gets pneumonia); upper and lower right sided wisdom teeth pain.    Reports is vaccinated for covid.     1. Location of symptoms: See above  2. Onset of symptoms: See above  3. What was patient doing when symptoms started (Context): see above  4. Severity: moderate  5. Timing: worsening  6. Activities that worsen symptoms: possibly dental related  7. Activities that improve symptoms:   8. Quality: 5/10 pain   9. Radiation of symptoms: no  10. Associated signs and Symptoms: see above  11. Are symptoms worsening? Yes        MEDICAL HISTORY     Past Medical History:  Past Medical History:   Diagnosis Date   . Arthritis     neck, right knee, and arm   . Claustrophobia    . Depression    . Dysphagia     food is sticking for 4 years   . Ear, nose and throat disorder     tinnitis   . Gastric ulceration     ???   . Gastroesophageal reflux disease    . Headache     from neck pain   . Hypoglycemia    . Low back pain    . Pneumonia     h/o multiple times   . PTSD (post-traumatic stress disorder)        Past Surgical History:  Past Surgical History:   Procedure Laterality Date   . EGD, BIOPSY N/A 08/11/2018    Procedure: EGD, BIOPSY;  Surgeon: Virgina Norfolk, MD;  Location: [facility];  Service: Gastroenterology;  Laterality: N/A;   . EGD, COLONOSCOPY N/A 05/13/2018    Procedure: EGD, COLONOSCOPY WITH BIOPSY;  Surgeon: Virgina Norfolk, MD;  Location: [facility];  Service: Gastroenterology;  Laterality:  N/A;   . neck injection      cortisone   . SPLENECTOMY, TOTAL         Social History:  Social History     Socioeconomic History   . Marital status: Single     Spouse name: Not on file   . Number of children: Not on file   . Years of education: Not on file   . Highest education level: Not on file   Occupational History   . Not on file   Tobacco Use   . Smoking status: Current Some Day Smoker     Types: Cigarettes, Cigars     Last attempt to quit: 03/2018     Years since quitting: 2.5   . Smokeless tobacco: Never Used   . Tobacco comment: vapes   Vaping Use   . Vaping Use: Former   Substance and Sexual Activity   . Alcohol use: Yes   . Drug use: No   . Sexual activity: Yes  Other Topics Concern   . Not on file   Social History Narrative   . Not on file     Social Determinants of Health     Financial Resource Strain:    . Difficulty of Paying Living Expenses: Not on file   Food Insecurity:    . Worried About Programme researcher, broadcasting/film/video in the Last Year: Not on file   . Ran Out of Food in the Last Year: Not on file   Transportation Needs:    . Lack of Transportation (Medical): Not on file   . Lack of Transportation (Non-Medical): Not on file   Physical Activity:    . Days of Exercise per Week: Not on file   . Minutes of Exercise per Session: Not on file   Stress:    . Feeling of Stress : Not on file   Social Connections:    . Frequency of Communication with Friends and Family: Not on file   . Frequency of Social Gatherings with Friends and Family: Not on file   . Attends Religious Services: Not on file   . Active Member of Clubs or Organizations: Not on file   . Attends Banker Meetings: Not on file   . Marital Status: Not on file   Intimate Partner Violence:    . Fear of Current or Ex-Partner: Not on file   . Emotionally Abused: Not on file   . Physically Abused: Not on file   . Sexually Abused: Not on file   Housing Stability:    . Unable to Pay for Housing in the Last Year: Not on file   . Number of Places  Lived in the Last Year: Not on file   . Unstable Housing in the Last Year: Not on file       Family History:  Family History   Problem Relation Age of Onset   . Dementia Mother    . No known problems Father    . No known problems Brother    . Malignant hyperthermia Neg Hx    . Pseudochol deficiency Neg Hx        Outpatient Medication:  Discharge Medication List as of 10/22/2020  7:23 PM      CONTINUE these medications which have NOT CHANGED    Details   ALPRAZolam (XANAX) 0.5 MG tablet Take 1 tablet (0.5 mg total) by mouth 2 (two) times daily as needed for Anxiety, Starting Thu 07/23/2018, Print      Ascorbic Acid (VITAMIN C) 1000 MG tablet Take 1,000 mg by mouth daily, Historical Med      b complex vitamins capsule Take 1 capsule by mouth every other day    , Historical Med      Cholecalciferol (VITAMIN D3) 3000 units Tab Take by mouth    , Historical Med      famotidine (PEPCID) 20 MG tablet Take 1 tablet (20 mg total) by mouth 2 (two) times daily, Starting Mon 03/30/2018, Print      vitamin A 16109 UNIT capsule Take 10,000 Units by mouth    , Historical Med      VITAMIN K PO Take by mouth    , Historical Med               REVIEW OF SYSTEMS   Review of Systems   Constitutional: Positive for chills and malaise/fatigue.   HENT:        Sneezing, dental pain   Musculoskeletal: Positive for  myalgias.   All other systems reviewed and are negative.           PHYSICAL EXAM     ED Triage Vitals [10/22/20 1758]   Enc Vitals Group      BP 118/83      Heart Rate 77      Resp Rate 16      Temp 97.3 F (36.3 C)      Temp Source Temporal      SpO2 100 %      Weight 98.9 kg      Height 1.93 m      Head Circumference       Peak Flow       Pain Score 5      Pain Loc       Pain Edu?       Excl. in GC?      Vitals:    10/22/20 1758 10/22/20 1934   BP: 118/83 132/90   Pulse: 77 78   Resp: 16 16   Temp: 97.3 F (36.3 C)    TempSrc: Temporal    SpO2: 100% 100%   Weight: 98.9 kg    Height: 6\' 4"  (1.93 m)          Physical Exam  Vitals and  nursing note reviewed.   Constitutional:       General: He is not in acute distress.     Appearance: Normal appearance. He is not ill-appearing, toxic-appearing or diaphoretic.   HENT:      Head: Normocephalic and atraumatic.      Right Ear: External ear normal. No mastoid tenderness.      Left Ear: External ear normal. No mastoid tenderness.      Nose: Nose normal. No congestion or rhinorrhea.      Mouth/Throat:      Lips: Pink. No lesions.      Mouth: Mucous membranes are moist. No injury.      Dentition: Abnormal dentition. Does not have dentures. Dental tenderness and dental caries present. No gingival swelling, dental abscesses or gum lesions.      Pharynx: Oropharynx is clear. No oropharyngeal exudate or posterior oropharyngeal erythema.        Comments: Very mild right facial swelling  Eyes:      General: No scleral icterus.        Right eye: No discharge.         Left eye: No discharge.      Conjunctiva/sclera: Conjunctivae normal.   Cardiovascular:      Rate and Rhythm: Normal rate and regular rhythm.      Pulses: Normal pulses.      Heart sounds: No murmur heard.  No friction rub. No gallop.    Pulmonary:      Effort: Pulmonary effort is normal. No respiratory distress.      Breath sounds: No stridor. No wheezing, rhonchi or rales.   Abdominal:      Tenderness: There is no guarding.   Musculoskeletal:         General: No swelling, tenderness, deformity or signs of injury. Normal range of motion.      Cervical back: Normal range of motion and neck supple. No rigidity.   Skin:     General: Skin is warm and dry.      Capillary Refill: Capillary refill takes less than 2 seconds.      Coloration: Skin is not jaundiced or pale.      Findings: No  bruising, erythema or rash.   Neurological:      General: No focal deficit present.      Mental Status: He is alert and oriented to person, place, and time.      Gait: Gait normal.   Psychiatric:         Mood and Affect: Mood normal.         Behavior: Behavior normal.              MEDICAL DECISION MAKING     DISCUSSION    Mask, eye protection, gloves worn.  Little concern for infectious processes requiring CBC at this time.  Little concern for sepsis, airway compromise or obstruction at this time.  Will test for covid, flu.  CXR to evaluate for pneumonia, acute etiology.  Differentials also include but not limited to dental infection, dental pain, other viral illness.   Past charts reviewed.     Chest xray independently read by me/NP, no acute intrathoracic abnormality at this time.     On re-evaluation in NAD, discussed results with patient and family and possible causes for symptoms.  Will treat for possible dental infection.  Patient requesting stronger pain medication, PMP reviewed with no active scripts.    Discussed medication, rest, hydration, importance of follow-up with dentist/given contact list, when to return to the ER.    Prescriptions sent to pharmacy for antibiotic/cephalexin, pain/norco, narcan, zofran if need for nausea/medication side effect.     Patient indicates understanding and agreeable to discharge plan, patient feels safe going home.          The patient is NOT septic.  All labs and vital signs from the current visit have been reviewed and any abnormality that is present is not due to sepsis.    Vital Signs: Reviewed the patient's vital signs.   Nursing Notes: Reviewed and utilized available nursing notes.  Medical Records Reviewed: Reviewed available past medical records.  Counseling: The emergency provider has spoken with the patient and discussed today's findings, in addition to providing specific details for the plan of care.  Questions are answered and there is agreement with the plan.              EMERGENCY IMAGING STUDIES    The following imagine studies were independently interpreted by me (emergency physician):    Radiology:  Interpreted by me (ED NP)  Study: Chest Xray   Results: No infiltrate. No pneumothorax. No hemothorax. No cardiomegaly. No  CHF.  Impression: No acute intrathoracic abnormality.          RADIOLOGY IMAGING STUDIES      XR Chest 2 Views   Final Result       No acute cardiopulmonary disease.       Ida Rogue, MD    10/22/2020 7:12 PM              PULSE OXIMETRY    Oxygen Saturation by Pulse Oximetry: 100%  Interventions: none  Interpretation:  WNL, independently interpreted by me.         EMERGENCY DEPT. MEDICATIONS      ED Medication Orders (From admission, onward)    Start Ordered     Status Ordering Provider    10/22/20 1922 10/22/20 1921  HYDROcodone-acetaminophen (NORCO) 5-325 MG per tablet 1 tablet  Once        Route: Oral  Ordered Dose: 1 tablet     Last MAR action: Given Toma Aran    10/22/20 1922 10/22/20  1921  cephalexin (KEFLEX) capsule 500 mg  Once        Route: Oral  Ordered Dose: 500 mg     Last MAR action: Given Toma Aran    10/22/20 1922 10/22/20 1921  ondansetron (ZOFRAN-ODT) disintegrating tablet 4 mg  Once        Route: Oral  Ordered Dose: 4 mg     Last MAR action: Given Mariame Rybolt P              LABORATORY RESULTS    Ordered and independently interpreted AVAILABLE laboratory tests. Please see results section in chart for full details.  Results for orders placed or performed during the hospital encounter of 10/22/20   COVID-19 (SARS-COV-2) ([facility])    Specimen: Nasopharynx; Nasopharyngeal Swab   Result Value Ref Range    Purpose of COVID testing Screening     SARS-CoV-2 Specimen Source Nasopharyngeal     SARS CoV 2 Overall Result Negative            CRITICAL CARE/PROCEDURES    Procedures  None        DIAGNOSIS      Diagnosis:  Final diagnoses:   Right facial swelling   Pain, dental       Disposition:  ED Disposition     ED Disposition Condition Date/Time Comment    Discharge  Sun Oct 22, 2020  7:23 PM [name] discharge to home/self care.    Condition at disposition: Stable          Prescriptions:  Discharge Medication List as of 10/22/2020  7:23 PM      START taking these  medications    Details   cephalexin (KEFLEX) 500 MG capsule Take 1 capsule (500 mg total) by mouth 2 (two) times daily for 7 days, Starting Sun 10/22/2020, Until Sun 10/29/2020, E-Rx      HYDROcodone-acetaminophen (NORCO) 5-325 MG per tablet Take 1 tablet by mouth every 6 (six) hours as needed for Pain, Starting Sun 10/22/2020, Until Wed 10/25/2020 at 2359, E-Rx      naloxone (NARCAN) 4 MG/0.1ML nasal spray 1 spray intranasally. If pt does not respond or relapses into respiratory depression call 911. Give additional doses every 2-3 min., E-Rx      ondansetron (ZOFRAN-ODT) 4 MG disintegrating tablet Take 1 tablet (4 mg total) by mouth every 6 (six) hours as needed for Nausea, Starting Sun 10/22/2020, Until Fri 10/27/2020 at 2359, E-Rx         CONTINUE these medications which have NOT CHANGED    Details   ALPRAZolam (XANAX) 0.5 MG tablet Take 1 tablet (0.5 mg total) by mouth 2 (two) times daily as needed for Anxiety, Starting Thu 07/23/2018, Print      Ascorbic Acid (VITAMIN C) 1000 MG tablet Take 1,000 mg by mouth daily, Historical Med      b complex vitamins capsule Take 1 capsule by mouth every other day    , Historical Med      Cholecalciferol (VITAMIN D3) 3000 units Tab Take by mouth    , Historical Med      famotidine (PEPCID) 20 MG tablet Take 1 tablet (20 mg total) by mouth 2 (two) times daily, Starting Mon 03/30/2018, Print      vitamin A 08657 UNIT capsule Take 10,000 Units by mouth    , Historical Med      VITAMIN K PO Take by mouth    , Historical Med  Toma Aran, FNP  10/22/20 2212

## 2020-10-22 NOTE — ED Triage Notes (Signed)
Reports sneezing and body aches past 4 days with intermittent pain left lower scapular that in past history was orthopedic in nature but has worsened past 4 days.

## 2020-10-22 NOTE — Discharge Instructions (Signed)
Take medication as prescribed. Rest, keep hydrated.  Very important to follow-up with a dentist, we listed some contacts, call tomorrow.  Return to the ER with any worsening of condition or concerns.

## 2021-03-15 ENCOUNTER — Emergency Department
Admission: EM | Admit: 2021-03-15 | Discharge: 2021-03-15 | Disposition: A | Discharge status: Home / Self Care | Payer: 59 | Attending: Emergency Medicine | Admitting: Emergency Medicine

## 2021-03-15 DIAGNOSIS — D235 Other benign neoplasm of skin of trunk: Secondary | ICD-10-CM | POA: Insufficient documentation

## 2021-03-15 DIAGNOSIS — F419 Anxiety disorder, unspecified: Secondary | ICD-10-CM

## 2021-03-15 DIAGNOSIS — F418 Other specified anxiety disorders: Secondary | ICD-10-CM | POA: Insufficient documentation

## 2021-03-15 DIAGNOSIS — F17210 Nicotine dependence, cigarettes, uncomplicated: Secondary | ICD-10-CM | POA: Insufficient documentation

## 2021-03-15 DIAGNOSIS — Z5329 Procedure and treatment not carried out because of patient's decision for other reasons: Secondary | ICD-10-CM | POA: Insufficient documentation

## 2021-03-15 DIAGNOSIS — F32A Depression, unspecified: Secondary | ICD-10-CM

## 2021-03-15 HISTORY — DX: Anxiety disorder, unspecified: F41.9

## 2021-03-15 NOTE — ED Notes (Signed)
Pt refused EKG.

## 2021-03-15 NOTE — ED Notes (Signed)
Pt left without discharge instructions.

## 2021-03-15 NOTE — Discharge Instructions (Signed)
You were seen today to discuss your panic attack yesterday as well as to be evaluated for a cyst on your back.   Unfortunately due to a long wait time, you did not want to stay for a complete assessment to make certain there was not cardiac component to your panic attack-like episode that occurred yesterday and we are unable to remove your cyst on your back in the emergency department setting.   I would recommend follow up with a new primary care provider. You will need to discuss restarting antidepressants with a mental health provider.   Your cyst should be evaluated by a dermatologist.   I apologize for the long wait, I wish you the best of health.
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
## 2021-03-15 NOTE — ED Notes (Signed)
Cyst on back states got bigger recently

## 2021-03-17 DIAGNOSIS — K219 Gastro-esophageal reflux disease without esophagitis: Secondary | ICD-10-CM | POA: Insufficient documentation

## 2021-03-17 DIAGNOSIS — F4310 Post-traumatic stress disorder, unspecified: Secondary | ICD-10-CM | POA: Insufficient documentation

## 2021-03-17 DIAGNOSIS — F339 Major depressive disorder, recurrent, unspecified: Secondary | ICD-10-CM | POA: Insufficient documentation

## 2021-03-17 DIAGNOSIS — R222 Localized swelling, mass and lump, trunk: Secondary | ICD-10-CM | POA: Insufficient documentation

## 2021-03-17 DIAGNOSIS — R221 Localized swelling, mass and lump, neck: Secondary | ICD-10-CM | POA: Insufficient documentation

## 2021-03-17 DIAGNOSIS — M47816 Spondylosis without myelopathy or radiculopathy, lumbar region: Secondary | ICD-10-CM | POA: Insufficient documentation

## 2021-03-19 ENCOUNTER — Encounter (INDEPENDENT_AMBULATORY_CARE_PROVIDER_SITE_OTHER): Payer: Self-pay | Admitting: Family Medicine

## 2021-03-19 ENCOUNTER — Ambulatory Visit (INDEPENDENT_AMBULATORY_CARE_PROVIDER_SITE_OTHER): Payer: 59 | Admitting: Family Medicine

## 2021-03-19 VITALS — BP 121/82 | HR 79 | Temp 98.6°F | Resp 14 | Ht 74.0 in | Wt 235.0 lb

## 2021-03-19 DIAGNOSIS — Z7689 Persons encountering health services in other specified circumstances: Secondary | ICD-10-CM

## 2021-03-19 DIAGNOSIS — E66811 Obesity, class 1: Secondary | ICD-10-CM

## 2021-03-19 DIAGNOSIS — K219 Gastro-esophageal reflux disease without esophagitis: Secondary | ICD-10-CM

## 2021-03-19 DIAGNOSIS — F4310 Post-traumatic stress disorder, unspecified: Secondary | ICD-10-CM

## 2021-03-19 DIAGNOSIS — Z1212 Encounter for screening for malignant neoplasm of rectum: Secondary | ICD-10-CM

## 2021-03-19 DIAGNOSIS — M5412 Radiculopathy, cervical region: Secondary | ICD-10-CM

## 2021-03-19 DIAGNOSIS — R221 Localized swelling, mass and lump, neck: Secondary | ICD-10-CM

## 2021-03-19 DIAGNOSIS — L819 Disorder of pigmentation, unspecified: Secondary | ICD-10-CM

## 2021-03-19 DIAGNOSIS — M47816 Spondylosis without myelopathy or radiculopathy, lumbar region: Secondary | ICD-10-CM

## 2021-03-19 DIAGNOSIS — Z87891 Personal history of nicotine dependence: Secondary | ICD-10-CM

## 2021-03-19 DIAGNOSIS — Z9081 Acquired absence of spleen: Secondary | ICD-10-CM

## 2021-03-19 DIAGNOSIS — Z1211 Encounter for screening for malignant neoplasm of colon: Secondary | ICD-10-CM

## 2021-03-19 DIAGNOSIS — E669 Obesity, unspecified: Secondary | ICD-10-CM

## 2021-03-19 DIAGNOSIS — F332 Major depressive disorder, recurrent severe without psychotic features: Secondary | ICD-10-CM

## 2021-03-19 MED ORDER — ALPRAZOLAM 0.5 MG PO TABS
0.5000 mg | ORAL_TABLET | Freq: Two times a day (BID) | ORAL | 0 refills | Status: DC | PRN
Start: 2021-03-19 — End: 2021-04-24

## 2021-03-19 MED ORDER — SERTRALINE HCL 25 MG PO TABS
25.0000 mg | ORAL_TABLET | Freq: Every day | ORAL | 0 refills | Status: DC
Start: 2021-03-19 — End: 2021-04-30

## 2021-03-19 NOTE — Progress Notes (Signed)
[facility]                       Date of Exam: 03/19/2021 5:41 AM        Patient ID: Bill Kaiser is a 55 y.o. male.  Attending Physician: Gillermo Murdoch, MD        Chief Complaint:    Chief Complaint   Patient presents with   . Establish Care     Not fasting         Assessment and Plan:    Problem List Items Addressed This Visit        Digestive    Gastroesophageal reflux disease without esophagitis    Overview     EGD 04/2018 EGD 04/2018, Dr. Romie Minus           Current Assessment & Plan     Currently using Pepcid with no reported concerning symptoms at this time.  Preventive measures discussed.              Nervous and Auditory    Cervical radiculopathy    Overview     CT cervical spine 04/2020           Current Assessment & Plan     CT cervical spine noted from May 2021 for degenerative joint disease.  Given previous history of intra-articular steroid injection and recent worsening of symptoms will refer to pain management. Detailed discussion done about red flag signs and to seek medical attention right away, patient verbalized understanding.  Patient agreeable with plan, all questions answered.             Relevant Medications    sertraline (Zoloft) 25 MG tablet    ALPRAZolam (Xanax) 0.5 MG tablet    Other Relevant Orders    Ambulatory referral to Pain Clinic       Musculoskeletal and Integument    Osteoarthritis of lumbar spine    Current Assessment & Plan     Known to have lumbar degenerative disease with no reported concerning symptoms at this time.  Discussed about maintaining good posture and back exercises including stretches.           Relevant Orders    Ambulatory referral to Pain Clinic    Pigmented skin lesions    Current Assessment & Plan     Will refer to dermatologist for comprehensive skin exam.           Relevant Orders    Referral to Dermatology - EXTERNAL       Other    Recurrent major depressive disorder - Primary    Current Assessment & Plan     PHQ score 12  and gad-7 score 17 with no reported SI/HI.  Given concerns for insomnia will start patient on sertraline 25 mg and prescribe Xanax to be used as needed for anxiety as needed.  Referral given to psychiatrist.  Medication side effects and safety profile discussed.  PMP reviewed.  In office EKG done showing normal QTC.  Detailed discussion done about red flag signs eluding SI/HI counseling and supportive care provided.  Will reevaluate in 4 to 6 weeks.           Relevant Medications    sertraline (Zoloft) 25 MG tablet    ALPRAZolam (Xanax) 0.5 MG tablet    Other Relevant Orders    ECG 12 lead (Completed)    Ambulatory referral to Psychiatry    PTSD (post-traumatic stress disorder)  Current Assessment & Plan     Will start on sertraline and prescribe benzodiazepine to be used as needed.           Relevant Medications    sertraline (Zoloft) 25 MG tablet    ALPRAZolam (Xanax) 0.5 MG tablet    Other Relevant Orders    Ambulatory referral to Psychiatry    Neck mass    Current Assessment & Plan     Suspect sebaceous cyst.  Given increase in size and current symptoms will refer to dermatologist to consider surgical intervention. Detailed discussion done about red flag signs and to seek medical attention right away, patient verbalized understanding.  Patient agreeable with plan, all questions answered.             Relevant Orders    Referral to Dermatology - EXTERNAL    Former smoker    Overview     40 pack years, quit 2019           Current Assessment & Plan     No previous history of low-dose lung CT and would like to hold off at this time.  Will readdress at follow-up.           Screening for colorectal cancer    Current Assessment & Plan     Most recent colonoscopy 04/2018 noted for polyps.  Due for repeat in 5 years.           Establishing care with new doctor, encounter for    Current Assessment & Plan     Patient had establish care, will order labs and readdress care gaps at follow-up           H/O asplenia    Current  Assessment & Plan     Discussed about the importance of meningococcal, pneumococcal and yearly influenza vaccine.  Patient would like to hold off for now, will readdress at follow-up.           Obesity (BMI 30.0-34.9)    Current Assessment & Plan     Discussed about lifestyle changes including diet and exercise.                        Problem List:    Patient Active Problem List   Diagnosis   . Recurrent major depressive disorder   . PTSD (post-traumatic stress disorder)   . Gastroesophageal reflux disease without esophagitis   . Osteoarthritis of lumbar spine   . Neck mass   . Pigmented skin lesions   . Cervical radiculopathy   . Former smoker   . Screening for colorectal cancer   . Establishing care with new doctor, encounter for   . H/O asplenia   . Obesity (BMI 30.0-34.9)             Current Meds:    Outpatient Medications Marked as Taking for the 03/19/21 encounter (Office Visit) with Gillermo Murdoch, MD   Medication Sig Dispense Refill   . Ascorbic Acid (VITAMIN C) 1000 MG tablet Take 1,000 mg by mouth daily     . b complex vitamins capsule Take 1 capsule by mouth every other day         . Cholecalciferol (VITAMIN D3) 3000 units Tab Take by mouth         . famotidine (PEPCID) 20 MG tablet Take 1 tablet (20 mg total) by mouth 2 (two) times daily 30 tablet 0   . vitamin A 16109 UNIT capsule Take  10,000 Units by mouth         . VITAMIN K PO Take by mouth                Allergies:    Allergies   Allergen Reactions   . Penicillins              Past Surgical History:    Past Surgical History:   Procedure Laterality Date   . EGD, BIOPSY N/A 08/11/2018    Procedure: EGD, BIOPSY;  Surgeon: Virgina Norfolk, MD;  Location: [facility];  Service: Gastroenterology;  Laterality: N/A;   . EGD, COLONOSCOPY N/A 05/13/2018    Procedure: EGD, COLONOSCOPY WITH BIOPSY;  Surgeon: Virgina Norfolk, MD;  Location: [facility];  Service: Gastroenterology;  Laterality: N/A;   . neck injection      cortisone   . SPLENECTOMY, TOTAL             Family  History:    Family History   Problem Relation Age of Onset   . Dementia Mother    . No known problems Father    . No known problems Brother    . Malignant hyperthermia Neg Hx    . Pseudochol deficiency Neg Hx            Social History:    Social History     Tobacco Use   . Smoking status: Current Some Day Smoker     Types: Cigarettes, Cigars     Last attempt to quit: 03/2018     Years since quitting: 2.9   . Smokeless tobacco: Never Used   . Tobacco comment: vapes   Vaping Use   . Vaping Use: Former   Substance Use Topics   . Alcohol use: Not Currently   . Drug use: No           The following sections were reviewed this encounter by the provider:   Tobacco  Allergies  Meds  Problems  Med Hx  Surg Hx  Fam Hx               HPI:    Patient is here to establish care.  Patient has not seen a primary care physician in a few months.  Was most recently seen in the emergency room on March 17 for a panic attack as well as concerns for a mass at the back of his neck that is getting bigger.  ER records reviewed.  Patient reports at the day of his ER visit he was experiencing heavy breathing, felt sick to his stomach, was having dizzy spell as well as blurry vision.  Reports being under a lot of stress particularly that day.  Patient reports he was told that he could not be prescribed any medication for anxiety or panic attack have to follow-up with his primary care physician.  Patient reports he is known to have neck and lower back issues for years.  Has previously received injections in his neck more than 25 years ago that helped somewhat but has been having worsening of neck pain with a lot of muscle spasm.  Patient reports he is known to have depression, anxiety as well as PTSD.  Was following up with therapist up until 6 months ago and was undergoing cognitive behavioral therapy including biofeedback and was paying out of pocket as insurance would not cover it.  Patient reports he has been under a lot of stress  recently given his mother's health as she  has been recently diagnosed with a stroke.  Patient reports he is also having issues with sleep which has been worsening over the past couple of weeks.  Patient reports he is fall asleep but wakes up after a few hours not able to go back to sleep.  Denies any snoring, witnessed apneic spells or increased daytime sleepiness.  Patient does report that he was seen by [facility] a few years ago and was prescribed sleep aids that helped and would like to consider dose at this time.  Patient reports he is aware of the addictive nature has had issues with alcohol and would like to use them only as needed.  Patient reports he has not had alcohol in more than 6 months.  Denies any other recreational or prescription drug use.  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  Last EGD and colonoscopy was done in May 2019 and was advised to follow-up within 5 years.  Patient has previous history of spleen removal and has not had any recent vaccination including influenza and would like to hold off for now.  Does report receiving his COVID vaccine.  Patient also reports that he is concerned about a small mass at the back of his neck that has been getting bigger over the past 1 year.  Patient has had the lesion for more than 20 years now.  Patient reports he tried to lance it himself but nothing came out.  Patient has seen dermatologist a few years ago and had skin tag removed from his forehead but no previous history of precancerous or cancerous skin conditions.  He has previous history of smoking, reports started at the age of 9 years but quit in 2019.Marland Kitchen  Has not had previous low-dose lung CT done and would like to hold off at this time.                       Vital Signs:    BP 121/82 (BP Site: Right arm, Patient Position: Sitting, Cuff Size: Medium)   Pulse 79   Temp 98.6 F (37 C) (Temporal)    Resp 14   Ht 1.88 m (6\' 2" )   Wt 106.6 kg (235 lb)   SpO2 98%   BMI 30.17 kg/m          ROS:    Review of Systems   Constitutional: Negative for appetite change, chills, diaphoresis, fatigue and fever.   HENT: Negative for congestion, ear pain, mouth sores, postnasal drip, sinus pressure and trouble swallowing.    Eyes: Negative for photophobia, pain and visual disturbance.   Respiratory: Negative for cough, chest tightness, shortness of breath, wheezing and stridor.    Cardiovascular: Negative for chest pain, palpitations and leg swelling.   Gastrointestinal: Negative for abdominal distention, abdominal pain, blood in stool, constipation, diarrhea, nausea and vomiting.   Endocrine: Negative for polydipsia, polyphagia and polyuria.   Genitourinary: Negative for decreased urine volume, difficulty urinating, dysuria, flank pain, frequency and urgency.   Musculoskeletal: Positive for arthralgias, myalgias and neck pain. Negative for back pain, gait problem and joint swelling.   Skin: Positive for wound. Negative for color change and rash.   Allergic/Immunologic: Negative for environmental allergies and food allergies.   Neurological: Negative for dizziness, tremors, syncope, weakness, light-headedness, numbness and headaches.   Hematological: Negative for adenopathy. Does not bruise/bleed easily.   Psychiatric/Behavioral:  Positive for dysphoric mood and sleep disturbance. Negative for confusion and decreased concentration. The patient is nervous/anxious.               Physical Exam:    Physical Exam  Constitutional:       General: He is awake.      Appearance: Normal appearance. He is well-developed. He is obese.   HENT:      Head: Normocephalic and atraumatic.      Right Ear: External ear normal.      Left Ear: External ear normal.      Nose: No nasal tenderness, mucosal edema, congestion or rhinorrhea.      Right Turbinates: Not enlarged or swollen.      Left Turbinates: Not enlarged or swollen.      Right  Sinus: No maxillary sinus tenderness or frontal sinus tenderness.      Left Sinus: No maxillary sinus tenderness or frontal sinus tenderness.      Mouth/Throat:      Lips: Pink.      Mouth: Mucous membranes are moist. No oral lesions.      Dentition: Normal dentition. Does not have dentures. No dental caries or gum lesions.      Tongue: No lesions.      Palate: No mass.      Pharynx: Oropharynx is clear. Uvula midline. No oropharyngeal exudate or posterior oropharyngeal erythema.      Comments: Unable to fully visualize the posterior pharynx.  Eyes:      General: Lids are normal.         Right eye: No discharge.         Left eye: No discharge.      Conjunctiva/sclera: Conjunctivae normal.   Neck:      Thyroid: No thyroid mass or thyroid tenderness.      Vascular: No carotid bruit.      Trachea: Trachea normal.   Cardiovascular:      Rate and Rhythm: Normal rate and regular rhythm.      Pulses:           Radial pulses are 2+ on the right side and 2+ on the left side.      Heart sounds: Normal heart sounds, S1 normal and S2 normal. No murmur heard.  Pulmonary:      Effort: Pulmonary effort is normal.      Breath sounds: Normal breath sounds and air entry. No decreased breath sounds, wheezing, rhonchi or rales.   Abdominal:      General: Abdomen is protuberant. Bowel sounds are normal.      Palpations: Abdomen is soft. There is no hepatomegaly, splenomegaly or mass.      Tenderness: There is no abdominal tenderness. There is no right CVA tenderness or left CVA tenderness.   Musculoskeletal:      Cervical back: Spasms present. No rigidity, tenderness or bony tenderness. Muscular tenderness present. No spinous process tenderness. Normal range of motion.      Thoracic back: Normal. No spasms, tenderness or bony tenderness.      Lumbar back: Normal. No spasms, tenderness or bony tenderness.      Right lower leg: No edema.      Left lower leg: No edema.   Lymphadenopathy:      Cervical: No cervical adenopathy.   Skin:      Findings: Lesion present.             Comments: A small approximately 2 x 2 centimeter swelling  noted on the area marked in the figure above with central umbilication (?  Closed comedones) with tenderness to palpation and erythema of the overlying skin but no active discharge.  Multiple pigmented skin lesions noted throughout.   Neurological:      Mental Status: He is alert.   Psychiatric:         Attention and Perception: Attention and perception normal.         Mood and Affect: Mood and affect normal.         Speech: Speech normal.         Behavior: Behavior normal. Behavior is cooperative.         Thought Content: Thought content normal. Thought content does not include homicidal or suicidal ideation.      Comments: PHQ score 12.  Gad-7 score 17.            PHQ    Little interest or pleasure in doing things: Nearly every day (03/19/2021  1:49 PM)  Feeling down, depressed, or hopeless: Nearly every day (03/19/2021  1:49 PM)  Trouble falling or staying asleep, or sleeping too much: More than half the days (03/19/2021  1:49 PM)  Feeling tired or having little energy: More than half the days (03/19/2021  1:49 PM)  Poor appetite or overeating: Not at all (03/19/2021  1:49 PM)  Feeling bad about yourself - or that you are a failure or have let yourself or your family down: Not at all (03/19/2021  1:49 PM)  Trouble concentrating on things, such as reading the newspaper or watching television: Not at all (03/19/2021  1:49 PM)  Moving or speaking so slowly that other people could have noticed. Or the opposite - being so fidgety or restless that you have been moving around a lot more than usual: More than half the days (03/19/2021  1:49 PM)  Thoughts that you would be better off dead, or of hurting yourself in some way: Not at all (03/19/2021  1:49 PM)  PHQ Total Score: 12 (03/19/2021  1:49 PM)         EKG    EKG: normal EKG, normal sinus rhythm, unchanged from previous tracings.     Follow-up:    Return in about 4 weeks (around  04/16/2021) for annual exam, depression, lab follow up.         Gillermo Murdoch, MD           This note was generated using voice recognition software which may contain typographical, grammatical and word choice errors.

## 2021-03-20 ENCOUNTER — Encounter (INDEPENDENT_AMBULATORY_CARE_PROVIDER_SITE_OTHER): Payer: Self-pay | Admitting: Family Medicine

## 2021-03-20 ENCOUNTER — Other Ambulatory Visit (INDEPENDENT_AMBULATORY_CARE_PROVIDER_SITE_OTHER): Payer: Self-pay | Admitting: Family Medicine

## 2021-03-20 ENCOUNTER — Encounter (INDEPENDENT_AMBULATORY_CARE_PROVIDER_SITE_OTHER): Payer: Self-pay

## 2021-03-20 DIAGNOSIS — Z6830 Body mass index (BMI) 30.0-30.9, adult: Secondary | ICD-10-CM | POA: Insufficient documentation

## 2021-03-20 DIAGNOSIS — E663 Overweight: Secondary | ICD-10-CM | POA: Insufficient documentation

## 2021-03-20 DIAGNOSIS — Z1212 Encounter for screening for malignant neoplasm of rectum: Secondary | ICD-10-CM | POA: Insufficient documentation

## 2021-03-20 DIAGNOSIS — E669 Obesity, unspecified: Secondary | ICD-10-CM | POA: Insufficient documentation

## 2021-03-20 DIAGNOSIS — Z1211 Encounter for screening for malignant neoplasm of colon: Secondary | ICD-10-CM | POA: Insufficient documentation

## 2021-03-20 DIAGNOSIS — Z7689 Persons encountering health services in other specified circumstances: Secondary | ICD-10-CM | POA: Insufficient documentation

## 2021-03-20 DIAGNOSIS — Z87891 Personal history of nicotine dependence: Secondary | ICD-10-CM | POA: Insufficient documentation

## 2021-03-20 DIAGNOSIS — Z9081 Acquired absence of spleen: Secondary | ICD-10-CM | POA: Insufficient documentation

## 2021-03-20 NOTE — Assessment & Plan Note (Signed)
Will refer to dermatologist for comprehensive skin exam.

## 2021-03-20 NOTE — Assessment & Plan Note (Signed)
Discussed about the importance of meningococcal, pneumococcal and yearly influenza vaccine.  Patient would like to hold off for now, will readdress at follow-up.

## 2021-03-20 NOTE — Assessment & Plan Note (Signed)
Will start on sertraline and prescribe benzodiazepine to be used as needed.

## 2021-03-20 NOTE — Assessment & Plan Note (Signed)
Known to have lumbar degenerative disease with no reported concerning symptoms at this time.  Discussed about maintaining good posture and back exercises including stretches.

## 2021-03-20 NOTE — Assessment & Plan Note (Signed)
Currently using Pepcid with no reported concerning symptoms at this time.  Preventive measures discussed.

## 2021-03-20 NOTE — Assessment & Plan Note (Signed)
Patient had establish care, will order labs and readdress care gaps at follow-up

## 2021-03-20 NOTE — Assessment & Plan Note (Signed)
CT cervical spine noted from May 2021 for degenerative joint disease.  Given previous history of intra-articular steroid injection and recent worsening of symptoms will refer to pain management. Detailed discussion done about red flag signs and to seek medical attention right away, patient verbalized understanding.  Patient agreeable with plan, all questions answered.

## 2021-03-20 NOTE — Assessment & Plan Note (Signed)
Suspect sebaceous cyst.  Given increase in size and current symptoms will refer to dermatologist to consider surgical intervention. Detailed discussion done about red flag signs and to seek medical attention right away, patient verbalized understanding.  Patient agreeable with plan, all questions answered.

## 2021-03-20 NOTE — Assessment & Plan Note (Signed)
Most recent colonoscopy 04/2018 noted for polyps.  Due for repeat in 5 years.

## 2021-03-20 NOTE — Assessment & Plan Note (Signed)
Discussed about lifestyle changes including diet and exercise.

## 2021-03-20 NOTE — Assessment & Plan Note (Signed)
No previous history of low-dose lung CT and would like to hold off at this time.  Will readdress at follow-up.

## 2021-03-20 NOTE — Assessment & Plan Note (Addendum)
PHQ score 12 and gad-7 score 17 with no reported SI/HI.  Given concerns for insomnia will start patient on sertraline 25 mg and prescribe Xanax to be used as needed for anxiety as needed.  Referral given to psychiatrist.  Medication side effects and safety profile discussed.  PMP reviewed.  In office EKG done showing normal QTC.  Detailed discussion done about red flag signs eluding SI/HI counseling and supportive care provided.  Will reevaluate in 4 to 6 weeks.

## 2021-03-23 ENCOUNTER — Encounter (INDEPENDENT_AMBULATORY_CARE_PROVIDER_SITE_OTHER): Payer: Self-pay

## 2021-03-23 ENCOUNTER — Telehealth (INDEPENDENT_AMBULATORY_CARE_PROVIDER_SITE_OTHER): Payer: Self-pay | Admitting: Family Medicine

## 2021-03-23 DIAGNOSIS — Z1212 Encounter for screening for malignant neoplasm of rectum: Secondary | ICD-10-CM

## 2021-03-23 DIAGNOSIS — K219 Gastro-esophageal reflux disease without esophagitis: Secondary | ICD-10-CM

## 2021-03-23 DIAGNOSIS — Z1211 Encounter for screening for malignant neoplasm of colon: Secondary | ICD-10-CM

## 2021-03-23 NOTE — Telephone Encounter (Signed)
Referral placed, thank you

## 2021-03-23 NOTE — Telephone Encounter (Signed)
Please see below.

## 2021-03-23 NOTE — Telephone Encounter (Signed)
Referral sent thru mychart.

## 2021-03-23 NOTE — Telephone Encounter (Signed)
Patient called requesting a referral for [facility] doctor. He would like to go to [facility] for Dr. Einar Grad, M.D. located at [address] [address] Need to be faxed to [phone]  Thank you

## 2021-03-28 ENCOUNTER — Ambulatory Visit: Payer: 59 | Attending: Family Medicine

## 2021-03-28 DIAGNOSIS — M4854XA Collapsed vertebra, not elsewhere classified, thoracic region, initial encounter for fracture: Secondary | ICD-10-CM | POA: Insufficient documentation

## 2021-03-28 DIAGNOSIS — Z87891 Personal history of nicotine dependence: Secondary | ICD-10-CM | POA: Insufficient documentation

## 2021-03-28 DIAGNOSIS — J9811 Atelectasis: Secondary | ICD-10-CM | POA: Insufficient documentation

## 2021-03-28 DIAGNOSIS — J479 Bronchiectasis, uncomplicated: Secondary | ICD-10-CM | POA: Insufficient documentation

## 2021-03-30 ENCOUNTER — Encounter (INDEPENDENT_AMBULATORY_CARE_PROVIDER_SITE_OTHER): Payer: Self-pay | Admitting: Family Medicine

## 2021-03-30 ENCOUNTER — Telehealth (INDEPENDENT_AMBULATORY_CARE_PROVIDER_SITE_OTHER): Payer: Self-pay | Admitting: Family Medicine

## 2021-03-30 NOTE — Telephone Encounter (Signed)
Pt called regarding Ct result and requesting a return call to discuss.

## 2021-03-30 NOTE — Telephone Encounter (Signed)
See below

## 2021-03-30 NOTE — Telephone Encounter (Signed)
Called the patient and left a voicemail to call back.  Also sent a MyChart message regarding the results of his low-dose lung CT.  I see that he is scheduled to see me on 04/17/2021, I will be happy to discuss his results further at that time.  Please let me know if patient has any questions or concerns.    Thank you.

## 2021-04-17 ENCOUNTER — Ambulatory Visit (INDEPENDENT_AMBULATORY_CARE_PROVIDER_SITE_OTHER): Payer: 59 | Admitting: Family Medicine

## 2021-04-24 ENCOUNTER — Ambulatory Visit (INDEPENDENT_AMBULATORY_CARE_PROVIDER_SITE_OTHER): Payer: 59 | Admitting: Physician Assistant

## 2021-04-24 ENCOUNTER — Encounter (INDEPENDENT_AMBULATORY_CARE_PROVIDER_SITE_OTHER): Payer: Self-pay | Admitting: Physician Assistant

## 2021-04-24 ENCOUNTER — Encounter (INDEPENDENT_AMBULATORY_CARE_PROVIDER_SITE_OTHER): Payer: Self-pay

## 2021-04-24 ENCOUNTER — Telehealth (INDEPENDENT_AMBULATORY_CARE_PROVIDER_SITE_OTHER): Payer: Self-pay | Admitting: Family Medicine

## 2021-04-24 VITALS — BP 117/79 | HR 65 | Temp 98.1°F | Ht 75.0 in | Wt 236.4 lb

## 2021-04-24 DIAGNOSIS — M542 Cervicalgia: Secondary | ICD-10-CM

## 2021-04-24 DIAGNOSIS — M5030 Other cervical disc degeneration, unspecified cervical region: Secondary | ICD-10-CM

## 2021-04-24 DIAGNOSIS — F332 Major depressive disorder, recurrent severe without psychotic features: Secondary | ICD-10-CM

## 2021-04-24 DIAGNOSIS — F4310 Post-traumatic stress disorder, unspecified: Secondary | ICD-10-CM

## 2021-04-24 DIAGNOSIS — M546 Pain in thoracic spine: Secondary | ICD-10-CM

## 2021-04-24 DIAGNOSIS — G8929 Other chronic pain: Secondary | ICD-10-CM

## 2021-04-24 DIAGNOSIS — M5412 Radiculopathy, cervical region: Secondary | ICD-10-CM

## 2021-04-24 MED ORDER — ALPRAZOLAM 0.5 MG PO TABS
0.5000 mg | ORAL_TABLET | Freq: Two times a day (BID) | ORAL | 0 refills | Status: DC | PRN
Start: 2021-04-24 — End: 2021-06-06
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
## 2021-04-24 NOTE — Telephone Encounter (Signed)
See below

## 2021-04-24 NOTE — Telephone Encounter (Signed)
Last refill 03/19/21 for 30 tablets to be taken as needed  Last o/v 03/19/21  Next appt 04/30/21

## 2021-04-24 NOTE — Progress Notes (Signed)
Review of Systems   Review of Systems   Constitutional: Negative.    HENT: Positive for tinnitus.    Eyes: Positive for photophobia.   Respiratory: Positive for cough and shortness of breath.    Cardiovascular: Negative.    Gastrointestinal: Negative.    Endocrine: Negative.    Genitourinary: Negative.    Musculoskeletal: Positive for arthralgias, joint swelling, myalgias, neck pain and neck stiffness.        Fractures   Skin: Negative.    Allergic/Immunologic: Negative.    Neurological: Positive for tremors, numbness and headaches.        Pins and needles, tingling, burning   Hematological: Negative.    Psychiatric/Behavioral: Positive for decreased concentration and dysphoric mood. The patient is nervous/anxious.    All other systems reviewed and are negative.      Taken by: Cleda Clarks, LPN

## 2021-04-24 NOTE — Telephone Encounter (Signed)
Patient called to request refill for ALPRAZolam (Xanax) 0.5 MG tablet to [id number] - [place] [address], [place] - [address]

## 2021-04-30 ENCOUNTER — Encounter (INDEPENDENT_AMBULATORY_CARE_PROVIDER_SITE_OTHER): Payer: 59 | Admitting: Family Medicine

## 2021-04-30 ENCOUNTER — Ambulatory Visit (FREE_STANDING_LABORATORY_FACILITY): Payer: 59 | Admitting: Student in an Organized Health Care Education/Training Program

## 2021-04-30 ENCOUNTER — Encounter (INDEPENDENT_AMBULATORY_CARE_PROVIDER_SITE_OTHER): Payer: Self-pay | Admitting: Student in an Organized Health Care Education/Training Program

## 2021-04-30 VITALS — BP 126/85 | HR 64 | Temp 98.2°F | Ht 75.0 in | Wt 238.0 lb

## 2021-04-30 DIAGNOSIS — Z1211 Encounter for screening for malignant neoplasm of colon: Secondary | ICD-10-CM

## 2021-04-30 DIAGNOSIS — K219 Gastro-esophageal reflux disease without esophagitis: Secondary | ICD-10-CM

## 2021-04-30 DIAGNOSIS — Z1212 Encounter for screening for malignant neoplasm of rectum: Secondary | ICD-10-CM

## 2021-04-30 DIAGNOSIS — F4310 Post-traumatic stress disorder, unspecified: Secondary | ICD-10-CM

## 2021-04-30 DIAGNOSIS — Z0000 Encounter for general adult medical examination without abnormal findings: Secondary | ICD-10-CM

## 2021-04-30 DIAGNOSIS — F332 Major depressive disorder, recurrent severe without psychotic features: Secondary | ICD-10-CM

## 2021-04-30 LAB — LIPID PANEL
Cholesterol / HDL Ratio: 4
Cholesterol: 181 mg/dL (ref 0–199)
HDL: 45 mg/dL (ref 40–9999)
LDL Calculated: 112 mg/dL — ABNORMAL HIGH (ref 0–99)
Triglycerides: 119 mg/dL (ref 34–149)
VLDL Calculated: 24 mg/dL (ref 10–40)

## 2021-04-30 LAB — COMPREHENSIVE METABOLIC PANEL
ALT: 25 U/L (ref 0–55)
AST (SGOT): 29 U/L (ref 5–34)
Albumin/Globulin Ratio: 1.2 (ref 0.9–2.2)
Albumin: 4 g/dL (ref 3.5–5.0)
Alkaline Phosphatase: 51 U/L (ref 37–117)
Anion Gap: 9 (ref 5.0–15.0)
BUN: 16 mg/dL (ref 9.0–28.0)
Bilirubin, Total: 0.4 mg/dL (ref 0.2–1.2)
CO2: 27 mEq/L (ref 21–29)
Calcium: 9.2 mg/dL (ref 8.5–10.5)
Chloride: 99 mEq/L — ABNORMAL LOW (ref 100–111)
Creatinine: 1.1 mg/dL (ref 0.5–1.5)
Globulin: 3.4 g/dL (ref 2.0–3.7)
Glucose: 89 mg/dL (ref 70–100)
Potassium: 4.5 mEq/L (ref 3.5–5.1)
Protein, Total: 7.4 g/dL (ref 6.0–8.3)
Sodium: 135 mEq/L — ABNORMAL LOW (ref 136–145)

## 2021-04-30 LAB — CBC
Absolute NRBC: 0 10*3/uL (ref 0.00–0.00)
Hematocrit: 45.2 % (ref 37.6–49.6)
Hgb: 14.4 g/dL (ref 12.5–17.1)
MCH: 31.6 pg (ref 25.1–33.5)
MCHC: 31.9 g/dL (ref 31.5–35.8)
MCV: 99.1 fL — ABNORMAL HIGH (ref 78.0–96.0)
MPV: 10.8 fL (ref 8.9–12.5)
Nucleated RBC: 0 /100 WBC (ref 0.0–0.0)
Platelets: 275 10*3/uL (ref 142–346)
RBC: 4.56 10*6/uL (ref 4.20–5.90)
RDW: 12 % (ref 11–15)
WBC: 6.95 10*3/uL (ref 3.10–9.50)

## 2021-04-30 LAB — TSH: TSH: 1.64 u[IU]/mL (ref 0.35–4.94)

## 2021-04-30 LAB — HEMOLYSIS INDEX: Hemolysis Index: 6 (ref 0–24)

## 2021-04-30 LAB — GFR: EGFR: >60

## 2021-04-30 MED ORDER — SERTRALINE HCL 25 MG PO TABS
50.0000 mg | ORAL_TABLET | Freq: Every day | ORAL | 0 refills | Status: AC
Start: 2021-04-30 — End: 2021-07-29
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
## 2021-05-02 ENCOUNTER — Ambulatory Visit (INDEPENDENT_AMBULATORY_CARE_PROVIDER_SITE_OTHER): Payer: 59 | Admitting: Family Medicine

## 2021-05-07 NOTE — Progress Notes (Signed)
Subjective:       Patient ID: Bill Kaiser is a 55 y.o. male.    HPI    Chief Complaint   Patient presents with   . Annual Exam   . Dental Pain     Wisdom      See resident note    Review of Systems  See resident note      Objective:    Physical Exam  BP 126/85   Pulse 64   Temp 98.2 F (36.8 C)   Ht 1.905 m (6\' 3" )   Wt 108 kg (238 lb)   SpO2 98%   BMI 29.75 kg/m   See resident note      Assessment:       1. PTSD (post-traumatic stress disorder)     2. Screening for colorectal cancer     3. Routine physical examination  CBC without differential    Comprehensive metabolic panel    Lipid panel    TSH   4. Gastroesophageal reflux disease without esophagitis     5. Severe episode of recurrent major depressive disorder, without psychotic features  sertraline (Zoloft) 25 MG tablet           Plan:      Procedures  Orders Placed This Encounter   Procedures   . CBC without differential     Order Specific Question:   Has the patient fasted?     Answer:   Yes     Order Specific Question:   Release to patient     Answer:   Immediate   . Comprehensive metabolic panel     Order Specific Question:   Has the patient fasted?     Answer:   Yes     Order Specific Question:   Release to patient     Answer:   Immediate   . Lipid panel     Order Specific Question:   Has the patient fasted?     Answer:   Yes     Order Specific Question:   Release to patient     Answer:   Immediate   . TSH     Order Specific Question:   Release to patient     Answer:   Immediate   . GFR     Has the patient fasted?->Yes   . Hemolysis index     Has the patient fasted?->Yes     Medications Ordered This Encounter       Disp Refills Start End    sertraline (Zoloft) 25 MG tablet 90 tablet 0 04/30/2021 07/29/2021    Take 2 tablets (50 mg total) by mouth daily - Oral        Attending Physician Attestation:     I was present during or personally performed key portions of the service documented above by Dr. Manson Passey, the resident physician. I discussed with  the resident, and was directly involved in the management of the patient. I agree with the note as written.    Plan:  Agree with resident note.

## 2021-06-06 ENCOUNTER — Telehealth (INDEPENDENT_AMBULATORY_CARE_PROVIDER_SITE_OTHER): Payer: Self-pay | Admitting: Family Medicine

## 2021-06-06 DIAGNOSIS — F332 Major depressive disorder, recurrent severe without psychotic features: Secondary | ICD-10-CM

## 2021-06-06 DIAGNOSIS — F4310 Post-traumatic stress disorder, unspecified: Secondary | ICD-10-CM

## 2021-06-06 MED ORDER — ALPRAZOLAM 0.5 MG PO TABS
0.5000 mg | ORAL_TABLET | Freq: Two times a day (BID) | ORAL | 0 refills | Status: DC | PRN
Start: 2021-06-06 — End: 2021-06-27

## 2021-06-06 NOTE — Telephone Encounter (Signed)
Patient called to request medication refill for ALPRAZolam (Xanax) 0.5 MG tablet to be [facility] to [id number] - [place] [address], [place] - [address]

## 2021-06-06 NOTE — Telephone Encounter (Signed)
Last refill 04/24/21 30 tabs  Last o/v 04/30/21  Appt. 07/04/21

## 2021-06-06 NOTE — Telephone Encounter (Signed)
Refill done (PMP reviewed) thank you.

## 2021-06-27 ENCOUNTER — Telehealth (INDEPENDENT_AMBULATORY_CARE_PROVIDER_SITE_OTHER): Payer: Self-pay | Admitting: Family Medicine

## 2021-06-27 DIAGNOSIS — F4310 Post-traumatic stress disorder, unspecified: Secondary | ICD-10-CM

## 2021-06-27 DIAGNOSIS — F332 Major depressive disorder, recurrent severe without psychotic features: Secondary | ICD-10-CM

## 2021-06-27 MED ORDER — ALPRAZOLAM 0.5 MG PO TABS
0.5000 mg | ORAL_TABLET | Freq: Two times a day (BID) | ORAL | 0 refills | Status: DC | PRN
Start: 2021-06-27 — End: 2021-08-13

## 2021-06-27 NOTE — Telephone Encounter (Signed)
Patient called in to request RX refill. See below. Pt. would like for PCP to give him a call at [phone] (M)  regarding increasing dosage.  ALPRAZolam (Xanax) 0.5 MG tablet    Please send to:  [id number] - [place] [facility], [place] - [address]      Last OV: 04/30/21

## 2021-06-27 NOTE — Telephone Encounter (Signed)
LVM to let pt know rx was sent, how to take, and to be sure to keep appt.

## 2021-06-27 NOTE — Telephone Encounter (Signed)
I have refilled current medication dose of 0.5 mg to be taken twice a day for now.  Please advise to keep appointment appointment to discuss further.  Thank you.

## 2021-06-27 NOTE — Telephone Encounter (Signed)
Please see below.

## 2021-07-04 ENCOUNTER — Ambulatory Visit (INDEPENDENT_AMBULATORY_CARE_PROVIDER_SITE_OTHER): Payer: 59 | Admitting: Family Medicine

## 2021-07-04 DIAGNOSIS — E669 Obesity, unspecified: Secondary | ICD-10-CM

## 2021-07-04 DIAGNOSIS — F332 Major depressive disorder, recurrent severe without psychotic features: Secondary | ICD-10-CM

## 2021-07-04 DIAGNOSIS — Z1211 Encounter for screening for malignant neoplasm of colon: Secondary | ICD-10-CM

## 2021-07-04 DIAGNOSIS — M5412 Radiculopathy, cervical region: Secondary | ICD-10-CM

## 2021-07-04 DIAGNOSIS — F4310 Post-traumatic stress disorder, unspecified: Secondary | ICD-10-CM

## 2021-07-09 ENCOUNTER — Ambulatory Visit: Payer: 59

## 2021-07-09 ENCOUNTER — Ambulatory Visit: Payer: 59 | Admitting: Certified Registered"

## 2021-07-09 ENCOUNTER — Ambulatory Visit
Admission: RE | Admit: 2021-07-09 | Discharge: 2021-07-09 | Disposition: A | Discharge status: Home / Self Care | Payer: 59 | Source: Ambulatory Visit | Attending: Physician Assistant | Admitting: Physician Assistant

## 2021-07-09 DIAGNOSIS — M50323 Other cervical disc degeneration at C6-C7 level: Secondary | ICD-10-CM | POA: Insufficient documentation

## 2021-07-09 DIAGNOSIS — M9971 Connective tissue and disc stenosis of intervertebral foramina of cervical region: Secondary | ICD-10-CM | POA: Insufficient documentation

## 2021-07-09 DIAGNOSIS — M4802 Spinal stenosis, cervical region: Secondary | ICD-10-CM | POA: Insufficient documentation

## 2021-07-09 DIAGNOSIS — G8929 Other chronic pain: Secondary | ICD-10-CM | POA: Insufficient documentation

## 2021-07-09 DIAGNOSIS — M5412 Radiculopathy, cervical region: Secondary | ICD-10-CM

## 2021-07-09 DIAGNOSIS — M546 Pain in thoracic spine: Secondary | ICD-10-CM

## 2021-07-09 DIAGNOSIS — M50321 Other cervical disc degeneration at C4-C5 level: Secondary | ICD-10-CM | POA: Insufficient documentation

## 2021-07-09 DIAGNOSIS — M50322 Other cervical disc degeneration at C5-C6 level: Secondary | ICD-10-CM | POA: Insufficient documentation

## 2021-07-09 DIAGNOSIS — M47814 Spondylosis without myelopathy or radiculopathy, thoracic region: Secondary | ICD-10-CM | POA: Insufficient documentation

## 2021-07-09 DIAGNOSIS — M5031 Other cervical disc degeneration,  high cervical region: Secondary | ICD-10-CM | POA: Insufficient documentation

## 2021-07-09 DIAGNOSIS — R0781 Pleurodynia: Secondary | ICD-10-CM | POA: Insufficient documentation

## 2021-07-09 DIAGNOSIS — M47812 Spondylosis without myelopathy or radiculopathy, cervical region: Secondary | ICD-10-CM | POA: Insufficient documentation

## 2021-07-09 MED ORDER — LACTATED RINGERS IV SOLN
INTRAVENOUS | Status: DC | PRN
Start: 2021-07-09 — End: 2021-07-09

## 2021-07-09 MED ORDER — KETAMINE HCL 10 MG/ML IJ/IV SOLN (WRAP)
Status: DC | PRN
Start: 2021-07-09 — End: 2021-07-09
  Administered 2021-07-09: 6 ug/kg/min via INTRAVENOUS

## 2021-07-09 MED ORDER — KETAMINE HCL 50 MG/ML IJ SOLN
INTRAMUSCULAR | Status: AC
Start: 2021-07-09 — End: ?
  Filled 2021-07-09: qty 10

## 2021-07-09 MED ORDER — EPHEDRINE SULFATE 50 MG/ML IJ/IV SOLN (WRAP)
Status: AC
Start: 2021-07-09 — End: ?
  Filled 2021-07-09: qty 1

## 2021-07-09 MED ORDER — PROPOFOL 10 MG/ML IV EMUL (WRAP)
INTRAVENOUS | Status: AC
Start: 2021-07-09 — End: ?
  Filled 2021-07-09: qty 20

## 2021-07-09 MED ORDER — FENTANYL CITRATE (PF) 50 MCG/ML IJ SOLN (WRAP)
INTRAMUSCULAR | Status: AC
Start: 2021-07-09 — End: ?
  Filled 2021-07-09: qty 5

## 2021-07-09 MED ORDER — PHENYLEPHRINE 100 MCG/ML IV SOSY (WRAP)
PREFILLED_SYRINGE | INTRAVENOUS | Status: AC
Start: 2021-07-09 — End: ?
  Filled 2021-07-09: qty 10

## 2021-07-09 MED ORDER — MIDAZOLAM HCL 1 MG/ML IJ SOLN (WRAP)
INTRAMUSCULAR | Status: DC | PRN
Start: 2021-07-09 — End: 2021-07-09
  Administered 2021-07-09: 3 mg via INTRAVENOUS

## 2021-07-09 MED ORDER — PROPOFOL 10 MG/ML IV EMUL (WRAP)
INTRAVENOUS | Status: DC | PRN
Start: 2021-07-09 — End: 2021-07-09
  Administered 2021-07-09: 50 mg via INTRAVENOUS
  Administered 2021-07-09: 20 mg via INTRAVENOUS

## 2021-07-09 MED ORDER — LIDOCAINE HCL (PF) 2 % IJ SOLN
INTRAMUSCULAR | Status: AC
Start: 2021-07-09 — End: ?
  Filled 2021-07-09: qty 5

## 2021-07-09 MED ORDER — MIDAZOLAM HCL 1 MG/ML IJ SOLN (WRAP)
INTRAMUSCULAR | Status: AC
Start: 2021-07-09 — End: ?
  Filled 2021-07-09: qty 5

## 2021-07-09 MED ORDER — FAMOTIDINE 10 MG/ML IV SOLN (WRAP)
INTRAVENOUS | Status: DC | PRN
Start: 2021-07-09 — End: 2021-07-09
  Administered 2021-07-09: 20 mg via INTRAVENOUS

## 2021-07-09 MED ORDER — PROPOFOL 10 MG/ML IV EMUL (WRAP)
INTRAVENOUS | Status: AC
Start: 2021-07-09 — End: ?
  Filled 2021-07-09: qty 50

## 2021-07-09 MED ORDER — LIDOCAINE HCL 2 % IJ SOLN
INTRAMUSCULAR | Status: DC | PRN
Start: 2021-07-09 — End: 2021-07-09
  Administered 2021-07-09: 40 mg via INTRAVENOUS

## 2021-07-09 MED ORDER — PROPOFOL INFUSION 10 MG/ML
INTRAVENOUS | Status: DC | PRN
Start: 2021-07-09 — End: 2021-07-09
  Administered 2021-07-09: 60 ug/kg/min via INTRAVENOUS

## 2021-07-09 NOTE — Anesthesia Postprocedure Evaluation (Signed)
Anesthesia Post Evaluation    Patient: CORBIN FALCK    * No procedures listed *    Anesthesia type: general    Last Vitals:   Vitals Value Taken Time   BP 118/84 07/09/21 1410   Temp 37.3 C (99.2 F) 07/09/21 1410   Pulse 58 07/09/21 1410   Resp 18 07/09/21 1359   SpO2 99 % 07/09/21 1410                 Anesthesia Post Evaluation:     Patient Evaluated: PACU    Level of Consciousness: awake and alert  Pain Score: 0      Airway Patency: patent    Anesthetic complications: No      PONV Status: none    Cardiovascular status: hemodynamically stable  Respiratory status: room air  Hydration status: stable        Signed by: Humberto Seals, MD, 07/09/2021 3:45 PM

## 2021-07-09 NOTE — Anesthesia Preprocedure Evaluation (Signed)
Anesthesia Evaluation    AIRWAY    Mallampati: II    TM distance: >3 FB  Neck ROM: full  Mouth Opening:full  Planned to use difficult airway equipment: No CARDIOVASCULAR    cardiovascular exam normal, regular and normal       DENTAL    no notable dental hx     PULMONARY    pulmonary exam normal and clear to auscultation     OTHER FINDINGS                  Relevant Problems   GI   (+) Gastroesophageal reflux disease without esophagitis      OTHER   (+) H/O asplenia               Anesthesia Plan    ASA 2     general                     intravenous induction   Detailed anesthesia plan: general IV        Post op pain management: per surgeon    informed consent obtained    Plan discussed with CRNA.    ECG reviewed (SR)               Signed by: Humberto Seals, MD 07/09/21 11:59 AM

## 2021-07-09 NOTE — Transfer of Care (Signed)
Anesthesia Transfer of Care Note    Patient: Bill Kaiser    Procedures performed: *MRI *    Anesthesia type: General TIVA    Patient location:Phase I PACU    Last vitals:   Vitals:    07/09/21 1359   BP: 116/79   Pulse: 65   Resp: 18   Temp: 37.6 C (99.7 F)   SpO2: 97%       Post pain: Patient not complaining of pain, continue current therapy      Mental Status:awake and alert     Respiratory Function: tolerating room air    Cardiovascular: stable    Nausea/Vomiting: patient not complaining of nausea or vomiting    Hydration Status: adequate    Post assessment: no apparent anesthetic complications, no reportable events and no evidence of recall    Signed by: Michaele Offer, CRNA  07/09/21 1:59 PM

## 2021-07-09 NOTE — Discharge Instr - AVS First Page (Signed)
No Driving today  Resume normal diet  Resume normal meds  Follow up with ordering physician

## 2021-07-11 ENCOUNTER — Other Ambulatory Visit (INDEPENDENT_AMBULATORY_CARE_PROVIDER_SITE_OTHER): Payer: Self-pay | Admitting: Physician Assistant

## 2021-07-11 ENCOUNTER — Ambulatory Visit: Payer: 59 | Attending: Physician Assistant

## 2021-07-11 DIAGNOSIS — M5412 Radiculopathy, cervical region: Secondary | ICD-10-CM
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
## 2021-07-18 ENCOUNTER — Ambulatory Visit (INDEPENDENT_AMBULATORY_CARE_PROVIDER_SITE_OTHER): Payer: 59 | Admitting: Neurological Surgery

## 2021-07-18 ENCOUNTER — Encounter (INDEPENDENT_AMBULATORY_CARE_PROVIDER_SITE_OTHER): Payer: Self-pay | Admitting: Neurological Surgery

## 2021-07-18 ENCOUNTER — Encounter (INDEPENDENT_AMBULATORY_CARE_PROVIDER_SITE_OTHER): Payer: Self-pay

## 2021-07-18 VITALS — BP 135/86 | HR 63 | Temp 98.3°F | Ht 75.0 in | Wt 230.8 lb

## 2021-07-18 DIAGNOSIS — M5412 Radiculopathy, cervical region: Secondary | ICD-10-CM

## 2021-07-20 ENCOUNTER — Ambulatory Visit: Payer: 59

## 2021-07-20 NOTE — Progress Notes (Signed)
07/20/21 1000   Pain History   Pain Symptoms Pain   Pain Location Neck;Hand/Finger  Left   Pain Description Sharp/Stabbing  (Compressed nerves)   Pain Frequency Constant   ISP Appointment   ISP Physician Karmen Bongo   ISP Appointment Location Pain Management

## 2021-07-30 ENCOUNTER — Ambulatory Visit: Payer: 59 | Admitting: Physician Assistant

## 2021-08-13 ENCOUNTER — Telehealth (INDEPENDENT_AMBULATORY_CARE_PROVIDER_SITE_OTHER): Payer: Self-pay | Admitting: Family Medicine

## 2021-08-13 DIAGNOSIS — F4310 Post-traumatic stress disorder, unspecified: Secondary | ICD-10-CM

## 2021-08-13 DIAGNOSIS — F332 Major depressive disorder, recurrent severe without psychotic features: Secondary | ICD-10-CM

## 2021-08-13 MED ORDER — ALPRAZOLAM 0.5 MG PO TABS
0.5000 mg | ORAL_TABLET | Freq: Two times a day (BID) | ORAL | 0 refills | Status: DC | PRN
Start: 2021-08-13 — End: 2021-09-06

## 2021-08-13 NOTE — Telephone Encounter (Signed)
Pt called - requesting refill for Xanax. Last ov 04/30/21 and fu apt scheduled on 09/11/21

## 2021-08-13 NOTE — Telephone Encounter (Signed)
Medication refill done, PMP reviewed.  Please advise patient to keep upcoming appointment.

## 2021-08-13 NOTE — Telephone Encounter (Signed)
Xanax refill request.  

## 2021-08-15 ENCOUNTER — Ambulatory Visit
Admission: RE | Admit: 2021-08-15 | Discharge: 2021-08-15 | Disposition: A | Discharge status: Home / Self Care | Payer: 59 | Source: Ambulatory Visit | Attending: Anesthesiology | Admitting: Anesthesiology

## 2021-08-15 ENCOUNTER — Encounter: Payer: Self-pay | Admitting: Physician Assistant

## 2021-08-15 VITALS — BP 115/81 | HR 67 | Temp 98.1°F | Ht 76.0 in | Wt 229.0 lb

## 2021-08-15 DIAGNOSIS — M5412 Radiculopathy, cervical region: Secondary | ICD-10-CM | POA: Insufficient documentation

## 2021-08-15 DIAGNOSIS — M5022 Other cervical disc displacement, mid-cervical region, unspecified level: Secondary | ICD-10-CM

## 2021-08-15 MED ORDER — CYCLOBENZAPRINE HCL 10 MG PO TABS
10.0000 mg | ORAL_TABLET | Freq: Three times a day (TID) | ORAL | 1 refills | Status: AC | PRN
Start: 2021-08-15 — End: 2021-10-14

## 2021-08-15 NOTE — Patient Instructions (Signed)
Trial of flexeril as needed.  Continue home exercise program and stretching.  Follow up for cervical epidural after insurance authorization is obtained.  The office will contact patient to schedule once approval is received.
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
## 2021-08-16 NOTE — Addendum Note (Signed)
Encounter addended by: Richard Holz D, MD on: 08/16/2021 11:33 AM   Actions taken: Clinical Note Signed

## 2021-08-17 ENCOUNTER — Ambulatory Visit (INDEPENDENT_AMBULATORY_CARE_PROVIDER_SITE_OTHER): Payer: 59 | Admitting: Neurological Surgery

## 2021-09-06 ENCOUNTER — Telehealth (INDEPENDENT_AMBULATORY_CARE_PROVIDER_SITE_OTHER): Payer: Self-pay | Admitting: Family Medicine

## 2021-09-06 DIAGNOSIS — F4310 Post-traumatic stress disorder, unspecified: Secondary | ICD-10-CM

## 2021-09-06 DIAGNOSIS — F332 Major depressive disorder, recurrent severe without psychotic features: Secondary | ICD-10-CM

## 2021-09-06 MED ORDER — ALPRAZOLAM 0.5 MG PO TABS
0.5000 mg | ORAL_TABLET | Freq: Two times a day (BID) | ORAL | 0 refills | Status: DC | PRN
Start: 2021-09-06 — End: 2021-11-05

## 2021-09-06 NOTE — Telephone Encounter (Signed)
Pt called - requesting refill for Xanax. Last ov 04/30/21 and fu apt on 09/11/21

## 2021-09-06 NOTE — Telephone Encounter (Signed)
Medication refill done, PMP reviewed.

## 2021-09-06 NOTE — Telephone Encounter (Signed)
Last refill 08/13/21  Last o/v 04/30/21  Appt. 09/11/21

## 2021-09-10 ENCOUNTER — Ambulatory Visit (INDEPENDENT_AMBULATORY_CARE_PROVIDER_SITE_OTHER): Payer: 59 | Admitting: Internal Medicine

## 2021-09-11 ENCOUNTER — Ambulatory Visit (INDEPENDENT_AMBULATORY_CARE_PROVIDER_SITE_OTHER): Payer: 59 | Admitting: Family Medicine

## 2021-09-11 ENCOUNTER — Ambulatory Visit: Payer: Self-pay | Admitting: Anesthesiology

## 2021-09-14 ENCOUNTER — Ambulatory Visit (INDEPENDENT_AMBULATORY_CARE_PROVIDER_SITE_OTHER): Payer: 59 | Admitting: Neurological Surgery

## 2021-10-18 ENCOUNTER — Ambulatory Visit: Payer: 59 | Admitting: Anesthesiology

## 2021-10-23 ENCOUNTER — Ambulatory Visit (INDEPENDENT_AMBULATORY_CARE_PROVIDER_SITE_OTHER): Payer: 59 | Admitting: Family Medicine

## 2021-10-24 ENCOUNTER — Ambulatory Visit (INDEPENDENT_AMBULATORY_CARE_PROVIDER_SITE_OTHER): Payer: 59 | Admitting: Gastroenterology

## 2021-10-24 ENCOUNTER — Ambulatory Visit (INDEPENDENT_AMBULATORY_CARE_PROVIDER_SITE_OTHER): Payer: 59 | Admitting: Neurological Surgery

## 2021-10-24 ENCOUNTER — Encounter (INDEPENDENT_AMBULATORY_CARE_PROVIDER_SITE_OTHER): Payer: Self-pay | Admitting: Gastroenterology

## 2021-10-24 VITALS — BP 124/85 | HR 72 | Resp 18 | Ht 72.0 in | Wt 225.5 lb

## 2021-10-24 DIAGNOSIS — K219 Gastro-esophageal reflux disease without esophagitis: Secondary | ICD-10-CM

## 2021-10-24 DIAGNOSIS — R1032 Left lower quadrant pain: Secondary | ICD-10-CM

## 2021-10-24 DIAGNOSIS — K921 Melena: Secondary | ICD-10-CM

## 2021-10-24 DIAGNOSIS — Z87891 Personal history of nicotine dependence: Secondary | ICD-10-CM

## 2021-10-24 DIAGNOSIS — K5900 Constipation, unspecified: Secondary | ICD-10-CM

## 2021-10-24 MED ORDER — FAMOTIDINE 40 MG PO TABS
20.0000 mg | ORAL_TABLET | Freq: Two times a day (BID) | ORAL | 5 refills | Status: AC
Start: 2021-10-24 — End: 2022-10-24

## 2021-10-24 MED ORDER — GOLYTELY 236 G PO SOLR
4.0000 L | Freq: Once | ORAL | 0 refills | Status: AC
Start: 2021-10-24 — End: 2021-10-24

## 2021-10-24 NOTE — Patient Instructions (Addendum)
Please schedule endoscopy and colonoscopy.    Start metamucil daily        Split Dose Colonoscopy Preparation Instructions - COLYTE, GAVILYTE, GOLYTELY, OR NULYTELY  Please carefully read a week before your procedure.    IF YOU ARE ON BLOOD THINNERS (COUMADIN, PLAVIX, etc.), INSULIN OR OTHER DIABETIC MEDICATIONS, PLEASE LET us KNOW AND CHECK WITH YOUR PRESCRIBING PHYSICIAN FOR INSTRUCTIONS. Your prescribing provider needs to determine if you should stop or stay on your blood thinner before procedure.  Not following these instructions may result in cancellation.      General Endoscopy Information  Do no chew gum or suck on hard candy the day of your procedure.  You must have a responsible adult to accompany you home after the procedure. This person must pick you up in the endoscopy unit. If you do not have a responsible adult to accompany you home, your procedure will be cancelled.   You may not operate a motor vehicle for the remainder of the day following your procedure.   You may not take a taxi, [place] Goad or bus home unless accompanied by a responsible adult.   If your insurance company requires a REFERRAL, YOU MUST BRING IT WITH YOU. Also, please bring your current insurance card(s), copay (if applicable), and a current picture ID with you on the day of your procedure.   Our office will contact your insurance carrier to verify coverage and, if required, obtain preauthorization for your procedure. However, preauthorization is not a guarantee of payment and you will be responsible for any deductibles, copays, co-insurance, and/or any other plan specific out-of-pocket expenses.   Dependent upon your family history, personal history, prior gastroenterology diagnoses, or findings discovered during your colonoscopy, your procedure may be considered preventative or diagnostic. This determination will not be made until after the procedure has concluded and will be based upon the findings of your exam. In our experience, many  insurance carriers cover preventative and diagnostic colonoscopies differently, and as a result, your out-of-pocket payment may also differ. If you have any questions about your coverage, please contact your insurance carrier directly.   If you have any questions, please contact your GI physicians office during normal business hours.    Preparation Instructions    One (1) day before the procedure date: Split Dose: 1st half    Drink only clear liquids the entire day. No solid food should be taken. Clear liquids include: water, broth without any solid pieces, apple juice, white grape juice, pulp free lemonade, sprite, ginger-ale, coffee or tea without milk or nondairy creamers, plain Jell-O (no added fruit or toppings, no red, purple, or blue Jell-O). See clear diet below.    Prepare the solution: Gavilyte, Golytely, Nulytely or Colyte Prep: mix the powder with water in the provided plastic container to the fill line and chill in the refrigerator.   You may add Crystal Light powdered lemonade (as an alternative to the flavor packets) to the solution to improve its taste.  No solid food should be taken during or after the prep.   At 5 p.m.: Begin 1st dose of the prep solution at a rate of 8 ounces every 15-30 minutes (over 1-2 hours) until half of the prep solution is finished. If you feel full or nauseated by drinking the solution, then slow down and finish the first half of the solution before midnight.  Please continue to drink clear liquids until you go to bed.     * It is not uncommon  for individuals to experience bloating or nausea when drinking the solution. If vomiting or other symptoms concern you, please call your GI physicians office.    The day of your procedure: Split dose: 2nd half    6 hours before your procedure time: Start 2nd dose of prep solution. Depending on your procedure start time this may require waking up early to complete the prep.  Drink the solution at a rate of 8 ounces every 15-30  minutes (over 1-2 hours) until you finish the entire solution.  It is important that you finish the remaining 2 liters of the prep solution at least 4 hours before your scheduled procedure.  Complete 2nd dose 4 hours prior to your scheduled start time. Do not take anything by mouth starting 4 hours before your procedure.  Do not eat hard candy or chew gum.   Wear comfortable clothing that is easy to remove and leave jewelry at home. Leave any valuables (i.e., purse, cell phone etc.) with family or companion.  Report to the Endoscopy Procedure Unit 90 minutes before your scheduled procedure time.  Once in the pre-procedural area, you will be asked to put on a hospital gown. A nurse will review your medical history with you (Bring a list of your current medication, allergies, and a copy of your recent EKG). An intravenous line (IV) will be started for your sedation during the procedure.  When your procedure is done, you may remain in the recovery room for up to 1 hour.  Your doctor will discuss the results of your procedure with you and give you a copy of your report.     CLEAR LIQUID DIET   THESE ITEMS ARE ALLOWED:  Water  Clear broth: beef, chicken, vegetable  Juices  Apple juice or cider  White grape juice, white cranberry juice  Tang  Lemonade  Soda (clear color)  Tea  Coffee (without cream)  Clear gelatin (without fruit)  Popsicles (without fruit or cream)  [place] ices THESE ITEMS ARE NOT ALLOWED:  Milk  Cream  Milkshakes  Tomato juice  Orange juice  Cream soups  Any soup other than the listed broth  Oatmeal  Cream of Wheat  Grapefruit juice  Alcohol     PLEASE NOTE: It is extremely important to follow the preparation listed above, so that the doctor will be able to see your entire colon. Your colon must be clear of any stool. Inadequate preparation limits the value of this procedure and could necessitate rescheduling of the examination.     * If you need to reschedule or cancel your appointment, please call you  GI physicians office.      FREQUENTLY ASKED QUESTIONS:    My procedure is in the afternoon. Can I eat in the morning?   A: No. To ensure your safety during the procedure, it is important that the stomach is empty. Any food or liquid in the stomach at the time of the procedure places you at risk of aspirating these contents into the lung leading to a serious complication called aspiration pneumonia. You can drink water until 4 hours before your procedure time.     I ate breakfast (lunch or dinner) the day before my colonoscopy. Is that okay?    A: If the preparation instructions were not followed properly, residual stool may remain in the colon and hide important findings from the examining physician. In some cases, if the colon preparation is not good, you may have to repeat the preparation and  the exam. If you accidentally eat any solid food the day before your exam, please call and ask to speak with a member of the nursing staff. You may be asked to reschedule your procedure.     I don't have a ride. Is that okay?   A: No. If you do not have a responsible adult to accompany you home, YOUR PROCEDURE WILL BE CANCELLED.    How many days prior to the procedure should I discontinue my Coumadin, Plavix or other blood thinning medications?   A: If you are taking any blood thinning medications please let your endoscopist know. Generally speaking, you should quit taking your blood thinning medication 2-7 days prior to some procedures depending on the medication; however, you must check with your endoscopist and your prescribing physician to ensure that it is needed and safe for you to do so.    What medications can I take the day before and the day of my procedure?  A: The day prior to your procedure take your medications the way you normally would. However, for those patients taking any type of bowel cleansing preparation, be advised that you may undergo a prolonged period of diarrhea that may flush oral medications out  of your system before they have time to take effect. The morning of your procedure you should take any blood pressure or heart medications you may be on with a small sip of water. You can hold most other medications and take them once your procedure has been completed. If you have questions about specific medication(s), please call a member of the endoscopy unit clinical staff.    I am diabetic. Do I take my insulin?   A: You must direct the question to the physician who placed you on this medication. Please check your blood sugar the morning of your procedure as you normally would. If you have any questions about your diabetes management in conjunction with your fast for your endoscopic procedure, please consult your primary physician.     I am on pain medication? Can I take it prior to my procedure?   A: You may take your prescription pain medications prior to your procedure with a small sip of water. Please inform the pre-procedural clinical staff of any medications youve taken the day of your procedure. If you have any questions, please call a member of the endoscopy unit clinical staff.    I'm having a menstrual period. Should I reschedule my colonoscopy appointment?   A: No. Your menstrual period will not interfere with your physician's ability to complete your procedure.     May I continue taking my iron tablets?   A: No. Iron may cause the formation of dark color stools which can make it difficult for the physician to complete your colonoscopy if your preparation is less than optimal. We recommend you stop taking your oral iron supplements at least one week prior to your procedure.     I have been on Aspirin therapy for my heart. Should I continue to take it?   A: Aspirin may affect blood coagulation. Please check with your endoscopist and prescribing physician.     I am having a colonoscopy tomorrow. I started my colon preparation on time but now I am experiencing diarrhea and/or a bloating feeling. What  should I do?   A: Nausea, vomiting and a sense of fullness or bloating can occur any time after beginning your colon preparation. However, it is important that you drink all the preparation.  For most people, taking an hour break from the preparation will usually help. Then continue taking the preparation as ordered. If the vomiting returns or symptoms get worse, please call your GI physicians office.    If you have any questions or concerns, please don't hesitate to call.     Sincerely,    [facility]
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
## 2021-10-30 ENCOUNTER — Telehealth: Payer: Self-pay | Admitting: Gastroenterology

## 2021-10-30 NOTE — Telephone Encounter (Signed)
Pt has been rescheduled to 12/11/21 at 2:00 pm at [facility] with Dr. Roselyn Bering. Pt requested a Mychart message confirmation.

## 2021-10-30 NOTE — Telephone Encounter (Signed)
Pt called to reschedule procedure for 12/04/2021 from the morning to an afternoon time.Pt can be reached at [phone].

## 2021-10-31 ENCOUNTER — Other Ambulatory Visit (INDEPENDENT_AMBULATORY_CARE_PROVIDER_SITE_OTHER): Payer: Self-pay

## 2021-10-31 ENCOUNTER — Encounter (INDEPENDENT_AMBULATORY_CARE_PROVIDER_SITE_OTHER): Payer: Self-pay

## 2021-10-31 DIAGNOSIS — Z1211 Encounter for screening for malignant neoplasm of colon: Secondary | ICD-10-CM

## 2021-10-31 DIAGNOSIS — K219 Gastro-esophageal reflux disease without esophagitis: Secondary | ICD-10-CM

## 2021-10-31 DIAGNOSIS — Z87891 Personal history of nicotine dependence: Secondary | ICD-10-CM

## 2021-10-31 NOTE — Addendum Note (Signed)
Addended by: Stephannie Peters on: 10/31/2021 10:31 AM     Modules accepted: Orders

## 2021-11-02 ENCOUNTER — Telehealth (INDEPENDENT_AMBULATORY_CARE_PROVIDER_SITE_OTHER): Payer: Self-pay | Admitting: Family Medicine

## 2021-11-02 NOTE — Telephone Encounter (Signed)
err

## 2021-11-05 ENCOUNTER — Encounter (INDEPENDENT_AMBULATORY_CARE_PROVIDER_SITE_OTHER): Payer: Self-pay | Admitting: Family Medicine

## 2021-11-05 ENCOUNTER — Encounter: Payer: Self-pay | Admitting: Anesthesiology

## 2021-11-05 ENCOUNTER — Ambulatory Visit (INDEPENDENT_AMBULATORY_CARE_PROVIDER_SITE_OTHER): Payer: 59 | Admitting: Family Medicine

## 2021-11-05 VITALS — BP 111/78 | HR 118 | Temp 97.9°F | Wt 230.0 lb

## 2021-11-05 DIAGNOSIS — R221 Localized swelling, mass and lump, neck: Secondary | ICD-10-CM

## 2021-11-05 DIAGNOSIS — M5412 Radiculopathy, cervical region: Secondary | ICD-10-CM

## 2021-11-05 DIAGNOSIS — K219 Gastro-esophageal reflux disease without esophagitis: Secondary | ICD-10-CM

## 2021-11-05 DIAGNOSIS — R222 Localized swelling, mass and lump, trunk: Secondary | ICD-10-CM

## 2021-11-05 DIAGNOSIS — Z125 Encounter for screening for malignant neoplasm of prostate: Secondary | ICD-10-CM | POA: Insufficient documentation

## 2021-11-05 DIAGNOSIS — Z23 Encounter for immunization: Secondary | ICD-10-CM

## 2021-11-05 DIAGNOSIS — R41840 Attention and concentration deficit: Secondary | ICD-10-CM

## 2021-11-05 DIAGNOSIS — E669 Obesity, unspecified: Secondary | ICD-10-CM

## 2021-11-05 DIAGNOSIS — F4310 Post-traumatic stress disorder, unspecified: Secondary | ICD-10-CM

## 2021-11-05 DIAGNOSIS — F332 Major depressive disorder, recurrent severe without psychotic features: Secondary | ICD-10-CM

## 2021-11-05 DIAGNOSIS — M47816 Spondylosis without myelopathy or radiculopathy, lumbar region: Secondary | ICD-10-CM

## 2021-11-05 DIAGNOSIS — Z1212 Encounter for screening for malignant neoplasm of rectum: Secondary | ICD-10-CM

## 2021-11-05 DIAGNOSIS — Z1211 Encounter for screening for malignant neoplasm of colon: Secondary | ICD-10-CM

## 2021-11-05 LAB — COMPREHENSIVE METABOLIC PANEL
ALT: 28 U/L (ref 0–55)
AST (SGOT): 24 U/L (ref 5–41)
Albumin/Globulin Ratio: 1.3 (ref 0.9–2.2)
Albumin: 4.2 g/dL (ref 3.5–5.0)
Alkaline Phosphatase: 55 U/L (ref 37–117)
Anion Gap: 5 (ref 5.0–15.0)
BUN: 18 mg/dL (ref 9.0–28.0)
Bilirubin, Total: 0.5 mg/dL (ref 0.2–1.2)
CO2: 29 mEq/L (ref 17–29)
Calcium: 9.9 mg/dL (ref 8.5–10.5)
Chloride: 102 mEq/L (ref 99–111)
Creatinine: 1 mg/dL (ref 0.5–1.5)
Globulin: 3.2 g/dL (ref 2.0–3.6)
Glucose: 81 mg/dL (ref 70–100)
Potassium: 5.4 mEq/L — ABNORMAL HIGH (ref 3.5–5.3)
Protein, Total: 7.4 g/dL (ref 6.0–8.3)
Sodium: 136 mEq/L (ref 135–145)

## 2021-11-05 LAB — GFR: EGFR: >60

## 2021-11-05 LAB — PSA: Prostate Specific Antigen, Total: 0.403 ng/mL (ref 0.000–4.000)

## 2021-11-05 LAB — HEMOLYSIS INDEX: Hemolysis Index: 9 Index (ref 0–24)

## 2021-11-05 MED ORDER — DULOXETINE HCL 30 MG PO CPEP
30.0000 mg | ORAL_CAPSULE | Freq: Every day | ORAL | 0 refills | Status: AC
Start: 2021-11-05 — End: 2022-02-03

## 2021-11-05 MED ORDER — ALPRAZOLAM 0.5 MG PO TABS
0.5000 mg | ORAL_TABLET | Freq: Two times a day (BID) | ORAL | 0 refills | Status: DC | PRN
Start: 2021-11-05 — End: 2022-01-29

## 2021-11-05 NOTE — Assessment & Plan Note (Signed)
We will follow-up on records.

## 2021-11-05 NOTE — Assessment & Plan Note (Signed)
Scheduled for EGD and colonoscopy December 14, we will follow-up on records.

## 2021-11-05 NOTE — Assessment & Plan Note (Signed)
Medication refill done, PMP reviewed.  Medication side effects and safety profile discussed.

## 2021-11-05 NOTE — Assessment & Plan Note (Signed)
Advised lifestyle changes including diet and exercise as tolerated.

## 2021-11-05 NOTE — Assessment & Plan Note (Signed)
Consult notes reviewed.  Given concerns for possible cervical radiculopathy/myelopathy as well as underlying mood disorder will give trial of Cymbalta to see if that helps with nerve pain as well as underlying mood disorder.

## 2021-11-05 NOTE — Progress Notes (Signed)
[facility]                       Date of Exam: 11/05/2021 5:58 PM        Patient ID: Bill Kaiser is a 55 y.o. male.  Attending Physician: Gillermo Murdoch, MD        Chief Complaint:    Chief Complaint   Patient presents with    OTHER     Medication consult         Assessment and Plan:    Problem List Items Addressed This Visit          Digestive    Gastroesophageal reflux disease without esophagitis (Chronic)    Overview     EGD 04/2018 EGD 04/2018, Dr. Romie Minus  Taking Pepcid         Current Assessment & Plan     Scheduled for EGD and colonoscopy December 14, we will follow-up on records.            Nervous and Auditory    Cervical radiculopathy (Chronic)    Overview     CT cervical spine 04/2020  Xray cervical spine 07/11/2021  Multilevel degenerative disc changes most significant at C4-5, C5-6 and C6-7. Mild anterior grade 1 spondylolisthesis of C4 and C5 which increases from 2 to 3 mm with flexion. No acute fracture  MRI Cervical spine 07/11/2021 Moderate to marked multilevel degenerative disc disease and spondylosis resulting in multilevel foraminal narrowing from C3 -C4 through C6-C7 with superimposed central canal narrowing at C5-C6. No cord compression or cord signal change  Neurosurgery Dr. Irving Burton  [facility] ([facility])         Current Assessment & Plan     Consult notes reviewed.  Given concerns for possible cervical radiculopathy/myelopathy as well as underlying mood disorder will give trial of Cymbalta to see if that helps with nerve pain as well as underlying mood disorder.         Relevant Medications    DULoxetine (CYMBALTA) 30 MG capsule    ALPRAZolam (Xanax) 0.5 MG tablet    Other Relevant Orders    Comprehensive metabolic panel    GFR    Hemolysis index       Other    Recurrent major depressive disorder - Primary (Chronic)    Overview     Has tried Prozac in the past  Took Zoloft for a month with side effects  On Xanax twice daily as needed           Current Assessment & Plan     PHQ score 14 with no reported SI/HI.  Given concerns for cervical radiculopathy will give trial of Cymbalta to see if that helps and continue with Xanax to be used as needed for anxiety/panic attacks.  Medication side effects and safety profile discussed.  Detailed discussion done about red flag signs including SI/HI counseling and supportive care provided.  Will reevaluate in 4 to 6 weeks.         Relevant Medications    DULoxetine (CYMBALTA) 30 MG capsule    ALPRAZolam (Xanax) 0.5 MG tablet    PTSD (post-traumatic stress disorder) (Chronic)    Overview     Xanax twice daily as needed         Current Assessment & Plan     Medication refill done, PMP reviewed.  Medication side effects and safety profile discussed.  Relevant Medications    DULoxetine (CYMBALTA) 30 MG capsule    ALPRAZolam (Xanax) 0.5 MG tablet    Mass of subcutaneous tissue of back    Overview     Status post resection by dermatologist?  Sebaceous cyst         Current Assessment & Plan     We will follow-up on records.         Screening for colorectal cancer    Overview     Colonoscopy 04/2018 known to have polyps         Current Assessment & Plan     Patient scheduled for colonoscopy with Dr. Roselyn Bering December 14, we will follow-up on records.         Obesity (BMI 30.0-34.9)    Current Assessment & Plan     Advised lifestyle changes including diet and exercise as tolerated.         Attention and concentration deficit    Current Assessment & Plan     Referral provided for psychologist to consider ADHD evaluation.         Relevant Orders    Ambulatory referral to Psychiatry    Need for vaccination    Current Assessment & Plan     In office influenza vaccine administered, vaccine side effects and return precautions advised.  Patient counseled to consider new bivalent COVID booster, patient will think about it.  He is not considering shingles vaccine at this time.          Relevant Orders    Flu vaccine QUADRIVALENT  (PF) 6 months and older (FLULAVAL/FLUARIX)    Encounter for prostate cancer screening    Current Assessment & Plan     PSA level ordered.         Relevant Orders    PSA              Problem List:    Patient Active Problem List   Diagnosis    Recurrent major depressive disorder    PTSD (post-traumatic stress disorder)    Gastroesophageal reflux disease without esophagitis    Osteoarthritis of lumbar spine    Mass of subcutaneous tissue of back    Pigmented skin lesions    Cervical radiculopathy    Former smoker    Screening for colorectal cancer    H/O asplenia    Obesity (BMI 30.0-34.9)    Attention and concentration deficit    Need for vaccination    Encounter for prostate cancer screening             Current Meds:    Outpatient Medications Marked as Taking for the 11/05/21 encounter (Office Visit) with Gillermo Murdoch, MD   Medication Sig Dispense Refill    Ascorbic Acid (VITAMIN C) 1000 MG tablet Take 1 tablet (1,000 mg) by mouth daily 2-3 tablets      b complex vitamins capsule Take 1 capsule by mouth every other day      Cholecalciferol (VITAMIN D3) 3000 units Tab Take by mouth          famotidine (PEPCID) 40 MG tablet Take 0.5 tablets (20 mg total) by mouth 2 (two) times daily 60 tablet 5    vitamin A 16109 UNIT capsule Take 1 capsule (10,000 Units) by mouth      VITAMIN K PO Take by mouth          [DISCONTINUED] ALPRAZolam (Xanax) 0.5 MG tablet Take 1 tablet (0.5 mg total) by mouth 2 (two) times  daily as needed for Anxiety 30 tablet 0          Allergies:    Allergies   Allergen Reactions    Penicillins              Past Surgical History:    Past Surgical History:   Procedure Laterality Date    EGD, BIOPSY N/A 08/11/2018    Procedure: EGD, BIOPSY;  Surgeon: Virgina Norfolk, MD;  Location: [facility];  Service: Gastroenterology;  Laterality: N/A;    EGD, COLONOSCOPY N/A 05/13/2018    Procedure: EGD, COLONOSCOPY WITH BIOPSY;  Surgeon: Virgina Norfolk, MD;  Location: [facility];  Service: Gastroenterology;  Laterality:  N/A;    neck injection      cortisone    SPLENECTOMY, TOTAL             Family History:    Family History   Problem Relation Age of Onset    Dementia Mother     No known problems Father     No known problems Brother     Malignant hyperthermia Neg Hx     Pseudochol deficiency Neg Hx            Social History:    Social History     Tobacco Use    Smoking status: Former     Types: Cigarettes, Cigars     Quit date: 03/2018     Years since quitting: 3.6    Smokeless tobacco: Never    Tobacco comments:     vapes   Vaping Use    Vaping Use: Former   Substance Use Topics    Alcohol use: Not Currently    Drug use: No           The following sections were reviewed this encounter by the provider:   Tobacco  Allergies  Meds  Problems  Med Hx  Surg Hx  Fam Hx             HPI:    Patient is here for follow-up.  He was last seen in our office in May by Dr. Thayer Ohm with resident physician Dr. Manson Passey for annual physical exam.  At that time he was prescribed Zoloft to try for underlying mood disorder in addition to concerns that he has been taking.  Patient reports he took the medication for about a month but it because he was experiencing side effects.  Patient reports it is hard for him to explain but he had a sensation of jumping out of the skin.  Denies any SI/HI.  He is requesting refill for Xanax that he uses up to twice a day as needed for anxiety and panic attacks.  Patient reports he recently started a new job and is very excited about it.  He has noticed that he has issues with focusing.  Patient reports as a child he was always hyperactive and his son's been diagnosed with ADHD makes him wonder if he has ADHD and if he needs medication.  He does report that he has been under a lot of stress recently as his mother unfortunately passed away about a month ago.  Patient reports she had a stroke last year in summer and had another one a month ago and unfortunately had a lot of complications with that.  He also reports  continuing pain worsening in the neck and lower back area.  Has seen neurosurgeon as well as pain management, consult notes reviewed.  He also saw GI  for acid reflux, constipation, and lower abdominal pain and blood in stool.  Consult notes reviewed from October 26.  Patient reports since then he has made changes to his diet including more fiber and emergency room seeking help with constipation and has not had any blood in stool.  He is scheduled for EGD as well as colonoscopy on December 14.  Would also like to know if he needs to be tested for any other type of cancer especially with blood tests.  He did follow-up with dermatologist for skin check in June, had the lesion on his back removed and was told that it was noncancerous.  Patient reports the lesion has healed well but at times has some discomfort but has not had any pain, redness or discharge from the area.                      Vital Signs:    BP 111/78 (BP Site: Left arm, Patient Position: Sitting, Cuff Size: Large)   Pulse (!) 118   Temp 97.9 F (36.6 C)   Wt 104.3 kg (230 lb)   SpO2 96%   BMI 31.19 kg/m          ROS:    Review of Systems   Constitutional:  Positive for fatigue. Negative for appetite change, chills, diaphoresis and fever.   HENT:  Negative for congestion, ear pain, mouth sores, postnasal drip, sinus pressure and trouble swallowing.    Eyes:  Negative for photophobia, pain and visual disturbance.   Respiratory:  Negative for cough, chest tightness, shortness of breath, wheezing and stridor.    Cardiovascular:  Negative for chest pain, palpitations and leg swelling.   Gastrointestinal:  Negative for abdominal distention, abdominal pain, blood in stool, constipation, diarrhea, nausea and vomiting.   Endocrine: Negative for polydipsia, polyphagia and polyuria.   Genitourinary:  Negative for decreased urine volume, difficulty urinating, dysuria, flank pain, frequency and urgency.   Musculoskeletal:  Positive for arthralgias, back pain,  myalgias and neck pain. Negative for gait problem and joint swelling.   Skin:  Negative for color change and rash.   Allergic/Immunologic: Negative for environmental allergies and food allergies.   Neurological:  Positive for numbness. Negative for dizziness, tremors, syncope, weakness, light-headedness and headaches.   Hematological:  Negative for adenopathy. Does not bruise/bleed easily.   Psychiatric/Behavioral:  Positive for decreased concentration, dysphoric mood and sleep disturbance. Negative for confusion. The patient is nervous/anxious.             Physical Exam:    Physical Exam  Constitutional:       General: He is awake.      Appearance: Normal appearance. He is well-developed. He is obese.   HENT:      Head: Normocephalic and atraumatic.      Right Ear: External ear normal.      Left Ear: External ear normal.      Nose: No nasal tenderness, mucosal edema, congestion or rhinorrhea.      Right Turbinates: Not enlarged or swollen.      Left Turbinates: Not enlarged or swollen.      Right Sinus: No maxillary sinus tenderness or frontal sinus tenderness.      Left Sinus: No maxillary sinus tenderness or frontal sinus tenderness.      Mouth/Throat:      Lips: Pink.      Mouth: Mucous membranes are moist. No oral lesions.      Dentition: Normal dentition. Does  not have dentures. No dental caries or gum lesions.      Tongue: No lesions.      Palate: No mass.      Pharynx: Oropharynx is clear. Uvula midline. No oropharyngeal exudate or posterior oropharyngeal erythema.      Comments: Unable to fully visualize the posterior pharynx.  Eyes:      General: Lids are normal.         Right eye: No discharge.         Left eye: No discharge.      Conjunctiva/sclera: Conjunctivae normal.   Neck:      Thyroid: No thyroid mass or thyroid tenderness.      Vascular: No carotid bruit.      Trachea: Trachea normal.   Cardiovascular:      Rate and Rhythm: Regular rhythm. Tachycardia present.      Pulses:           Radial pulses  are 2+ on the right side and 2+ on the left side.      Heart sounds: Normal heart sounds, S1 normal and S2 normal. No murmur heard.  Pulmonary:      Effort: Pulmonary effort is normal.      Breath sounds: Normal breath sounds and air entry. No decreased breath sounds, wheezing, rhonchi or rales.   Abdominal:      General: Abdomen is protuberant. Bowel sounds are normal.      Palpations: Abdomen is soft. There is no hepatomegaly, splenomegaly or mass.      Tenderness: There is no abdominal tenderness. There is no right CVA tenderness or left CVA tenderness.   Musculoskeletal:      Cervical back: Spasms present. No rigidity, tenderness or bony tenderness. Muscular tenderness present. No spinous process tenderness. Normal range of motion.      Thoracic back: Normal. No spasms, tenderness or bony tenderness.      Lumbar back: Normal. No spasms, tenderness or bony tenderness.      Right lower leg: No edema.      Left lower leg: No edema.   Lymphadenopathy:      Cervical: No cervical adenopathy.   Skin:     Comments: Multiple pigmented skin lesion noted   Neurological:      Mental Status: He is alert.   Psychiatric:         Attention and Perception: Attention and perception normal.         Mood and Affect: Mood and affect normal.         Speech: Speech normal.         Behavior: Behavior normal. Behavior is cooperative.         Thought Content: Thought content normal. Thought content does not include homicidal or suicidal ideation.      Comments: PHQ score 14        Little interest or pleasure in doing things: 1 (11/08/2021  9:31 AM)  Feeling down, depressed, or hopeless: 3 (11/08/2021  9:31 AM)  Trouble falling or staying asleep, or sleeping too much: 2 (night mares since mother passed away worsened) (11-08-2021  9:31 AM)  Feeling tired or having little energy: 2 (11-08-21  9:31 AM)  Feeling bad about yourself - or that you are a failure or have let yourself or your family down: 2 (over eating) (2021-11-08  9:31 AM)  Trouble  concentrating on things, such as reading the newspaper or watching television: 3 (2021-11-08  9:31 AM)  Moving or speaking so  slowly that other people could have noticed. Or the opposite - being so fidgety or restless that you have been moving around a lot more than usual: 1 (hyperactive) (11/05/2021  9:31 AM)  Thoughts that you would be better off dead, or of hurting yourself in some way: 0 (11/05/2021  9:31 AM)  PHQ Total Score: 14 (11/05/2021  9:31 AM)  If you checked off any problems, how difficult have these problems made it for you to do your work, take care of things at home, or get along with other people?: Somewhat difficult (11/05/2021  9:31 AM)        Follow-up:    Return in about 1 month (around 12/05/2021) for depression.         Gillermo Murdoch, MD           This note was generated using voice recognition software which may contain typographical, grammatical and word choice errors.

## 2021-11-05 NOTE — Assessment & Plan Note (Addendum)
In office influenza vaccine administered, vaccine side effects and return precautions advised.  Patient counseled to consider new bivalent COVID booster, patient will think about it.  He is not considering shingles vaccine at this time.

## 2021-11-05 NOTE — Assessment & Plan Note (Signed)
Referral provided for psychologist to consider ADHD evaluation.

## 2021-11-05 NOTE — Assessment & Plan Note (Signed)
PSA level ordered.

## 2021-11-05 NOTE — Assessment & Plan Note (Signed)
PHQ score 14 with no reported SI/HI.  Given concerns for cervical radiculopathy will give trial of Cymbalta to see if that helps and continue with Xanax to be used as needed for anxiety/panic attacks.  Medication side effects and safety profile discussed.  Detailed discussion done about red flag signs including SI/HI counseling and supportive care provided.  Will reevaluate in 4 to 6 weeks.

## 2021-11-05 NOTE — Progress Notes (Signed)
Have you seen any specialists/other providers since your last visit with [place]?    Yes    Do you agree to telemedicine visit?  No    Arm preference verified?   Yes, LEFT ARM    Health Maintenance Due   Topic Date Due    Advance Directive on File  Never done    Shingrix Vaccine 50+ (1) Never done    COVID-19 Vaccine (3 - Booster for Pfizer series) 07/17/2020    INFLUENZA VACCINE  Never done

## 2021-11-05 NOTE — Assessment & Plan Note (Signed)
Patient scheduled for colonoscopy with Dr. Roselyn Bering December 14, we will follow-up on records.

## 2021-11-06 DIAGNOSIS — Z23 Encounter for immunization: Secondary | ICD-10-CM

## 2021-12-04 DIAGNOSIS — Z87891 Personal history of nicotine dependence: Secondary | ICD-10-CM

## 2021-12-04 DIAGNOSIS — K219 Gastro-esophageal reflux disease without esophagitis: Secondary | ICD-10-CM | POA: Insufficient documentation

## 2021-12-04 DIAGNOSIS — Z1212 Encounter for screening for malignant neoplasm of rectum: Secondary | ICD-10-CM

## 2021-12-05 ENCOUNTER — Ambulatory Visit (INDEPENDENT_AMBULATORY_CARE_PROVIDER_SITE_OTHER): Payer: 59 | Admitting: Family Medicine

## 2021-12-10 ENCOUNTER — Telehealth (INDEPENDENT_AMBULATORY_CARE_PROVIDER_SITE_OTHER): Payer: Self-pay

## 2021-12-10 ENCOUNTER — Telehealth (INDEPENDENT_AMBULATORY_CARE_PROVIDER_SITE_OTHER): Payer: Self-pay | Admitting: Gastroenterology

## 2021-12-10 NOTE — Telephone Encounter (Signed)
Pt called at 1 pm today regarding needing to re-schedule his egd/cln at [facility] w/Dr. Roselyn Bering tomorrow at 2 pm. He has a family issue that will take him out of town

## 2021-12-11 ENCOUNTER — Ambulatory Visit: Admit: 2021-12-11 | Payer: Self-pay | Admitting: Gastroenterology

## 2021-12-11 DIAGNOSIS — Z87891 Personal history of nicotine dependence: Secondary | ICD-10-CM

## 2021-12-11 DIAGNOSIS — Z1212 Encounter for screening for malignant neoplasm of rectum: Secondary | ICD-10-CM

## 2021-12-11 DIAGNOSIS — K219 Gastro-esophageal reflux disease without esophagitis: Secondary | ICD-10-CM | POA: Insufficient documentation

## 2021-12-11 SURGERY — EGD, COLONOSCOPY
Anesthesia: Anesthesia MAC / Sedation | Site: Abdomen

## 2021-12-12 ENCOUNTER — Ambulatory Visit: Payer: 59 | Admitting: Anesthesiology

## 2021-12-26 ENCOUNTER — Encounter (INDEPENDENT_AMBULATORY_CARE_PROVIDER_SITE_OTHER): Payer: Self-pay | Admitting: Family Medicine

## 2021-12-30 DIAGNOSIS — I824Z9 Acute embolism and thrombosis of unspecified deep veins of unspecified distal lower extremity: Secondary | ICD-10-CM

## 2021-12-30 DIAGNOSIS — J984 Other disorders of lung: Secondary | ICD-10-CM

## 2021-12-30 HISTORY — DX: Acute embolism and thrombosis of unspecified deep veins of unspecified distal lower extremity: I82.4Z9

## 2021-12-30 HISTORY — DX: Other disorders of lung: J98.4

## 2022-01-16 ENCOUNTER — Ambulatory Visit (INDEPENDENT_AMBULATORY_CARE_PROVIDER_SITE_OTHER): Payer: 59 | Admitting: Gastroenterology

## 2022-01-29 ENCOUNTER — Telehealth (INDEPENDENT_AMBULATORY_CARE_PROVIDER_SITE_OTHER): Payer: Self-pay | Admitting: Family Medicine

## 2022-01-29 DIAGNOSIS — F332 Major depressive disorder, recurrent severe without psychotic features: Secondary | ICD-10-CM

## 2022-01-29 MED ORDER — ALPRAZOLAM 0.5 MG PO TABS
0.5000 mg | ORAL_TABLET | Freq: Two times a day (BID) | ORAL | 0 refills | Status: DC | PRN
Start: 2022-01-29 — End: 2022-07-25

## 2022-01-29 NOTE — Telephone Encounter (Signed)
Pt called to request Rx refill:  ALPRAZolam (Xanax) 0.5 MG tablet     Last OV: 11/05/21          [id number] -  [place] - [address] AT [address]

## 2022-01-29 NOTE — Addendum Note (Signed)
Addended by: Gillermo Murdoch on: 01/29/2022 11:35 AM     Modules accepted: Orders

## 2022-01-29 NOTE — Telephone Encounter (Signed)
Temporary refill done for now.  Patient was advised to follow-up for annual physical.  Please assist in making appointment.

## 2022-01-29 NOTE — Telephone Encounter (Signed)
LVM - informing pt temporary refill was sent to pharmacy and to call us back to schedule annual physical  appt.

## 2022-02-05 ENCOUNTER — Ambulatory Visit: Payer: 59 | Admitting: Anesthesiology

## 2022-02-05 ENCOUNTER — Encounter
Admission: RE | Disposition: A | Discharge status: Home / Self Care | Payer: Self-pay | Source: Ambulatory Visit | Attending: Gastroenterology

## 2022-02-05 ENCOUNTER — Encounter: Payer: Self-pay | Admitting: Gastroenterology

## 2022-02-05 ENCOUNTER — Ambulatory Visit: Payer: Self-pay

## 2022-02-05 ENCOUNTER — Ambulatory Visit
Admission: RE | Admit: 2022-02-05 | Discharge: 2022-02-05 | Disposition: A | Discharge status: Home / Self Care | Payer: 59 | Source: Ambulatory Visit | Attending: Gastroenterology | Admitting: Gastroenterology

## 2022-02-05 DIAGNOSIS — K529 Noninfective gastroenteritis and colitis, unspecified: Secondary | ICD-10-CM | POA: Insufficient documentation

## 2022-02-05 DIAGNOSIS — Z87891 Personal history of nicotine dependence: Secondary | ICD-10-CM

## 2022-02-05 DIAGNOSIS — K635 Polyp of colon: Secondary | ICD-10-CM | POA: Insufficient documentation

## 2022-02-05 DIAGNOSIS — Z8711 Personal history of peptic ulcer disease: Secondary | ICD-10-CM

## 2022-02-05 DIAGNOSIS — K2950 Unspecified chronic gastritis without bleeding: Secondary | ICD-10-CM | POA: Insufficient documentation

## 2022-02-05 DIAGNOSIS — Z1211 Encounter for screening for malignant neoplasm of colon: Secondary | ICD-10-CM | POA: Insufficient documentation

## 2022-02-05 DIAGNOSIS — K5730 Diverticulosis of large intestine without perforation or abscess without bleeding: Secondary | ICD-10-CM

## 2022-02-05 DIAGNOSIS — K2970 Gastritis, unspecified, without bleeding: Secondary | ICD-10-CM

## 2022-02-05 DIAGNOSIS — F17210 Nicotine dependence, cigarettes, uncomplicated: Secondary | ICD-10-CM | POA: Insufficient documentation

## 2022-02-05 DIAGNOSIS — K2980 Duodenitis without bleeding: Secondary | ICD-10-CM

## 2022-02-05 DIAGNOSIS — K219 Gastro-esophageal reflux disease without esophagitis: Secondary | ICD-10-CM

## 2022-02-05 DIAGNOSIS — K649 Unspecified hemorrhoids: Secondary | ICD-10-CM

## 2022-02-05 DIAGNOSIS — R109 Unspecified abdominal pain: Secondary | ICD-10-CM

## 2022-02-05 DIAGNOSIS — Z1212 Encounter for screening for malignant neoplasm of rectum: Secondary | ICD-10-CM

## 2022-02-05 HISTORY — DX: Other specified postprocedural states: Z98.890

## 2022-02-05 HISTORY — DX: Personal history of other diseases of the digestive system: Z87.19

## 2022-02-05 HISTORY — PX: EGD, COLONOSCOPY: SHX3799

## 2022-02-05 SURGERY — EGD, COLONOSCOPY
Anesthesia: Anesthesia General | Site: Abdomen

## 2022-02-05 MED ORDER — FENTANYL CITRATE (PF) 50 MCG/ML IJ SOLN (WRAP)
INTRAMUSCULAR | Status: AC
Start: 2022-02-05 — End: ?
  Filled 2022-02-05: qty 2

## 2022-02-05 MED ORDER — PROPOFOL INFUSION 10 MG/ML
INTRAVENOUS | Status: DC | PRN
Start: 2022-02-05 — End: 2022-02-05
  Administered 2022-02-05: 160 ug/kg/min via INTRAVENOUS

## 2022-02-05 MED ORDER — PANTOPRAZOLE SODIUM 40 MG PO TBEC
40.0000 mg | DELAYED_RELEASE_TABLET | Freq: Every day | ORAL | 11 refills | Status: DC
Start: 2022-02-05 — End: 2023-02-16

## 2022-02-05 MED ORDER — FENTANYL CITRATE (PF) 50 MCG/ML IJ SOLN (WRAP)
INTRAMUSCULAR | Status: DC | PRN
Start: 2022-02-05 — End: 2022-02-05
  Administered 2022-02-05: 25 ug via INTRAVENOUS
  Administered 2022-02-05: 50 ug via INTRAVENOUS
  Administered 2022-02-05: 25 ug via INTRAVENOUS

## 2022-02-05 MED ORDER — GLYCOPYRROLATE 0.2 MG/ML IJ SOLN (WRAP)
INTRAMUSCULAR | Status: DC | PRN
Start: 2022-02-05 — End: 2022-02-05
  Administered 2022-02-05: .2 mg via INTRAVENOUS

## 2022-02-05 MED ORDER — LACTATED RINGERS IV SOLN
INTRAVENOUS | Status: DC | PRN
Start: 2022-02-05 — End: 2022-02-05

## 2022-02-05 MED ORDER — LIDOCAINE HCL 2 % IJ SOLN
INTRAMUSCULAR | Status: DC | PRN
Start: 2022-02-05 — End: 2022-02-05
  Administered 2022-02-05: 80 mg via INTRAVENOUS

## 2022-02-05 MED ORDER — PROPOFOL INFUSION 10 MG/ML
INTRAVENOUS | Status: DC | PRN
Start: 2022-02-05 — End: 2022-02-05
  Administered 2022-02-05: 100 mg via INTRAVENOUS

## 2022-02-05 SURGICAL SUPPLY — 36 items
BLOCK BITE MAXI 60FR LF STRD STRAP SDPRT (Procedure Accessories) ×2
BLOCK BITE OD60 FR STURDY STRAP SIDEPORT (Procedure Accessories) ×1
BLOCK BITE OD60 FR STURDY STRAP SIDEPORT DENTAL RETENTION RIM MAXI (Procedure Accessories) ×1 IMPLANT
CANISTER WND DRN 1000ML INFOVAC LF STRL (Procedure Accessories) ×2
CANISTER WOUND DRAINAGE GEL INFOVAC 1000 (Procedure Accessories) ×1
CANISTER WOUND DRAINAGE GEL INFOVAC 1000 ML VAC ULTA THERAPY SYSTEM (Procedure Accessories) ×1 IMPLANT
CONTAINER HISTOLOGY 60 ML 30 ML GRADUATE LEAK RESISTANT O RING PREFILL (Procedure Accessories) IMPLANT
ELECTRODE ADULT PATIENT RETURN L9 FT REM POLYHESIVE ACRYLIC FOAM (Procedure Accessories) IMPLANT
ELECTRODE PATIENT RETURN L9 FT VALLEYLAB (Procedure Accessories)
ELECTRODE PT RTN RM PHSV ACRL FM C30- LB (Procedure Accessories)
FORCEPS BIOPSY L240 CM MICROMESH TEETH STREAMLINE CATHETER NEEDLE (Instrument) IMPLANT
FORCEPS BIOPSY L240 CM STANDARD CAPACITY (Instrument) ×1
FORCEPS BX STD CPC RJ 4 2.2MM 240CM STRL (Instrument) ×2
GLOVE EXAM LARGE NITRILE CHEMOTHERAPY POWDER FREE SENSE OATMEAL (Glove) ×1 IMPLANT
GLOVE EXAM LARGE NITRILE POWDER FREE SENSE OATMEAL (Glove) ×1 IMPLANT
GLOVE EXAM NITRILE RESTORE LG (Glove) ×1
GLV EXAM NITRILE RESTORE LG (Glove) ×2
GOWN ISL PP PE REG LG LF FULL BCK NK TIE (Gown) ×2
GOWN ISOLATION REGULAR LARGE FULL BACK NECK TIE ELASTIC CUFF (Gown) ×1 IMPLANT
KIT ENDO W/ ORCA AND SEAL ONLY (Kits) ×2
KIT ENDOSCOPIC COMPLIANCE ENDOKIT (Kits) ×1
KIT ENDOSCOPIC COMPLIANCE ENDOKIT ORCAPOD 3 1.1 OZ (Kits) ×1 IMPLANT
SNARE ESCP MIC CPTVTR 13MM 240IN STRL (GE Lab Supplies)
SNARE ESCP RND CPTVTR 2.4MM 240CM CLD (Procedure Accessories) ×2
SNARE OD10 MM CAPTIVATOR ENDOSCOPIC POLYPECTOMY (Procedure Accessories) IMPLANT
SNARE SMALL HEXAGON CAPTIVATOR STIFF ENDOSCOPIC POLYPECTOMY (GE Lab Supplies) IMPLANT
SOL FORMALIN 10% PREFILL 30ML (Procedure Accessories)
SYRINGE 50 ML GRADUATE NONPYROGENIC DEHP (Syringes, Needles) ×1
SYRINGE 50 ML GRADUATE NONPYROGENIC DEHP FREE PVC FREE BD MEDICAL (Syringes, Needles) ×1 IMPLANT
SYRINGE MED 50ML LF STRL GRAD N-PYRG (Syringes, Needles) ×2
TRAP 1 CHAMBER ENDO SUCTION QUICK CATCH (Endoscopic Supplies)
TRAP 1 CHAMBER ENDO SUCTION QUICK CATCH WIDE-EYE SPECIMEN POLYP (Endoscopic Supplies) IMPLANT
TRAP SPEC W-EYE 1 CHMBR ENDO SCT QCK (Endoscopic Supplies)
TRAP SPEC W-EYE TRNS LID 1 CHMBR INLN (Endoscopic Supplies)
WATER STERILE PVC FREE DEHP FREE 1000 ML (Solution) ×1 IMPLANT
WATER STRL 1000ML PIC LF PVC FR DEHP-FR (Solution) ×2

## 2022-02-05 NOTE — Discharge Instructions (Addendum)
Gastritis (Adult)  Gastritis is inflammation and irritation of the stomach lining. You can have it for a short time (acute) or it can be long lasting (chronic). Infection with bacteria called H. pylori most often causes gastritis. More than 1 out of 3 people in the [place] have these bacteria in their bodies. In many cases, H. pylori causes no problems or symptoms. But in some people, the infection irritates the stomach lining and causes gastritis. H. pylori may be diagnosed through blood, stool, or breath tests, or by a biopsy during an endoscopy. Other causes of stomach irritation include drinking alcohol, smoking or chewing tobacco, or taking pain-relieving medicines called nonsteroidal anti-inflammatory drugs (NSAIDs), such as aspirin or ibuprofen. Some illegal drugs (such as cocaine) and immune conditions can also cause gastritis.   Symptoms of gastritis can include:  Belly pain or bloating  Feeling full quickly  Loss of appetite  Nausea or vomiting  Vomiting blood or having black stools  Feeling more tired than normal  An inflamed and irritated stomach lining is more likely to develop a sore called an ulcer. To help prevent this, gastritis should be evaluated and treated as soon as symptoms occur.   Home care  If needed, your healthcare provider may prescribe medicines. If you have H. pylori infection, treating it will likely ease your symptoms. Other changes can help reduce stomach irritation and help it heal.   Take prescription medicines as directed. If you have been prescribed medicines for H. pylori infection, take them as directed. Take all of the medicine until it's finished or until your provider tells you to stop taking it, even if you start to feel better.  Follow your healthcare provider's advice on NSAIDs. Your provider may advise you not to take NSAIDs. If you take daily aspirin for your heart or other health reasons, don't stop without talking with your provider first.  Don't drink alcohol. If you  need help stopping your use of alcohol, ask your provider for treatment resources.  Stop smoking. Smoking can irritate the stomach and delay healing. As much as possible, stay away from secondhand smoke. If you smoke and have trouble stopping, ask your provider for help.  Follow-up care  Follow up with your healthcare provider, or as advised. You may need testing to check for inflammation or an ulcer.   When to get medical advice  Call your healthcare provider for any of the following:  Stomach pain that gets worse or moves to the lower right belly (appendix area)  Chest pain that suddenly appears or gets worse, or spreads to the back, neck, shoulder, or arm  Frequent vomiting (can't keep down liquids)  Blood in the stool or vomit (red or black in color)  Feeling weak or dizzy  Shortness of breath  Unexplained weight loss  Fever of 100.73F (38C) or higher, or as directed by your healthcare provider  Symptoms that get worse, or new symptoms  StayWell last reviewed this educational content on 01/31/2020   2000-2022 [employer], [place]. All rights reserved. This information is not intended as a substitute for professional medical care. Always follow your healthcare professional's instructions.        Duodenitis  The duodenum is the first part of the small intestine. It's just past the stomach. Duodenitis is inflammation of the lining of the duodenum. This sheet tells you more about this health problem.   Causes of duodenitis  The most common cause of duodenitis is infection by H. pylori (Helicobacter  pylori) bacteria. You can also get this health problem if you:   Take nonsteroidal anti-inflammatory drugs (NSAIDs), such as aspirin and ibuprofen, for a long time  Have celiac disease, an allergy to gluten  Have Crohn's disease  Drink alcohol  Smoke  Symptoms of duodenitis  The condition may cause no symptoms. If symptoms do happen, you may have:   Burning, cramping, or hunger-like pain in your stomach  Gas or a  bloated feeling  Upset stomach (nausea) and vomiting  A full feeling soon after starting a meal  Diagnosing duodenitis  If your healthcare provider thinks you have duodenitis, you may have these tests to make sure:   Upper endoscopy with biopsy. During this test, a thin, flexible tube with a light and camera on the end (endoscope) is used. Your healthcare provider moves the scope down your throat to the stomach and into the duodenum. The scope sends images of the duodenum to a video screen. Small samples (biopsy) of the lining of the duodenum may be taken. These samples  will be sent to a lab for testing for H. pylori.  Blood, stool, gastric biopsy, or breath test. These tests can check for H. pylori or other germs. Samples of blood or stool are taken and tested in a lab. Blood tests can also check for celiac disease. For a breath test, you swallow a harmless compound. If H. pylori bacteria are in your body, extra carbon dioxide gas can then be found in your breath.  Upper gastrointestinal (GI) series (in rare cases).  This test can give more information about the digestive tract. It takes X-rays of the upper GI tract from the mouth to the small intestine.  Treating duodenitis  If you have duodenitis, 1 or more of these treatments may help:   Taking antibiotic medicines to kill H. pylori  Taking medicines to reduce the amount of acid the stomach makes  Not taking NSAIDs such as aspirin and ibuprofen. But if you take aspirin for a health problem, such as heart disease or stroke, don't stop until you check with your healthcare provider. If you take NSAIDs for arthritis or pain, check with your provider about other choices.  Adopting a gluten-free diet if celiac disease is the cause  Not drinking alcohol  Stopping smoking  Your healthcare provider can tell you more about what treatments are needed.  Recovery and follow-up  With treatment, most cases of duodenitis go away. In rare cases, it can be an ongoing (chronic)  problem or progress into a duodenal ulcer. If your symptoms don't get better or they go away and come back, tell your healthcare provider. In such cases, checkups and treatments are needed to handle the health problem.    When to call the healthcare provider  Call your healthcare provider if you have any of these:   Fever of 100.4 F ( 38.0C) or higher, or as advised by your provider  Chills  Nausea or vomiting (vomit may be bloody or look like coffee grounds)  Dark, tarry, or bloody stools  Sudden or severe stomach pain  Pain that doesn't get better with treatment  Rapid weight loss  StayWell last reviewed this educational content on 09/29/2020   2000-2022 [employer], [place]. All rights reserved. This information is not intended as a substitute for professional medical care. Always follow your healthcare professional's instructions.        Understanding Colon and Rectal Polyps  The colon (also called the large intestine) is  a muscular tube that forms the last part of the digestive tract. It absorbs water and stores food waste. The colon is about 4 to 6 feet long. The rectum is the last 6 inches of the colon. The colon and rectum have a smooth lining composed of millions of cells. Changes in these cells can lead to growths called polyps in the colon. These can become cancerous and should be removed. Many tests are available to screen for colon cancer. But colonoscopy is the only test that looks directly into the entire large intestine and allows for treatment right away. During colonoscopy, these polyps can be removed. How often you need this test depends on many things. These include your condition, your family history, symptoms, and what the findings were at the previous colonoscopy.    When the colon lining changes  Changes that happen in the cells that line the colon or rectum can lead to growths called polyps. Over a period of years, polyps can turn into cancer. Removing polyps early may prevent cancer  from ever forming.   Polyps  Polyps are fleshy clumps of tissue that form on the lining of the colon or rectum. Small polyps are usually not cancer (benign). But over time, cells in a polyp can change and become precancerous. Certain types of polyps known as adenomatous polyps and serrated polyps are precancerous. The risk for cancer also increases with the size of the polyp and certain cell and gene features. This means that they may become cancerous if they're not removed. Hyperplastic polyps are benign. They can grow quite large and not turn cancerous.      Cancer  Almost all colorectal cancers start when polyp cells start growing abnormally. As a cancerous tumor grows, it may affect more and more of the colon or rectum. In time, cancer can also grow beyond the colon or rectum and spread to nearby organs or to glands called lymph nodes. The cells can also travel to other parts of the body. This is known as metastasis. The earlier a cancerous tumor is removed, the better the chance of preventing its spread.     StayWell last reviewed this educational content on 09/29/2020   2000-2022 [employer], [place]. All rights reserved. This information is not intended as a substitute for professional medical care. Always follow your healthcare professional's instructions.        Hemorrhoids  Hemorrhoids are swollen and inflamed veins inside the rectum and near the anus. The rectum is the last several inches of the colon. The anus is the passage between the rectum and the outside of the body.   Causes  The veins can become swollen because of increased pressure in them. This is most often caused by:   Long-term (chronic) constipation or diarrhea  Straining when having a bowel movement  Sitting too long on the toilet  A low-fiber diet  Pregnancy  Weakening of supporting tissues of the anus and rectum from aging  Symptoms  Bleeding from the rectum. You may notice this after bowel movements. If you see blood from bowel  movements, talk with your provider. They can determine if it's likely to be hemorrhoids, or if you need more assessment.  Lump near the anus  Itching around the anus  Pain around the anus  Mucus leaks from the anus  There are different types of hemorrhoids. Depending on the type you have and the severity, you may be able to treat yourself at home. In some cases, certain  procedures may be the best treatment options. Your healthcare provider can tell you more about this, if needed.   Home care  General care  To get relief from pain or itching, try:  Medicines. Your healthcare provider may recommend stool softeners, suppositories, or laxatives to help manage constipation. Use these exactly as directed.  Sitz baths. A sitz bath involves sitting in a few inches of warm bath water. Be careful not to make the water so hot that you burn yourself--test it before sitting in it. Soak for about 10 to 15 minutes a few times a day. This may help relieve pain.  Topical products. Your healthcare provider may prescribe or recommend creams, ointments, or pads that can be applied to the hemorrhoid. Use these exactly as directed.  Tips to help prevent hemorrhoids   Eat more fiber. Fiber adds bulk to stool and absorbs water as it moves through your colon. This makes stool softer and easier to pass.  Increase the fiber in your diet with more fiber-rich foods. These include fresh fruit, vegetables, and whole grains.  Take a fiber supplement or bulking agent, if advised by your healthcare provider. These include products such as psyllium or methylcellulose.  Drink more water. Your healthcare provider may direct you to drink plenty of water. This can help keep stool soft.  Be more active. Frequent exercise aids digestion and helps prevent constipation. It may also help make bowel movements more regular.  Don't strain during bowel movements. This can make hemorrhoids more likely. Also, don't sit on the toilet for long periods of  time.    Follow-up care  Follow up with your healthcare provider as advised. If a culture or imaging tests were done, someone will let you know the results when they are ready. This may take a few days or longer. If your healthcare provider recommends a procedure for your hemorrhoids, these options can be discussed. Options may include surgery and outpatient office treatments.   When to seek medical advice  Call your healthcare provider right away if any of these occur:   Increased bleeding from the rectum  Increased pain around the rectum or anus  Weakness or dizziness  Call 911  Call 911 if any of these occur:   Trouble breathing or swallowing  Fainting or loss of consciousness  Unusually fast heart rate  Vomiting blood  Large amounts of blood in stool or black, tarry stools  StayWell last reviewed this educational content on 12/30/2020   2000-2022 [employer], [place]. All rights reserved. This information is not intended as a substitute for professional medical care. Always follow your healthcare professional's instructions.        Diverticulosis  Diverticulosis means that small pouches have formed in the wall of your large intestine (colon). Most often, this problem causes no symptoms and is common as people age. But the pouches in the colon are at risk of becoming infected. When this happens, the condition is called diverticulitis. Although most people with diverticulosis never develop diverticulitis, it's still not uncommon. Rectal bleeding can also occur and in less common situations, a type of colon inflammation called colitis.   Most people don't have symptoms,. But some people with diverticulosis may have:   Belly (abdominal) cramps and pain  Bloating  Constipation  Change in bowel habits  Causes  The exact cause of diverticulosis (and diverticulitis) has not been proved, but a few things are linked with the condition:   Low-fiber diet. But some experts don't  agree about this.  Constipation  Lack of  exercise  Your healthcare provider will talk with you about how to manage your condition. Diet changes may be all that you need to help control diverticulosis and prevent its becoming diverticulitis. If you develop diverticulitis, you will likely need other treatments.   Home care  You may be told to take fiber supplements daily. Fiber adds bulk to the stool so that it passes through the colon more easily. Stool softeners may also be recommended. You may also be given medicines for pain relief. Be sure to take all medicines as directed.   In the past, people were told to not have corn, nuts, or seeds. You no longer need to do this.   Follow these guidelines when caring for yourself at home:  Eat unprocessed foods that are high in fiber. Whole grains, fruits, and vegetables are good choices.  Drink 6 to 8 glasses of water every day unless your healthcare provider has you limit how much fluid you should have.  Watch for changes in your bowel movements. Tell your provider if you notice any changes.  Begin an exercise program. Ask your provider how to get started. Generally, walking is the best.  Get plenty of rest and sleep.  Follow-up care  Follow up with your healthcare provider, or as advised. You may need regular visits to check on your health. Sometimes you may need special procedures such as colonoscopy after an episode of diverticulitis or blooding. Be sure to keep all your appointments.   If a stool sample was taken, or cultures were done, you'll be told if they are positive, or if your treatment needs to be changed. You can call as directed for the results.   If X-rays were done, you'll be told of any new findings that may affect your care.   If antibiotics were prescribed, be sure to finish them all.  When to seek medical advice  Call your healthcare provider right away if any of these occur:  Fever of 100.7F (38C) or higher , or as directed by your healthcare provider  Severe cramps in the lower left side  of the belly (abdomen) or pain that's getting worse  Tenderness in the lower left side of the abdomen or pain throughout the abdomen that gets worse  Diarrhea or constipation that doesn't get better within 24 hours  Nausea and vomiting  Slight bleeding from the rectum  Call 911  Call 911 if any of the following occur:   Large amount of bleeding from the rectum  Trouble breathing  Confusion  Very drowsy or trouble awakening  Fainting or loss of consciousness  Rapid heart rate  Chest pain  StayWell last reviewed this educational content on 12/30/2020   2000-2022 [employer], [place]. All rights reserved. This information is not intended as a substitute for professional medical care. Always follow your healthcare professional's instructions.

## 2022-02-05 NOTE — Transfer of Care (Signed)
Anesthesia Transfer of Care Note    Patient: Bill Kaiser    Procedures performed: Procedure(s):  EGD, COLONOSCOPY    Anesthesia type: General TIVA    Patient location:[facility]    Last vitals:   Vitals:    02/05/22 1529   BP: (!) 145/91   Pulse: 93   Resp: 17   Temp: 36.4 C (97.5 F)   SpO2: 96%       Post pain: Patient not complaining of pain, continue current therapy      Mental Status:awake    Respiratory Function: tolerating face mask    Cardiovascular: stable    Nausea/Vomiting: patient not complaining of nausea or vomiting    Hydration Status: adequate    Post assessment: no apparent anesthetic complications, no reportable events and no evidence of recall    Signed by: Isidoro Donning, CRNA  02/05/22 3:29 PM

## 2022-02-05 NOTE — Anesthesia Preprocedure Evaluation (Addendum)
Anesthesia Evaluation    AIRWAY    Mallampati: I    TM distance: >3 FB  Neck ROM: full  Mouth Opening:full  Planned to use difficult airway equipment: No CARDIOVASCULAR           DENTAL         PULMONARY         OTHER FINDINGS                  Relevant Problems   GI   (+) Gastroesophageal reflux disease without esophagitis      OTHER   (+) H/O asplenia               Anesthesia Plan    ASA 3     general                                                    Signed by: Alvan Dame, DO 02/05/22 12:54 PM

## 2022-02-05 NOTE — Discharge Instr - AVS First Page (Addendum)
Instructions for after your discharge:  General Instructions:  Dr. Szary [phone]    Following sedation, your judgment, perception, and coordination are considered impaired. Even though you may feel awake and alert, you are considered legally intoxicated. Therefore, until the next morning:  Do not drive  Do not operate appliances or equipment that requires reaction time (e.g. stove, electrical tools, machinery)  Do not sign legal documents or be involved in important decisions  Do not smoke if alone  Do not drink alcoholic beverages    Go directly home and rest for several hours before resuming your routine activities  It is highly recommended to have a responsible adult stay with you for the next 24 hours    Tenderness, swelling, or pain may occur at the IV site where you received sedation. If you experience this, apply warm soaks to the area. Notify your physician if this persists.   If you have questions or problems contact your MD immediately. If you need immediate attention, call your MD, 911 and/or go to the nearest emergency room.       Report to the Physician any of the following:    For Upper Endoscopy, ERCP, EUS, or Dilations:  Pain in chest  Nausea/vomiting  Fever/chills within 24 hours after procedure. Temp > 101 degrees Farenheit  Severe and persistent abdominal pain or bloating  * Mild throat soreness may follow this procedure. Warm salt water gargling, lozenges, or cold drink and popscicles of your choice will most likely relieve your discomfort      For Colonoscopy, Sigmoidoscopy, or Proctoscopy:  Severe and persistent abdominal pain/bloating which does not subside within 2-3 hours  Large amount of rectal bleeding (some mucous blood streaking may occur, especially if biopsy or polypectomy was done or if hemorrhoids are present  Nausea/vomiting  Fever/chills within 24 hours after procedure. Temp > 101 degrees Farenheit  * If polyp has been removed, DO NOT take aspirin or aspirin-containing  products (e.g. Anacin, Alka Seltzer, Bufferin, etc) or non-steroidal anti-inflammatory drugs (e.g. Advil, Motrin, etc) for 2 days unless otherwise advised by doctor. Tylenol or Extra Strength Tylenol is permitted.   *Special instructions: Follow MD instructions on procedural report.       Upper GI        Lower GI

## 2022-02-05 NOTE — Anesthesia Postprocedure Evaluation (Signed)
Anesthesia Post Evaluation    Patient: Bill Kaiser    Procedure(s):  EGD, COLONOSCOPY    Anesthesia type: general    Last Vitals:   Vitals Value Taken Time   BP 135/92 02/05/22 1555   Temp 36.4 C (97.5 F) 02/05/22 1529   Pulse 89 02/05/22 1555   Resp 20 02/05/22 1555   SpO2 97 % 02/05/22 1555                 Anesthesia Post Evaluation:     Patient Evaluated: PACU    Level of Consciousness: sleepy but conscious    Pain Management: adequate    Airway Patency: patent    Anesthetic complications: No      PONV Status: none    Cardiovascular status: acceptable  Respiratory status: acceptable  Hydration status: acceptable        Signed by: Harriet Butte, MD, 02/05/2022 4:20 PM
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
## 2022-02-06 NOTE — Op Note (Signed)
RN did postop phone calls. Patient reported that he had a BM yesterday afternoon, looked normal. He had another one today, with few drops of bright red blood noted in the stool. RN advised patient to closely monitor and observe. Patient said he was not on any blood thinner. Patient will call MD if having more bleeding.

## 2022-02-07 ENCOUNTER — Encounter: Payer: Self-pay | Admitting: Gastroenterology

## 2022-02-12 ENCOUNTER — Encounter (INDEPENDENT_AMBULATORY_CARE_PROVIDER_SITE_OTHER): Payer: Self-pay | Admitting: Gastroenterology

## 2022-02-12 LAB — LAB USE ONLY - HISTORICAL SURGICAL PATHOLOGY

## 2022-02-13 NOTE — Telephone Encounter (Signed)
No signs of cancer found.  We did not see Barretts esophagus

## 2022-04-10 ENCOUNTER — Ambulatory Visit: Payer: 59 | Admitting: Physician Assistant

## 2022-04-30 ENCOUNTER — Telehealth (INDEPENDENT_AMBULATORY_CARE_PROVIDER_SITE_OTHER): Payer: Self-pay | Admitting: Family Nurse Practitioner

## 2022-04-30 NOTE — Telephone Encounter (Signed)
Please set recall for colonoscopy for 5 years.    Thank you!

## 2022-05-24 ENCOUNTER — Ambulatory Visit: Payer: 59 | Admitting: Anesthesiology

## 2022-05-28 ENCOUNTER — Encounter: Payer: Self-pay | Admitting: Anesthesiology

## 2022-06-04 ENCOUNTER — Ambulatory Visit
Admission: RE | Admit: 2022-06-04 | Discharge: 2022-06-04 | Disposition: A | Discharge status: Home / Self Care | Payer: 59 | Source: Ambulatory Visit | Attending: Anesthesiology | Admitting: Anesthesiology

## 2022-06-04 ENCOUNTER — Encounter: Payer: Self-pay | Admitting: Anesthesiology

## 2022-06-04 VITALS — BP 155/100 | HR 68 | Temp 98.2°F | Resp 15 | Wt 227.3 lb

## 2022-06-04 DIAGNOSIS — M5412 Radiculopathy, cervical region: Secondary | ICD-10-CM

## 2022-06-04 DIAGNOSIS — Z79899 Other long term (current) drug therapy: Secondary | ICD-10-CM | POA: Insufficient documentation

## 2022-06-04 DIAGNOSIS — M4802 Spinal stenosis, cervical region: Secondary | ICD-10-CM | POA: Insufficient documentation

## 2022-06-04 MED ORDER — BETAMETHASONE SOD PHOS & ACET 6 (3-3) MG/ML IJ SUSP
6.0000 mg | Freq: Once | INTRAMUSCULAR | Status: AC
Start: 2022-06-04 — End: 2022-06-04
  Administered 2022-06-04: 6 mg via INTRAMUSCULAR

## 2022-06-04 MED ORDER — BETAMETHASONE SOD PHOS & ACET 6 (3-3) MG/ML IJ SUSP
INTRAMUSCULAR | Status: AC
Start: 2022-06-04 — End: ?
  Filled 2022-06-04: qty 5

## 2022-06-04 MED ORDER — IOHEXOL 240 MG/ML IJ SOLN
3.0000 mL | Freq: Once | INTRAMUSCULAR | Status: AC
Start: 2022-06-04 — End: 2022-06-04
  Administered 2022-06-04: 3 mL

## 2022-06-04 NOTE — Patient Instructions (Signed)
[facility]  [facility]  [address], [address]  [place], [place] [address]  T: [phone]    What to expect    It is normal to feel improvement in pain for a few hours if local anesthetic used for the procedure.  If no local anesthetic used for the procedure, or once the local anesthetic wears off, you may feel worsening of your pain for a few days.  If you are tender at the injection site you may use an ice pack wrapped in a towel for twenty (20) minutes three to four times a day    The pain relief from the steroid typically takes 2 to 5 days to see an effect.    Instructions    No immersion in water for 24 hours (no bath, no hot tub, no Jacuzzi etc.)  May shower after 6 hours if desired    You may resume your normal diet and medication regimen unless directed otherwise by your provider.  If you are taking a blood thinner, ask when it is acceptable to restart, as this will vary depending upon the medication and the procedure you underwent    You may resume regular activities as your comfort level allows, but do not do more than you would on a typical day    You will have a discoloration from the skin cleanser used for the procedure in the area where your procedure was performed.  The current recommendation is to not wash the antiseptic off immediately following the procedure, unless it is causing problems like itching or rash.  The disinfectant used is typically colored blue or orange.  This is normal and you may wash it off after 2 hours if desired.    You may remove bandage that we placed over the injection site after 2 hours, if desired    Potential Side effects    Temporary side effects of steroids could include:  Redness in the face  Swelling in the extremities  Insomnia  Short term Increase in blood sugar level or blood pressure.  Changes in mood    Cataracts, Glaucoma, hair loss and osteopenia/osteoporosis have been associated with  long term or excessive use of steroids    If local anesthesia used for the procedure, you may have mild and temporary numbness or weakness.  This typically resolves in a few hours.  Please have someone assist you with ambulation, and do not drive during this time period, if you notice weakness or heaviness.  If you notice severe numbness or weakness please call the office (or if after hours go to emergency room for evaluation).  If the numbness or weakness does not resolve after 12 hours please call the office.    When to call the office    Please call the office for the following:  Temperature greater than 100.3 F  Severe headache  Severe back pain that does not go away with over-the-counter medications (or if after hours go to emergency room for evaluation).   New onset numbness or weakness that is progressively getting worse (or if after hours go to emergency room for evaluation).   Redness at the injection site that is getting bigger, or discharge from the injection site

## 2022-06-04 NOTE — Addendum Note (Signed)
Encounter addended by: Denton Lank, LPN on: 01/04/1095 4:33 PM   Actions taken: Flowsheet accepted, MAR administration accepted
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
## 2022-07-05 ENCOUNTER — Ambulatory Visit: Payer: 59 | Admitting: Physician Assistant

## 2022-07-15 ENCOUNTER — Telehealth: Payer: Self-pay

## 2022-07-15 NOTE — Telephone Encounter (Signed)
Called patient to follow up in regards to previous injection appointment on 06/04/22. No answer, left voicemail.

## 2022-07-16 ENCOUNTER — Telehealth: Payer: Self-pay

## 2022-07-16 ENCOUNTER — Telehealth (INDEPENDENT_AMBULATORY_CARE_PROVIDER_SITE_OTHER): Payer: Commercial Managed Care - HMO | Admitting: Family Medicine

## 2022-07-16 NOTE — Telephone Encounter (Signed)
Called patient to follow up in regards to previous injection appointment on 06/04/22. No answer, left voicemail.

## 2022-07-19 ENCOUNTER — Ambulatory Visit: Payer: 59 | Admitting: Anesthesiology

## 2022-07-22 ENCOUNTER — Telehealth (INDEPENDENT_AMBULATORY_CARE_PROVIDER_SITE_OTHER): Payer: Commercial Managed Care - HMO | Admitting: Family Medicine

## 2022-07-23 ENCOUNTER — Telehealth: Payer: Self-pay

## 2022-07-23 NOTE — Telephone Encounter (Signed)
Pt is contacted for a follow up phone call post procedure. No answer at this time. Pt is notified to call as back at [phone].

## 2022-07-25 ENCOUNTER — Encounter (INDEPENDENT_AMBULATORY_CARE_PROVIDER_SITE_OTHER): Payer: Self-pay | Admitting: Family Medicine

## 2022-07-25 ENCOUNTER — Ambulatory Visit (INDEPENDENT_AMBULATORY_CARE_PROVIDER_SITE_OTHER): Payer: 59 | Admitting: Family Medicine

## 2022-07-25 VITALS — BP 118/80 | HR 76 | Temp 98.0°F | Wt 242.2 lb

## 2022-07-25 DIAGNOSIS — Z1211 Encounter for screening for malignant neoplasm of colon: Secondary | ICD-10-CM

## 2022-07-25 DIAGNOSIS — R41840 Attention and concentration deficit: Secondary | ICD-10-CM

## 2022-07-25 DIAGNOSIS — Z87891 Personal history of nicotine dependence: Secondary | ICD-10-CM

## 2022-07-25 DIAGNOSIS — E663 Overweight: Secondary | ICD-10-CM

## 2022-07-25 DIAGNOSIS — R0609 Other forms of dyspnea: Secondary | ICD-10-CM | POA: Insufficient documentation

## 2022-07-25 DIAGNOSIS — M5412 Radiculopathy, cervical region: Secondary | ICD-10-CM

## 2022-07-25 DIAGNOSIS — F332 Major depressive disorder, recurrent severe without psychotic features: Secondary | ICD-10-CM

## 2022-07-25 DIAGNOSIS — R5383 Other fatigue: Secondary | ICD-10-CM

## 2022-07-25 DIAGNOSIS — K219 Gastro-esophageal reflux disease without esophagitis: Secondary | ICD-10-CM

## 2022-07-25 DIAGNOSIS — Z1212 Encounter for screening for malignant neoplasm of rectum: Secondary | ICD-10-CM

## 2022-07-25 DIAGNOSIS — F4310 Post-traumatic stress disorder, unspecified: Secondary | ICD-10-CM

## 2022-07-25 LAB — HEMOGLOBIN A1C
Average Estimated Glucose: 93.9 mg/dL
Hemoglobin A1C: 4.9 % (ref 4.6–5.6)

## 2022-07-25 LAB — T4, FREE: T4 Free: 0.86 ng/dL (ref 0.69–1.48)

## 2022-07-25 LAB — CBC AND DIFFERENTIAL
Absolute NRBC: 0 10*3/uL (ref 0.00–0.00)
Basophils Absolute Automated: 0.02 10*3/uL (ref 0.00–0.08)
Basophils Automated: 0.3 %
Eosinophils Absolute Automated: 0.22 10*3/uL (ref 0.00–0.44)
Eosinophils Automated: 2.9 %
Hematocrit: 46 % (ref 37.6–49.6)
Hgb: 15.5 g/dL (ref 12.5–17.1)
Immature Granulocytes Absolute: 0.02 10*3/uL (ref 0.00–0.07)
Immature Granulocytes: 0.3 %
Instrument Absolute Neutrophil Count: 4.37 10*3/uL (ref 1.10–6.33)
Lymphocytes Absolute Automated: 2.14 10*3/uL (ref 0.42–3.22)
Lymphocytes Automated: 28.5 %
MCH: 32.1 pg (ref 25.1–33.5)
MCHC: 33.7 g/dL (ref 31.5–35.8)
MCV: 95.2 fL (ref 78.0–96.0)
MPV: 10.7 fL (ref 8.9–12.5)
Monocytes Absolute Automated: 0.73 10*3/uL (ref 0.21–0.85)
Monocytes: 9.7 %
Neutrophils Absolute: 4.37 10*3/uL (ref 1.10–6.33)
Neutrophils: 58.3 %
Nucleated RBC: 0 /100 WBC (ref 0.0–0.0)
Platelets: 235 10*3/uL (ref 142–346)
RBC: 4.83 10*6/uL (ref 4.20–5.90)
RDW: 13 % (ref 11–15)
WBC: 7.5 10*3/uL (ref 3.10–9.50)

## 2022-07-25 LAB — COMPREHENSIVE METABOLIC PANEL
ALT: 31 U/L (ref 0–55)
AST (SGOT): 30 U/L (ref 5–41)
Albumin/Globulin Ratio: 1.2 (ref 0.9–2.2)
Albumin: 4 g/dL (ref 3.5–5.0)
Alkaline Phosphatase: 56 U/L (ref 37–117)
Anion Gap: 4 — ABNORMAL LOW (ref 5.0–15.0)
BUN: 17 mg/dL (ref 9.0–28.0)
Bilirubin, Total: 0.3 mg/dL (ref 0.2–1.2)
CO2: 31 mEq/L — ABNORMAL HIGH (ref 17–29)
Calcium: 9.5 mg/dL (ref 8.5–10.5)
Chloride: 103 mEq/L (ref 99–111)
Creatinine: 0.9 mg/dL (ref 0.5–1.5)
Globulin: 3.3 g/dL (ref 2.0–3.6)
Glucose: 97 mg/dL (ref 70–100)
Potassium: 5.2 mEq/L (ref 3.5–5.3)
Protein, Total: 7.3 g/dL (ref 6.0–8.3)
Sodium: 138 mEq/L (ref 135–145)
eGFR: >60 mL/min/{1.73_m2} (ref >=60)

## 2022-07-25 LAB — VITAMIN B12: Vitamin B-12: 1037 pg/mL — ABNORMAL HIGH (ref 211–911)

## 2022-07-25 LAB — TSH: TSH: 0.97 u[IU]/mL (ref 0.35–4.94)

## 2022-07-25 LAB — HEMOLYSIS INDEX: Hemolysis Index: 7 Index (ref 0–24)

## 2022-07-25 LAB — T3, FREE: T3, Free: 2.76 pg/mL (ref 1.71–3.71)

## 2022-07-25 MED ORDER — ALPRAZOLAM 0.5 MG PO TABS
0.5000 mg | ORAL_TABLET | Freq: Two times a day (BID) | ORAL | 0 refills | Status: DC | PRN
Start: 2022-07-25 — End: 2022-08-26

## 2022-07-25 NOTE — Progress Notes (Signed)
Have you seen any specialists/other providers since your last visit with [place]?    No    Do you agree to telemedicine visit?  No    Arm preference verified?   Yes, no preference    Are you going to be on the state of [place] during this telemedicine appointment?  yes    Health Maintenance Due   Topic Date Due    Statin Use  Never done    Advance Directive on File  Never done    Shingrix Vaccine 50+ (1) Never done    HEPATITIS C SCREENING  Never done    HIGH RISK PNEUMONIA  Never done    COVID-19 Vaccine (3 - Pfizer series) 07/17/2020    Annual Exam  04/30/2022

## 2022-07-25 NOTE — Assessment & Plan Note (Addendum)
Hemodynamically stable with physical examination unremarkable.  Differentials at this time include anxiety/panic attack versus possible underlying COPD given long history of being a former smoker as well as underlying cardiac pathology.  Referral provided for pulmonologist to consider pulmonary function testing to rule out reactive airway disease.  Referral also provided for cardiologist to consider baseline echocardiogram or any other testing as deemed appropriate.  We will follow-up on labs.  Advised to take Cymbalta on daily basis will help with underlying mood disorder as well as  neck related symptoms.   Detailed discussion done about red flag signs and to seek medical attention right away, patient verbalized understanding.  Patient agreeable with plan, all questions answered.

## 2022-07-25 NOTE — Progress Notes (Signed)
[facility]                       Date of Exam: 07/25/2022 12:23 PM        Patient ID: Bill Kaiser is a 55 y.o. male.  Attending Physician: Gillermo Murdoch, MD        Chief Complaint:    Chief Complaint   Patient presents with    Depression     Needs a refill for Xanax    OTHER     -Needs blood panel  -Hard time breathing when walking, DENIES chest pain         Assessment and Plan:    Problem List Items Addressed This Visit          Digestive    Gastroesophageal reflux disease without esophagitis - Primary (Chronic)    Overview     EGD 02/05/2019 with Dr. Roselyn Bering; on PPI and Pepcid         Current Assessment & Plan     Consult notes reviewed.            Nervous and Auditory    Cervical radiculopathy (Chronic)    Overview     CT cervical spine 04/2020  Xray cervical spine 07/11/2021  Multilevel degenerative disc changes most significant at C4-5, C5-6 and C6-7. Mild anterior grade 1 spondylolisthesis of C4 and C5 which increases from 2 to 3 mm with flexion. No acute fracture  MRI Cervical spine 07/11/2021 Moderate to marked multilevel degenerative disc disease and spondylosis resulting in multilevel foraminal narrowing from C3 -C4 through C6-C7 with superimposed central canal narrowing at C5-C6. No cord compression or cord signal change  Neurosurgery Dr. Irving Burton  Pain management ([facility])         Current Assessment & Plan     Patient scheduled next month for second epidural injection, advised to continue with Cymbalta on daily basis that will help with possible neuropathy related pain as well.  Consult notes reviewed.         Relevant Medications    DULoxetine (CYMBALTA) 30 MG capsule    ALPRAZolam (Xanax) 0.5 MG tablet       Other    Recurrent major depressive disorder (Chronic)    Overview     Has tried Prozac in the past  Took Zoloft for a month with side effects  On Xanax twice daily as needed          Current Assessment & Plan     We will continue with Xanax as needed  for now but advised patient take Cymbalta that he already has been prescribed by pain management that will help with underlying mood disorder.  Medication side effects and safety profile discussed.  Detailed discussion done about red flag signs including SI/HI counseling and supportive care provided.         Relevant Medications    DULoxetine (CYMBALTA) 30 MG capsule    ALPRAZolam (Xanax) 0.5 MG tablet    PTSD (post-traumatic stress disorder) (Chronic)    Overview     Xanax twice daily as needed         Current Assessment & Plan     Medication refill done, PMP reviewed.  Medication side effects and safety profile discussed.         Relevant Medications    DULoxetine (CYMBALTA) 30 MG capsule    ALPRAZolam (Xanax) 0.5 MG tablet    Former smoker  Overview     40 pack years, quit 2019; occasional vaping         Current Assessment & Plan     LDCT Lung Cancer Screening:   July 26, 2022   Bill Kaiser  Sep 23, 1966  Age: 56 y.o.     Social History     Tobacco Use   Smoking Status Former    Types: Cigars   Smokeless Tobacco Never   Tobacco Comments    Occasional vaping      Imaging:  []  LDCT Lung Screening (baseline)  [x]  LDCT Annual Lung Screening Exam (already had baseline)    Shared Decision Making:  [x]  Reviewed the USPSTF recommendation for annual screening for lung cancer with LDCT for adults aged 16 to 80 years who have a 20 pack-year smoking history and currently smoke or have quit within the past 15 years.  [x]  Pt is asymptomatic ( no fever, chest pain, new dyspnea, new or changing cough, hemoptysis, or unexplained significant weight loss)  [x]  Pt participated in a shared decision making discussion during which the potential risks and benefits of CT lung cancer screening were discussed, including over-diagnosis, false positive rates, radiation exposure, and follow-up diagnostic testing.  [x]  Pt was informed of the importance of adherence to annual screening, impact of comorbidities, and ability/willingness to  undergo diagnosis and treatment.  [x]  Pt was informed of the importance of smoking cessation and/or maintaining smoking abstinence, including the offer of Medicare-covered tobacco cessation counseling services, if applicable.         Gillermo Murdoch, MD              Relevant Orders    CT SCREENING LUNG LOW DOSE    Ambulatory referral to Pulmonology    Screening for colorectal cancer    Overview     Colonoscopy 04/2018 known to have polyps; repeat done by Dr. Roselyn Bering 02/05/2022         Current Assessment & Plan     Consult notes reviewed.         Overweight (BMI 25.0-29.9)    Current Assessment & Plan     Advised lifestyle changes.         DOE (dyspnea on exertion)    Current Assessment & Plan     Hemodynamically stable with physical examination unremarkable.  Differentials at this time include anxiety/panic attack versus possible underlying COPD given long history of being a former smoker as well as underlying cardiac pathology.  Referral provided for pulmonologist to consider pulmonary function testing to rule out reactive airway disease.  Referral also provided for cardiologist to consider baseline echocardiogram or any other testing as deemed appropriate.  We will follow-up on labs.  Advised to take Cymbalta on daily basis will help with underlying mood disorder as well as  neck related symptoms.   Detailed discussion done about red flag signs and to seek medical attention right away, patient verbalized understanding.  Patient agreeable with plan, all questions answered.         Relevant Orders    Ambulatory referral to Pulmonology    Comprehensive metabolic panel (Completed)    CBC and differential (Completed)    Hemoglobin A1C (Completed)    Referral to Cardiology: [facility] (INTERNAL)    Other fatigue    Current Assessment & Plan     We will start with basic labs to rule out any possible underlying electrolyte/metabolic abnormality.  Other possibility could be sleep apnea, patient defers sleep study for  now?         Relevant Orders    T3, free (Completed)    T4, free (Completed)    Comprehensive metabolic panel (Completed)    CBC and differential (Completed)    Hemoglobin A1C (Completed)    Vitamin B12 (Completed)    TSH (Completed)    Referral to Cardiology: [facility] (INTERNAL)              Problem List:    Patient Active Problem List   Diagnosis    Recurrent major depressive disorder    PTSD (post-traumatic stress disorder)    Gastroesophageal reflux disease without esophagitis    Osteoarthritis of lumbar spine    Mass of subcutaneous tissue of back    Pigmented skin lesions    Cervical radiculopathy    Former smoker    Screening for colorectal cancer    H/O asplenia    Overweight (BMI 25.0-29.9)    Attention and concentration deficit    Need for vaccination    DOE (dyspnea on exertion)    Other fatigue             Current Meds:    Outpatient Medications Marked as Taking for the 07/25/22 encounter (Office Visit) with Gillermo Murdoch, MD   Medication Sig Dispense Refill    Ascorbic Acid (VITAMIN C) 1000 MG tablet Take 1 tablet (1,000 mg) by mouth daily 2-3 tablets      b complex vitamins capsule Take 1 capsule by mouth every other day      Cholecalciferol (VITAMIN D3) 3000 units Tab Take by mouth          DULoxetine (CYMBALTA) 30 MG capsule Take 1 capsule (30 mg) by mouth daily      famotidine (PEPCID) 40 MG tablet Take 0.5 tablets (20 mg total) by mouth 2 (two) times daily (Patient taking differently: Take 0.5 tablets (20 mg) by mouth daily) 60 tablet 5    pantoprazole (PROTONIX) 40 MG tablet Take 1 tablet (40 mg) by mouth daily (Patient taking differently: Take 20 mg by mouth daily) 30 tablet 11    vitamin A 16109 UNIT capsule Take 1 capsule (10,000 Units) by mouth      VITAMIN K PO Take by mouth          [DISCONTINUED] ALPRAZolam (Xanax) 0.5 MG tablet Take 1 tablet (0.5 mg) by mouth 2 (two) times daily as needed for Anxiety 60 tablet 0          Allergies:    Allergies   Allergen Reactions    Penicillins               Past Surgical History:    Past Surgical History:   Procedure Laterality Date    EGD, BIOPSY N/A 08/11/2018    Procedure: EGD, BIOPSY;  Surgeon: Virgina Norfolk, MD;  Location: [facility];  Service: Gastroenterology;  Laterality: N/A;    EGD, COLONOSCOPY N/A 05/13/2018    Procedure: EGD, COLONOSCOPY WITH BIOPSY;  Surgeon: Virgina Norfolk, MD;  Location: [facility];  Service: Gastroenterology;  Laterality: N/A;    EGD, COLONOSCOPY N/A 02/05/2022    Procedure: EGD, COLONOSCOPY;  Surgeon: Leilani Merl, MD;  Location: [facility];  Service: Gastroenterology;  Laterality: N/A;    HERNIA REPAIR      neck injection      cortisone    SPLENECTOMY, TOTAL      TONSILLECTOMY  Family History:    Family History   Problem Relation Age of Onset    Dementia Mother     No known problems Father     No known problems Brother     Malignant hyperthermia Neg Hx     Pseudochol deficiency Neg Hx            Social History:    Social History     Tobacco Use    Smoking status: Former     Types: Cigars    Smokeless tobacco: Never    Tobacco comments:     Occasional vaping   Vaping Use    Vaping Use: Some days   Substance Use Topics    Alcohol use: Not Currently    Drug use: Not Currently           The following sections were reviewed this encounter by the provider:   Tobacco  Allergies  Meds  Problems  Med Hx  Surg Hx  Fam Hx             HPI:    Patient is here for follow-up.  Patient reports his neck pain has been getting worse.  He did have epidural injection done in June and has appointment scheduled next month for repeat.  Patient reports he did injections years ago and the initial one helped a little bit but symptoms have come back.  He continues to take Cymbalta off-and-on.  He did have EGD and colonoscopy done 02/05/2022.  He is also been experiencing shortness of breath mostly with exertion but denies any chest pain otherwise.  He has not seen a cardiologist recently or pulmonologist.  He occasionally vapes but  has quit smoking for more than 3 years now.  He has been under a lot of stress because of his mother's death as well as financial situation.  He reports his son wants to be a Occupational hygienist.  He continues to have for years off productive of whitish phlegm but has not changed.  Denies any fever or chills.  Requesting an order for low-dose lung CT.                     Vital Signs:    BP 118/80 (BP Site: Left arm, Patient Position: Sitting, Cuff Size: Large)   Pulse 76   Temp 98 F (36.7 C)   Wt 109.9 kg (242 lb 3.2 oz)   SpO2 97%   BMI 29.48 kg/m          ROS:    Review of Systems   Constitutional:  Positive for fatigue. Negative for appetite change, chills, diaphoresis and fever.   HENT:  Negative for congestion, ear pain, mouth sores, postnasal drip, sinus pressure and trouble swallowing.    Eyes:  Negative for photophobia, pain and visual disturbance.   Respiratory:  Positive for cough (off and on with white phlegm; chronic) and shortness of breath (on exertion). Negative for chest tightness, wheezing and stridor.    Cardiovascular:  Negative for chest pain, palpitations and leg swelling.   Gastrointestinal:  Negative for abdominal distention, abdominal pain, blood in stool, constipation, diarrhea, nausea and vomiting.   Endocrine: Negative for polydipsia, polyphagia and polyuria.   Genitourinary:  Negative for decreased urine volume, difficulty urinating, dysuria, flank pain, frequency and urgency.   Musculoskeletal:  Positive for arthralgias, back pain, myalgias and neck pain. Negative for gait problem and joint swelling.   Skin:  Negative for color change and rash.  Allergic/Immunologic: Negative for environmental allergies and food allergies.   Neurological:  Positive for numbness. Negative for dizziness, tremors, syncope, weakness, light-headedness and headaches.   Hematological:  Negative for adenopathy. Does not bruise/bleed easily.   Psychiatric/Behavioral:  Positive for dysphoric mood and sleep disturbance.  Negative for confusion, decreased concentration and hallucinations. The patient is nervous/anxious.               Physical Exam:    Physical Exam  Constitutional:       General: He is awake.      Appearance: Normal appearance. He is well-developed and overweight.   HENT:      Head: Normocephalic and atraumatic.      Right Ear: External ear normal.      Left Ear: External ear normal.      Nose: No nasal tenderness, mucosal edema, congestion or rhinorrhea.      Right Turbinates: Not enlarged or swollen.      Left Turbinates: Not enlarged or swollen.      Right Sinus: No maxillary sinus tenderness or frontal sinus tenderness.      Left Sinus: No maxillary sinus tenderness or frontal sinus tenderness.      Mouth/Throat:      Lips: Pink.      Mouth: Mucous membranes are moist. No oral lesions.      Dentition: Normal dentition.      Tongue: No lesions.      Palate: No mass.      Pharynx: Oropharynx is clear. Uvula midline. No oropharyngeal exudate or posterior oropharyngeal erythema.      Comments: Unable to fully visualize the posterior pharynx.  Eyes:      General: Lids are normal.         Right eye: No discharge.         Left eye: No discharge.      Conjunctiva/sclera: Conjunctivae normal.   Neck:      Thyroid: No thyroid mass or thyroid tenderness.      Vascular: No carotid bruit.      Trachea: Trachea normal.   Cardiovascular:      Rate and Rhythm: Normal rate and regular rhythm.      Pulses:           Radial pulses are 2+ on the right side and 2+ on the left side.      Heart sounds: Normal heart sounds, S1 normal and S2 normal. No murmur heard.  Pulmonary:      Effort: Pulmonary effort is normal.      Breath sounds: Normal breath sounds and air entry. No decreased breath sounds, wheezing, rhonchi or rales.   Abdominal:      General: Abdomen is protuberant. Bowel sounds are normal.      Palpations: Abdomen is soft. There is no mass.      Tenderness: There is no abdominal tenderness. There is no right CVA tenderness  or left CVA tenderness.   Musculoskeletal:      Cervical back: Spasms present. No rigidity, tenderness or bony tenderness. Muscular tenderness present. No spinous process tenderness. Normal range of motion.      Thoracic back: Normal. No spasms, tenderness or bony tenderness.      Lumbar back: Normal. No spasms, tenderness or bony tenderness.      Right lower leg: No edema.      Left lower leg: No edema.   Lymphadenopathy:      Cervical: No cervical adenopathy.   Skin:  Comments: Multiple pigmented skin lesion noted   Neurological:      Mental Status: He is alert.      Cranial Nerves: No dysarthria or facial asymmetry.      Motor: No tremor.   Psychiatric:         Attention and Perception: Attention and perception normal.         Mood and Affect: Mood and affect normal.         Speech: Speech normal.         Behavior: Behavior normal. Behavior is cooperative.         Thought Content: Thought content normal. Thought content does not include homicidal or suicidal ideation.      Comments: PHQ score 5             Little interest or pleasure in doing things: 2 (07/25/2022 11:33 AM)  Feeling down, depressed, or hopeless: 1 (07/25/2022 11:33 AM)  Trouble falling or staying asleep, or sleeping too much: 1 (sleeping more) (07/25/2022 11:33 AM)  Feeling tired or having little energy: 1 (07/25/2022 11:33 AM)  Poor appetite or overeating: 0 (07/25/2022 11:33 AM)  Feeling bad about yourself - or that you are a failure or have let yourself or your family down: 0 (07/25/2022 11:33 AM)  Trouble concentrating on things, such as reading the newspaper or watching television: 0 (07/25/2022 11:33 AM)  Moving or speaking so slowly that other people could have noticed. Or the opposite - being so fidgety or restless that you have been moving around a lot more than usual: 0 (07/25/2022 11:33 AM)  Thoughts that you would be better off dead, or of hurting yourself in some way: 0 (07/25/2022 11:33 AM)  PHQ Total Score: 5 (07/25/2022 11:33 AM)  If you  checked off any problems, how difficult have these problems made it for you to do your work, take care of things at home, or get along with other people?: Somewhat difficult (07/25/2022 11:33 AM)       Follow-up:    Return in about 3 months (around 10/25/2022) for annual exam.         Gillermo Murdoch, MD       The following activities were performed on the date of service:  preparing to see the patient: - chart review   - review of prior labs  - review of prior imaging tests  - review of prior pathology reports  - review of consultant reports  performing a medically appropriate examination and/or evaluation   counseling and educating the patient/family/caregiver  ordering medications, tests, or procedures  documenting clinical information in the electronic or other health records  [place]    Total time spent performing activities on date of service:  42  minutes            This note was generated using voice recognition software which may contain typographical, grammatical and word choice errors.

## 2022-07-26 DIAGNOSIS — R5383 Other fatigue: Secondary | ICD-10-CM | POA: Insufficient documentation

## 2022-07-26 NOTE — Assessment & Plan Note (Signed)
We will continue with Xanax as needed for now but advised patient take Cymbalta that he already has been prescribed by pain management that will help with underlying mood disorder.  Medication side effects and safety profile discussed.  Detailed discussion done about red flag signs including SI/HI counseling and supportive care provided.

## 2022-07-26 NOTE — Assessment & Plan Note (Signed)
Medication refill done, PMP reviewed.  Medication side effects and safety profile discussed.

## 2022-07-26 NOTE — Assessment & Plan Note (Signed)
We will start with basic labs to rule out any possible underlying electrolyte/metabolic abnormality.  Other possibility could be sleep apnea, patient defers sleep study for now?

## 2022-07-26 NOTE — Assessment & Plan Note (Signed)
Patient scheduled next month for second epidural injection, advised to continue with Cymbalta on daily basis that will help with possible neuropathy related pain as well.  Consult notes reviewed.

## 2022-07-26 NOTE — Assessment & Plan Note (Signed)
Consult notes reviewed.

## 2022-07-26 NOTE — Assessment & Plan Note (Signed)
LDCT Lung Cancer Screening:   July 26, 2022   Bill Kaiser  1966-04-22  Age: 56 y.o.     Social History     Tobacco Use   Smoking Status Former   . Types: Cigars   Smokeless Tobacco Never   Tobacco Comments    Occasional vaping      Imaging:  []  LDCT Lung Screening (baseline)  [x]  LDCT Annual Lung Screening Exam (already had baseline)    Shared Decision Making:  [x]  Reviewed the USPSTF recommendation for annual screening for lung cancer with LDCT for adults aged 38 to 80 years who have a 20 pack-year smoking history and currently smoke or have quit within the past 15 years.  [x]  Pt is asymptomatic ( no fever, chest pain, new dyspnea, new or changing cough, hemoptysis, or unexplained significant weight loss)  [x]  Pt participated in a shared decision making discussion during which the potential risks and benefits of CT lung cancer screening were discussed, including over-diagnosis, false positive rates, radiation exposure, and follow-up diagnostic testing.  [x]  Pt was informed of the importance of adherence to annual screening, impact of comorbidities, and ability/willingness to undergo diagnosis and treatment.  [x]  Pt was informed of the importance of smoking cessation and/or maintaining smoking abstinence, including the offer of Medicare-covered tobacco cessation counseling services, if applicable.         , MD

## 2022-07-26 NOTE — Assessment & Plan Note (Signed)
Advised lifestyle changes.

## 2022-08-09 ENCOUNTER — Emergency Department: Payer: 59

## 2022-08-09 ENCOUNTER — Emergency Department
Admission: EM | Admit: 2022-08-09 | Discharge: 2022-08-09 | Disposition: A | Discharge status: Home / Self Care | Payer: 59 | Attending: Emergency Medicine | Admitting: Emergency Medicine

## 2022-08-09 DIAGNOSIS — I2699 Other pulmonary embolism without acute cor pulmonale: Secondary | ICD-10-CM | POA: Insufficient documentation

## 2022-08-09 DIAGNOSIS — R079 Chest pain, unspecified: Secondary | ICD-10-CM

## 2022-08-09 LAB — BASIC METABOLIC PANEL
Anion Gap: 9 (ref 5.0–15.0)
BUN: 14 mg/dL (ref 9.0–28.0)
CO2: 28 mEq/L (ref 17–29)
Calcium: 9.1 mg/dL (ref 8.5–10.5)
Chloride: 97 mEq/L — ABNORMAL LOW (ref 99–111)
Creatinine: 0.9 mg/dL (ref 0.5–1.5)
Glucose: 105 mg/dL — ABNORMAL HIGH (ref 70–100)
Potassium: 4.1 mEq/L (ref 3.5–5.3)
Sodium: 134 mEq/L — ABNORMAL LOW (ref 135–145)
eGFR: >60 mL/min/{1.73_m2} (ref >=60)

## 2022-08-09 LAB — CBC AND DIFFERENTIAL
Absolute NRBC: 0 10*3/uL (ref 0.00–0.00)
Basophils Absolute Automated: 0.02 10*3/uL (ref 0.00–0.08)
Basophils Automated: 0.2 %
Eosinophils Absolute Automated: 0.37 10*3/uL (ref 0.00–0.44)
Eosinophils Automated: 3.7 %
Hematocrit: 46.3 % (ref 37.6–49.6)
Hgb: 15.3 g/dL (ref 12.5–17.1)
Immature Granulocytes Absolute: 0.03 10*3/uL (ref 0.00–0.07)
Immature Granulocytes: 0.3 %
Instrument Absolute Neutrophil Count: 6.82 10*3/uL — ABNORMAL HIGH (ref 1.10–6.33)
Lymphocytes Absolute Automated: 2.16 10*3/uL (ref 0.42–3.22)
Lymphocytes Automated: 21.5 %
MCH: 30.8 pg (ref 25.1–33.5)
MCHC: 33 g/dL (ref 31.5–35.8)
MCV: 93.2 fL (ref 78.0–96.0)
MPV: 9.8 fL (ref 8.9–12.5)
Monocytes Absolute Automated: 0.63 10*3/uL (ref 0.21–0.85)
Monocytes: 6.3 %
Neutrophils Absolute: 6.82 10*3/uL — ABNORMAL HIGH (ref 1.10–6.33)
Neutrophils: 68 %
Nucleated RBC: 0 /100 WBC (ref 0.0–0.0)
Platelets: 180 10*3/uL (ref 142–346)
RBC: 4.97 10*6/uL (ref 4.20–5.90)
RDW: 12 % (ref 11–15)
WBC: 10.03 10*3/uL — ABNORMAL HIGH (ref 3.10–9.50)

## 2022-08-09 LAB — ECG 12-LEAD
P Axis: 57 degrees
Q-T Interval: 362 ms
T Axis: 49 degrees

## 2022-08-09 LAB — HIGH SENSITIVITY TROPONIN-I: hs Troponin-I: <2.7 ng/L

## 2022-08-09 LAB — IHS D-DIMER: D-Dimer: 5.89 ug/mL FEU — ABNORMAL HIGH (ref 0.00–0.60)

## 2022-08-09 LAB — PROBNP: NT-proBNP: 26 pg/mL (ref 0–125)

## 2022-08-09 MED ORDER — IOHEXOL 350 MG/ML IV SOLN
100.0000 mL | Freq: Once | INTRAVENOUS | Status: AC | PRN
Start: 2022-08-09 — End: 2022-08-09
  Administered 2022-08-09: 100 mL via INTRAVENOUS

## 2022-08-09 MED ORDER — ELIQUIS DVT/PE STARTER PACK 5 MG PO TBPK
ORAL_TABLET | ORAL | 0 refills | Status: DC
Start: 2022-08-09 — End: 2022-08-26
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
## 2022-08-11 LAB — ECG 12-LEAD
Atrial Rate: 92 {beats}/min
IHS MUSE NARRATIVE AND IMPRESSION: NORMAL
P-R Interval: 158 ms
QRS Duration: 74 ms
QTC Calculation (Bezet): 447 ms
R Axis: -41 degrees
Ventricular Rate: 92 {beats}/min

## 2022-08-14 ENCOUNTER — Encounter (INDEPENDENT_AMBULATORY_CARE_PROVIDER_SITE_OTHER): Payer: Self-pay

## 2022-08-20 ENCOUNTER — Ambulatory Visit: Payer: 59 | Admitting: Physician Assistant

## 2022-08-23 ENCOUNTER — Encounter (INDEPENDENT_AMBULATORY_CARE_PROVIDER_SITE_OTHER): Payer: Self-pay | Admitting: Family Medicine

## 2022-08-23 ENCOUNTER — Telehealth (INDEPENDENT_AMBULATORY_CARE_PROVIDER_SITE_OTHER): Payer: Self-pay | Admitting: Family Medicine

## 2022-08-23 ENCOUNTER — Encounter: Payer: Self-pay | Admitting: Physician Assistant

## 2022-08-23 ENCOUNTER — Ambulatory Visit
Admission: RE | Admit: 2022-08-23 | Discharge: 2022-08-23 | Disposition: A | Discharge status: Home / Self Care | Payer: 59 | Source: Ambulatory Visit | Attending: Physician Assistant | Admitting: Physician Assistant

## 2022-08-23 VITALS — BP 132/85 | HR 81 | Temp 99.0°F | Resp 15 | Wt 237.0 lb

## 2022-08-23 DIAGNOSIS — M542 Cervicalgia: Secondary | ICD-10-CM

## 2022-08-23 DIAGNOSIS — M5412 Radiculopathy, cervical region: Secondary | ICD-10-CM | POA: Insufficient documentation

## 2022-08-23 MED ORDER — GABAPENTIN 300 MG PO CAPS
ORAL_CAPSULE | ORAL | 2 refills | Status: DC
Start: 2022-08-23 — End: 2024-07-16
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
## 2022-08-23 NOTE — Telephone Encounter (Signed)
Writer called and left a voicemail to have pt make a video appointment,will need to be in the state of [place] to do VV

## 2022-08-23 NOTE — Telephone Encounter (Signed)
Please call patient and have him schedule hospital follow-up and I can discuss his concerns further.  Virtual appointment is okay as long as he is in state of [place].  Thank you.

## 2022-08-26 ENCOUNTER — Encounter (INDEPENDENT_AMBULATORY_CARE_PROVIDER_SITE_OTHER): Payer: Self-pay | Admitting: Family Medicine

## 2022-08-26 ENCOUNTER — Telehealth (INDEPENDENT_AMBULATORY_CARE_PROVIDER_SITE_OTHER): Payer: 59 | Admitting: Family Medicine

## 2022-08-26 DIAGNOSIS — I824Z9 Acute embolism and thrombosis of unspecified deep veins of unspecified distal lower extremity: Secondary | ICD-10-CM | POA: Insufficient documentation

## 2022-08-26 DIAGNOSIS — K219 Gastro-esophageal reflux disease without esophagitis: Secondary | ICD-10-CM

## 2022-08-26 DIAGNOSIS — Z09 Encounter for follow-up examination after completed treatment for conditions other than malignant neoplasm: Secondary | ICD-10-CM | POA: Insufficient documentation

## 2022-08-26 DIAGNOSIS — I2782 Chronic pulmonary embolism: Secondary | ICD-10-CM | POA: Insufficient documentation

## 2022-08-26 DIAGNOSIS — I2699 Other pulmonary embolism without acute cor pulmonale: Secondary | ICD-10-CM | POA: Insufficient documentation

## 2022-08-26 DIAGNOSIS — M5412 Radiculopathy, cervical region: Secondary | ICD-10-CM

## 2022-08-26 DIAGNOSIS — F332 Major depressive disorder, recurrent severe without psychotic features: Secondary | ICD-10-CM

## 2022-08-26 MED ORDER — APIXABAN 5 MG PO TABS
5.0000 mg | ORAL_TABLET | Freq: Two times a day (BID) | ORAL | 0 refills | Status: DC
Start: 2022-09-09 — End: 2022-12-23

## 2022-08-26 MED ORDER — ALPRAZOLAM 0.5 MG PO TABS
0.5000 mg | ORAL_TABLET | Freq: Two times a day (BID) | ORAL | 0 refills | Status: DC | PRN
Start: 2022-08-26 — End: 2022-12-09

## 2022-08-26 NOTE — Assessment & Plan Note (Signed)
Recent hospital records including consult notes, labs and imaging reviewed and discussed with patient in detail at beside. Medication reviewed and reconciled.

## 2022-08-26 NOTE — Assessment & Plan Note (Signed)
Remains on Eliquis, medication refill done.  Discussed about remaining on blood thinners for at least 6 months or otherwise depending upon hematology evaluation, referral provided.  Fall precautions advised.  Advised to keep appointment with cardiologist to consider additional work-up including but not committed to echocardiogram.  Red flag signs emphasized.

## 2022-08-26 NOTE — Assessment & Plan Note (Signed)
Doing well on pantoprazole and Pepcid, advised lifestyle changes.

## 2022-08-26 NOTE — Assessment & Plan Note (Signed)
Patient is score 8 and gad-7 score 8 with no reported SI/HI.  Patient experienced worsening depression with Cymbalta and stopped after 1 week and would like to continue with Xanax for now.  Medication refill done, PMP reviewed.  We discussed about possible concerns for side effects including addictive potential, needing high doses for similar effect as well as concerns for CNS depression.  Since patient has had side effects with multiple SSRI/SNRI referral provided for psychiatrist to help with medication management.  Detailed discussion done about red flag signs including SI/HI counseling and supportive care provided.

## 2022-08-26 NOTE — Progress Notes (Signed)
[facility]                       Date of Exam: 08/26/2022 8:09 AM        Patient ID: Bill Kaiser is a 56 y.o. male.  Attending Physician: Gillermo Murdoch, MD        Chief Complaint:    Chief Complaint   Patient presents with    PE and DVT diagnosed right calf         Assessment and Plan:    Problem List Items Addressed This Visit          Cardiovascular and Mediastinum    Acute deep vein thrombosis (DVT) of distal vein of lower extremity, unspecified laterality    Current Assessment & Plan     Remains on Eliquis, medication refill done.  Referral provided for hematologist to consider hypercoagulable work-up and to help with duration of blood thinners (lifelong versus possibly  6 months).  Fall precautions advised.         Relevant Medications    apixaban (Eliquis) 5 MG (Start on 09/09/2022)    Other Relevant Orders    Referral to Hematology / Oncology: [facility] (INTERNAL)    Other acute pulmonary embolism without acute cor pulmonale    Current Assessment & Plan     Remains on Eliquis, medication refill done.  Discussed about remaining on blood thinners for at least 6 months or otherwise depending upon hematology evaluation, referral provided.  Fall precautions advised.  Advised to keep appointment with cardiologist to consider additional work-up including but not committed to echocardiogram.  Red flag signs emphasized.           Relevant Medications    apixaban (Eliquis) 5 MG (Start on 09/09/2022)    Other Relevant Orders    Referral to Hematology / Oncology: [facility] (INTERNAL)       Digestive    Gastroesophageal reflux disease without esophagitis (Chronic)    Overview     EGD 02/05/2019 with Dr. Roselyn Bering; on PPI and Pepcid         Current Assessment & Plan     Doing well on pantoprazole and Pepcid, advised lifestyle changes.            Nervous and Auditory    Cervical radiculopathy (Chronic)    Overview     CT cervical spine 04/2020  Xray cervical spine 07/11/2021   Multilevel degenerative disc changes most significant at C4-5, C5-6 and C6-7. Mild anterior grade 1 spondylolisthesis of C4 and C5 which increases from 2 to 3 mm with flexion. No acute fracture  MRI Cervical spine 07/11/2021 Moderate to marked multilevel degenerative disc disease and spondylosis resulting in multilevel foraminal narrowing from C3 -C4 through C6-C7 with superimposed central canal narrowing at C5-C6. No cord compression or cord signal change  Neurosurgery Dr. Irving Burton  Pain management ([facility])         Current Assessment & Plan     Consult notes reviewed, patient will be following up for trigger point injection.  Has prescription for gabapentin that he has not started yet.          Relevant Medications    ALPRAZolam (Xanax) 0.5 MG tablet       Other    Recurrent major depressive disorder (Chronic)    Overview     Has tried Prozac in the past  Took Zoloft for a month with side  effects  cymbalta made depression worse  lexapro not effective; unable to recall any S/E  On Xanax twice daily as needed          Current Assessment & Plan     Patient is score 8 and gad-7 score 8 with no reported SI/HI.  Patient experienced worsening depression with Cymbalta and stopped after 1 week and would like to continue with Xanax for now.  Medication refill done, PMP reviewed.  We discussed about possible concerns for side effects including addictive potential, needing high doses for similar effect as well as concerns for CNS depression.  Since patient has had side effects with multiple SSRI/SNRI referral provided for psychiatrist to help with medication management.  Detailed discussion done about red flag signs including SI/HI counseling and supportive care provided.         Relevant Medications    ALPRAZolam (Xanax) 0.5 MG tablet    Other Relevant Orders    Ambulatory referral to Psychiatry    Hospital discharge follow-up - Primary    Current Assessment & Plan     Recent hospital records including consult  notes, labs and imaging reviewed and discussed with patient in detail at beside. Medication reviewed and reconciled.                     Problem List:    Patient Active Problem List   Diagnosis    Recurrent major depressive disorder    PTSD (post-traumatic stress disorder)    Gastroesophageal reflux disease without esophagitis    Osteoarthritis of lumbar spine    Mass of subcutaneous tissue of back    Pigmented skin lesions    Cervical radiculopathy    Former smoker    Screening for colorectal cancer    H/O asplenia    Overweight (BMI 25.0-29.9)    Attention and concentration deficit    Need for vaccination    DOE (dyspnea on exertion)    Other fatigue    Acute deep vein thrombosis (DVT) of distal vein of lower extremity, unspecified laterality    Other acute pulmonary embolism without acute cor pulmonale    Hospital discharge follow-up             Current Meds:    Outpatient Medications Marked as Taking for the 08/26/22 encounter (Telemedicine Visit) with Gillermo Murdoch, MD   Medication Sig Dispense Refill    Ascorbic Acid (VITAMIN C) 1000 MG tablet Take 1 tablet (1,000 mg) by mouth daily 2-3 tablets      b complex vitamins capsule Take 1 capsule by mouth every other day      Cholecalciferol (VITAMIN D3) 3000 units Tab Take by mouth          famotidine (PEPCID) 40 MG tablet Take 0.5 tablets (20 mg total) by mouth 2 (two) times daily (Patient taking differently: Take 0.5 tablets (20 mg) by mouth daily) 60 tablet 5    pantoprazole (PROTONIX) 40 MG tablet Take 1 tablet (40 mg) by mouth daily (Patient taking differently: Take 20 mg by mouth daily) 30 tablet 11    vitamin A 54098 UNIT capsule Take 1 capsule (10,000 Units) by mouth      VITAMIN K PO Take by mouth          [DISCONTINUED] ALPRAZolam (Xanax) 0.5 MG tablet Take 1 tablet (0.5 mg) by mouth 2 (two) times daily as needed for Anxiety 90 tablet 0    [DISCONTINUED] Apixaban Starter Pack (Eliquis DVT/PE Starter Pack) 5  MG Tablet Therapy Pack Take 10 mg by mouth every  12 (twelve) hours for 7 days, THEN 5 mg every 12 (twelve) hours for 23 days. 74 tablet 0          Allergies:    Allergies   Allergen Reactions    Penicillins              Past Surgical History:    Past Surgical History:   Procedure Laterality Date    EGD, BIOPSY N/A 08/11/2018    Procedure: EGD, BIOPSY;  Surgeon: Virgina Norfolk, MD;  Location: [facility];  Service: Gastroenterology;  Laterality: N/A;    EGD, COLONOSCOPY N/A 05/13/2018    Procedure: EGD, COLONOSCOPY WITH BIOPSY;  Surgeon: Virgina Norfolk, MD;  Location: [facility];  Service: Gastroenterology;  Laterality: N/A;    EGD, COLONOSCOPY N/A 02/05/2022    Procedure: EGD, COLONOSCOPY;  Surgeon: Leilani Merl, MD;  Location: [facility];  Service: Gastroenterology;  Laterality: N/A;    HERNIA REPAIR      neck injection      cortisone    SPLENECTOMY, TOTAL      TONSILLECTOMY             Family History:    Family History   Problem Relation Age of Onset    Dementia Mother     No known problems Father     No known problems Brother     Malignant hyperthermia Neg Hx     Pseudochol deficiency Neg Hx            Social History:    Social History     Tobacco Use    Smoking status: Former     Types: Cigars    Smokeless tobacco: Never    Tobacco comments:     Occasional vaping   Vaping Use    Vaping Use: Some days   Substance Use Topics    Alcohol use: Not Currently    Drug use: Not Currently           The following sections were reviewed this encounter by the provider:   Tobacco  Allergies  Meds  Problems  Med Hx  Surg Hx  Fam Hx             HPI:    This is a telehealth visit which was conducted with the use of two-way, synchronous, audio and visual interactive telecommunication that permitted real time communication between Mr.  Doane and myself.      he  consented to participation and received services at home understanding the limitations such as detailed physical exam, while we were both located in the state of [place].      This visit was changed from an in-person  visit to a telehealth visit to lower the risk of exposure and / or spread of the current pandemic with the SARS CoV-2 virus. This is based on the guidelines from the [facility] and other health agencies.        Video visit conducted upon patient request for follow-up after being seen in the emergency room August 11.  Patient was noted to have worsening right lower leg swelling.  Had work-up done including D-dimer that was elevated at 5.89.  Advise troponins and BNP were unremarkable.  CTA chest was noted for acute right-sided pulmonary embolism affecting the right main pulmonary artery and descending artery but no strain.  He was started on Eliquis.  He was also seen by neurosurgery office August  25 for neck and shoulder pain.  He was seen last month for concerns for progressive shortness of breath on exertion and was given referral to see cardiology as well as pulmonology to rule out any under cardiac and pulmonary pathology.  Patient reports around the same time he was under a lot of stress as he did have a phone call with stressful conversation and started feeling really bad including sweating, breaking out in a cold sweat and feeling panicky.  Patient reports drinking water and tried to lay down.  Initially he thought that his leg pain was related to strain as he had gone to the gym to workout.  He has been taking Eliquis and started 5 mg twice a day.  Denies any recent falls.  Patient is wondering if he needs to be on any medication to resolve the clot.  He is also wondering if he needs to still see the pulmonologist as well as a cardiologist.  Patient recalls being diagnosed with TIA when he was young in high school and being on blood thinner in the 1990s but does not recall the name.  He reports his father had a lot of blood clots including those in the lungs but that was related to her being wheelchair-bound and she had refused taking blood thinners at that time.  He is also wondering if he still needs to do the  lung CT scan.  He has been going on for light walks.  He will be scheduled for trigger point injection in his neck on the muscles only.  He took Cymbalta for about a week only and it made him feel funny.  When asked more patient reports making him feel more depressed as he is very sensitive to medication just like his mother.  He has been on multiple medications in the past with side effects.  Would like a refill for Xanax.  He would like to go back and see his psychologist for cognitive eval therapy but has been working out a year out.  He is open to seeing a psychiatrist.                   Vital Signs:    There were no vitals taken for this visit.         ROS:    Review of Systems   Constitutional:  Positive for fatigue. Negative for appetite change, chills, diaphoresis and fever.   HENT:  Negative for congestion, ear pain, mouth sores, postnasal drip, sinus pressure and trouble swallowing.    Eyes:  Negative for photophobia, pain and visual disturbance.   Respiratory:  Positive for shortness of breath (on exertion, improving). Negative for cough, chest tightness, wheezing and stridor.    Cardiovascular:  Positive for leg swelling (rigth leg; related to DVT). Negative for chest pain and palpitations.   Gastrointestinal:  Negative for abdominal distention, abdominal pain, blood in stool, constipation, diarrhea, nausea and vomiting.   Endocrine: Negative for polydipsia, polyphagia and polyuria.   Genitourinary:  Negative for decreased urine volume, difficulty urinating, dysuria, flank pain, frequency and urgency.   Musculoskeletal:  Positive for arthralgias, back pain, myalgias and neck pain. Negative for gait problem and joint swelling.   Skin:  Negative for color change and rash.   Allergic/Immunologic: Negative for environmental allergies and food allergies.   Neurological:  Positive for numbness. Negative for dizziness, tremors, syncope, weakness, light-headedness and headaches.   Hematological:  Negative for  adenopathy. Does not bruise/bleed easily.  Psychiatric/Behavioral:  Positive for dysphoric mood and sleep disturbance. Negative for confusion, decreased concentration and hallucinations. The patient is nervous/anxious.               Physical Exam:    Physical Exam  Constitutional:       General: He is awake.      Appearance: Normal appearance. He is well-developed. He is not ill-appearing.   HENT:      Head: Normocephalic and atraumatic.      Mouth/Throat:      Lips: No lesions.   Eyes:      General: No scleral icterus.  Neurological:      Mental Status: He is alert.      Cranial Nerves: No dysarthria or facial asymmetry.   Psychiatric:         Attention and Perception: Attention normal.         Mood and Affect: Mood and affect normal.         Speech: Speech normal.         Behavior: Behavior is cooperative.         Thought Content: Thought content does not include homicidal or suicidal ideation.      Comments: PHQ score 8  GAD-7 score 8.             Little interest or pleasure in doing things: 0 (08/26/2022  7:50 AM)  Feeling down, depressed, or hopeless: 2 (08/26/2022  7:50 AM)  Trouble falling or staying asleep, or sleeping too much: 3 (08/26/2022  7:50 AM)  Feeling tired or having little energy: 2 (08/26/2022  7:50 AM)  Poor appetite or overeating: 0 (08/26/2022  7:50 AM)  Feeling bad about yourself - or that you are a failure or have let yourself or your family down: 1 (08/26/2022  7:50 AM)  Trouble concentrating on things, such as reading the newspaper or watching television: 0 (08/26/2022  7:50 AM)  Moving or speaking so slowly that other people could have noticed. Or the opposite - being so fidgety or restless that you have been moving around a lot more than usual: 0 (08/26/2022  7:50 AM)  Thoughts that you would be better off dead, or of hurting yourself in some way: 0 (08/26/2022  7:50 AM)  PHQ Total Score: 8 (08/26/2022  7:50 AM)  If you checked off any problems, how difficult have these problems made it for you  to do your work, take care of things at home, or get along with other people?: Somewhat difficult (08/26/2022  7:50 AM)       Follow-up:    Return in about 1 month (around 09/26/2022) for annual exam.         Gillermo Murdoch, MD       The following activities were performed on the date of service:  preparing to see the patient: - chart review   - review of prior labs  - review of prior imaging tests  - review of consultant reports  - review of prior ED visits  performing a medically appropriate examination and/or evaluation   counseling and educating the patient/family/caregiver  ordering medications, tests, or procedures  documenting clinical information in the electronic or other health records  [place] review    Total time spent performing activities on date of service:  42  minutes            This note was generated using voice recognition software which may contain typographical, grammatical and word choice errors.

## 2022-08-26 NOTE — Assessment & Plan Note (Signed)
Remains on Eliquis, medication refill done.  Referral provided for hematologist to consider hypercoagulable work-up and to help with duration of blood thinners (lifelong versus possibly  6 months).  Fall precautions advised.

## 2022-08-26 NOTE — Assessment & Plan Note (Signed)
Consult notes reviewed, patient will be following up for trigger point injection.  Has prescription for gabapentin that he has not started yet.

## 2022-08-26 NOTE — Progress Notes (Signed)
Have you seen any specialists since your last visit with [place]?  Yes, ER      The patient was informed that the following HM items are still outstanding:   shingles vaccine, hep c screening, covid vaccine, annual exam flu shot    Pt stated in the state of [place] for the visit and agrees . DOB verified.

## 2022-08-27 ENCOUNTER — Encounter (INDEPENDENT_AMBULATORY_CARE_PROVIDER_SITE_OTHER): Payer: Self-pay | Admitting: Family Medicine

## 2022-08-28 ENCOUNTER — Emergency Department
Admission: EM | Admit: 2022-08-28 | Discharge: 2022-08-28 | Disposition: A | Discharge status: Home / Self Care | Payer: 59 | Attending: Emergency Medicine | Admitting: Emergency Medicine

## 2022-08-28 ENCOUNTER — Encounter (INDEPENDENT_AMBULATORY_CARE_PROVIDER_SITE_OTHER): Payer: Self-pay | Admitting: Family Medicine

## 2022-08-28 ENCOUNTER — Telehealth (INDEPENDENT_AMBULATORY_CARE_PROVIDER_SITE_OTHER): Payer: Self-pay | Admitting: Family Medicine

## 2022-08-28 DIAGNOSIS — K920 Hematemesis: Secondary | ICD-10-CM | POA: Insufficient documentation

## 2022-08-28 DIAGNOSIS — F17290 Nicotine dependence, other tobacco product, uncomplicated: Secondary | ICD-10-CM | POA: Insufficient documentation

## 2022-08-28 LAB — URINALYSIS REFLEX TO MICROSCOPIC EXAM - REFLEX TO CULTURE
Bilirubin, UA: NEGATIVE
Blood, UA: NEGATIVE
Glucose, UA: NEGATIVE
Ketones UA: NEGATIVE
Leukocyte Esterase, UA: NEGATIVE
Nitrite, UA: NEGATIVE
Protein, UR: NEGATIVE
Specific Gravity UA: 1.013 (ref 1.001–1.035)
Urine pH: 6 (ref 5.0–8.0)
Urobilinogen, UA: NORMAL mg/dL

## 2022-08-28 LAB — CBC AND DIFFERENTIAL
Absolute NRBC: 0 10*3/uL (ref 0.00–0.00)
Basophils Absolute Automated: 0.01 10*3/uL (ref 0.00–0.08)
Basophils Automated: 0.1 %
Eosinophils Absolute Automated: 0.26 10*3/uL (ref 0.00–0.44)
Eosinophils Automated: 3.9 %
Hematocrit: 42.6 % (ref 37.6–49.6)
Hgb: 13.8 g/dL (ref 12.5–17.1)
Immature Granulocytes Absolute: 0.02 10*3/uL (ref 0.00–0.07)
Immature Granulocytes: 0.3 %
Instrument Absolute Neutrophil Count: 3.73 10*3/uL (ref 1.10–6.33)
Lymphocytes Absolute Automated: 2.05 10*3/uL (ref 0.42–3.22)
Lymphocytes Automated: 30.4 %
MCH: 31 pg (ref 25.1–33.5)
MCHC: 32.4 g/dL (ref 31.5–35.8)
MCV: 95.7 fL (ref 78.0–96.0)
MPV: 10.5 fL (ref 8.9–12.5)
Monocytes Absolute Automated: 0.67 10*3/uL (ref 0.21–0.85)
Monocytes: 9.9 %
Neutrophils Absolute: 3.73 10*3/uL (ref 1.10–6.33)
Neutrophils: 55.4 %
Nucleated RBC: 0 /100 WBC (ref 0.0–0.0)
Platelets: 301 10*3/uL (ref 142–346)
RBC: 4.45 10*6/uL (ref 4.20–5.90)
RDW: 12 % (ref 11–15)
WBC: 6.74 10*3/uL (ref 3.10–9.50)

## 2022-08-28 LAB — COMPREHENSIVE METABOLIC PANEL
ALT: 20 U/L (ref 0–55)
AST (SGOT): 22 U/L (ref 5–41)
Albumin/Globulin Ratio: 1 (ref 0.9–2.2)
Albumin: 3.9 g/dL (ref 3.5–5.0)
Alkaline Phosphatase: 59 U/L (ref 37–117)
Anion Gap: 8 (ref 5.0–15.0)
BUN: 10 mg/dL (ref 9.0–28.0)
Bilirubin, Total: 0.4 mg/dL (ref 0.2–1.2)
CO2: 23 mEq/L (ref 17–29)
Calcium: 9.3 mg/dL (ref 8.5–10.5)
Chloride: 106 mEq/L (ref 99–111)
Creatinine: 0.9 mg/dL (ref 0.5–1.5)
Globulin: 4 g/dL — ABNORMAL HIGH (ref 2.0–3.6)
Glucose: 98 mg/dL (ref 70–100)
Potassium: 4.6 mEq/L (ref 3.5–5.3)
Protein, Total: 7.9 g/dL (ref 6.0–8.3)
Sodium: 137 mEq/L (ref 135–145)
eGFR: >60 mL/min/{1.73_m2} (ref >=60)

## 2022-08-28 LAB — LIPASE: Lipase: 44 U/L (ref 8–78)

## 2022-08-28 NOTE — Telephone Encounter (Signed)
LVM for pt to let him know Dr. Hoy Morn saw his mychart message and would like for him to go to nearest ER.  Advised if he is unable to go there safely, he is to call 911.

## 2022-08-28 NOTE — Telephone Encounter (Signed)
In reference to patient MyChart message below, I have sent him a message to go to the emergency room right away.  Can you please call and triage him for concerns of abdominal pain and possible GI bleed since Eliquis was recently started for DVT and PE?  Thank you.    Good morning doctor Harutyun Monteverde!     My stomach actually started hurting one week into taking Eliquis I thought it was just usual stomach pain that I have from time to time. But it was very severe and at times made me double over. I also had bleeding in my rectum this morning. And have had to pee almost every night every hour of the night. So I'm not sure being on Eliquis is the right move. Because I was treated for an ulcer. Which I think was healed up. But the blood thinner may have undone that healing. I  recall that I did not eat anything red yet I threw up red fluid. I recall that my stomach was spasming. Please get back to me as soon as possible on how to proceed. Thank you one other question can I just stop taking the eliquis?     Raynelle Dick
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
## 2022-08-28 NOTE — ED Triage Notes (Signed)
Pt with active DVT and active PE, on day 19 of 30 of eliquis. Pt with SOB he relates to PE, not coughing up any blood Pt with GI hx of ulcers, threw up blood yesterday, light pink. Pt states he is also using the bathroom Q1 hour since starting medication. No nausea or dizziness

## 2022-09-09 ENCOUNTER — Encounter: Payer: Self-pay | Admitting: Anesthesiology

## 2022-09-11 ENCOUNTER — Ambulatory Visit (INDEPENDENT_AMBULATORY_CARE_PROVIDER_SITE_OTHER): Payer: Commercial Managed Care - HMO | Admitting: Student in an Organized Health Care Education/Training Program

## 2022-09-11 ENCOUNTER — Ambulatory Visit: Payer: 59 | Admitting: Anesthesiology

## 2022-09-14 ENCOUNTER — Telehealth (INDEPENDENT_AMBULATORY_CARE_PROVIDER_SITE_OTHER): Payer: Self-pay | Admitting: Critical Care Medicine

## 2022-09-14 NOTE — Telephone Encounter (Signed)
Left patient voicemail we have to reschedule appointment. Let patient know I scheduled him in for 9/22 at 2pm at the [place] location, to give [place] a call back confirming appointment and location is okay.

## 2022-09-16 ENCOUNTER — Ambulatory Visit (INDEPENDENT_AMBULATORY_CARE_PROVIDER_SITE_OTHER): Payer: Commercial Managed Care - HMO | Admitting: Family Nurse Practitioner

## 2022-09-20 ENCOUNTER — Ambulatory Visit (INDEPENDENT_AMBULATORY_CARE_PROVIDER_SITE_OTHER): Payer: 59 | Admitting: Family Medicine

## 2022-09-20 ENCOUNTER — Telehealth: Payer: Self-pay

## 2022-09-20 ENCOUNTER — Encounter (INDEPENDENT_AMBULATORY_CARE_PROVIDER_SITE_OTHER): Payer: Self-pay | Admitting: Critical Care Medicine

## 2022-09-20 ENCOUNTER — Ambulatory Visit (INDEPENDENT_AMBULATORY_CARE_PROVIDER_SITE_OTHER): Payer: 59 | Admitting: Critical Care Medicine

## 2022-09-20 ENCOUNTER — Encounter (INDEPENDENT_AMBULATORY_CARE_PROVIDER_SITE_OTHER): Payer: Self-pay | Admitting: Family Medicine

## 2022-09-20 VITALS — BP 114/82 | HR 70 | Temp 97.9°F | Ht 75.0 in | Wt 243.0 lb

## 2022-09-20 VITALS — BP 141/90 | HR 69 | Temp 97.8°F | Resp 16 | Ht 75.0 in | Wt 243.0 lb

## 2022-09-20 DIAGNOSIS — Z87891 Personal history of nicotine dependence: Secondary | ICD-10-CM

## 2022-09-20 DIAGNOSIS — E669 Obesity, unspecified: Secondary | ICD-10-CM

## 2022-09-20 DIAGNOSIS — M47816 Spondylosis without myelopathy or radiculopathy, lumbar region: Secondary | ICD-10-CM

## 2022-09-20 DIAGNOSIS — I824Z9 Acute embolism and thrombosis of unspecified deep veins of unspecified distal lower extremity: Secondary | ICD-10-CM

## 2022-09-20 DIAGNOSIS — J8283 Eosinophilic asthma: Secondary | ICD-10-CM | POA: Insufficient documentation

## 2022-09-20 DIAGNOSIS — F332 Major depressive disorder, recurrent severe without psychotic features: Secondary | ICD-10-CM

## 2022-09-20 DIAGNOSIS — J4530 Mild persistent asthma, uncomplicated: Secondary | ICD-10-CM | POA: Insufficient documentation

## 2022-09-20 DIAGNOSIS — K219 Gastro-esophageal reflux disease without esophagitis: Secondary | ICD-10-CM

## 2022-09-20 DIAGNOSIS — F4310 Post-traumatic stress disorder, unspecified: Secondary | ICD-10-CM

## 2022-09-20 DIAGNOSIS — I2699 Other pulmonary embolism without acute cor pulmonale: Secondary | ICD-10-CM

## 2022-09-20 DIAGNOSIS — R0609 Other forms of dyspnea: Secondary | ICD-10-CM

## 2022-09-20 DIAGNOSIS — Z0000 Encounter for general adult medical examination without abnormal findings: Secondary | ICD-10-CM

## 2022-09-20 DIAGNOSIS — Z1212 Encounter for screening for malignant neoplasm of rectum: Secondary | ICD-10-CM

## 2022-09-20 DIAGNOSIS — Z23 Encounter for immunization: Secondary | ICD-10-CM

## 2022-09-20 DIAGNOSIS — R030 Elevated blood-pressure reading, without diagnosis of hypertension: Secondary | ICD-10-CM

## 2022-09-20 DIAGNOSIS — I2693 Single subsegmental pulmonary embolism without acute cor pulmonale: Secondary | ICD-10-CM

## 2022-09-20 DIAGNOSIS — D7219 Other eosinophilia: Secondary | ICD-10-CM

## 2022-09-20 DIAGNOSIS — Z125 Encounter for screening for malignant neoplasm of prostate: Secondary | ICD-10-CM

## 2022-09-20 DIAGNOSIS — Z6830 Body mass index (BMI) 30.0-30.9, adult: Secondary | ICD-10-CM

## 2022-09-20 DIAGNOSIS — Z1211 Encounter for screening for malignant neoplasm of colon: Secondary | ICD-10-CM

## 2022-09-20 DIAGNOSIS — Z1159 Encounter for screening for other viral diseases: Secondary | ICD-10-CM | POA: Insufficient documentation

## 2022-09-20 NOTE — Assessment & Plan Note (Signed)
PSA level ordered.

## 2022-09-20 NOTE — Assessment & Plan Note (Signed)
Remains on Xanax to be used as needed and in the process of establishing care with therapist for cognitive able therapy.  PHQ score 3.  Detailed discussion about red flag signs including SI/HI counseling and supportive care provided.

## 2022-09-20 NOTE — Assessment & Plan Note (Signed)
Visit Type: Health Maintenance Visit  Work Status: not currently employed  Reported Health: good health  Diet: compliant with well-balanced diet  Exercise: occasionally  Dental: dentist visit within 6-12 months  Vision: glasses and eye exam > 1 year ago  Hearing: normal hearing  Immunization Status: Shingles vaccination due, Influenza vaccination due, and COVID booster due (declined)  Reproductive Health: sexually active  Prior Screening Tests: unsure last PSA, last colonoscopy in 2023, last LDCT in 2022, and dexa scan not appropriate at this time  General Health Risks: family history of prostate cancer, no family history of colon cancer, and previous colon polyps  Safety Elements Used: uses seat belts, smoke detectors in household, carbon monoxide detectors in household, sunscreen use, and does not text and drive      0/98/1191     4:78 PM 11/05/2021     9:31 AM 07/25/2022    11:33 AM 08/26/2022     7:50 AM   Ambulatory Screenings   Depression: PHQ2 Total Score 6 4 3 2    Depression: PHQ9 Total Score 12 14 5 8       Smoking status: former smoker; quit 2018; occasionally vaping

## 2022-09-20 NOTE — Assessment & Plan Note (Signed)
Remains on gabapentin, reports off-and-on symptoms.  Advised to continue with supportive measures.
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
## 2022-09-20 NOTE — Assessment & Plan Note (Signed)
Advised lifestyle changes.

## 2022-09-20 NOTE — Assessment & Plan Note (Signed)
colonoscopy noted, no reported concerning symptoms at this time.

## 2022-09-20 NOTE — Assessment & Plan Note (Signed)
Recommended high-dose influenza vaccine, shingles vaccine and COVID booster, patient declines

## 2022-09-20 NOTE — Progress Notes (Signed)
[facility]                       Date of Exam: 09/20/2022 10:10 AM        Patient ID: Bill Kaiser is a 56 y.o. male.  Attending Physician: Gillermo Murdoch, MD        Chief Complaint:    Chief Complaint   Patient presents with    Annual Exam     Not fasting today         Assessment and Plan:    Problem List Items Addressed This Visit          Digestive    Gastroesophageal reflux disease without esophagitis (Chronic)    Overview     EGD 02/05/2019 with Dr. Roselyn Bering; on PPI and Pepcid         Current Assessment & Plan      Remains on Pepcid and pantoprazole managed by GI.  Advised lifestyle changes.             Musculoskeletal and Integument    Osteoarthritis of lumbar spine (Chronic)    Current Assessment & Plan     Remains on gabapentin, reports off-and-on symptoms.  Advised to continue with supportive measures.            Other    Recurrent major depressive disorder (Chronic)    Overview     Has tried Prozac in the past  Took Zoloft for a month with side effects  cymbalta made depression worse  lexapro not effective; unable to recall any S/E  On Xanax twice daily as needed          Current Assessment & Plan     Remains on Xanax to be used as needed and in the process of establishing care with therapist for cognitive able therapy.  PHQ score 3.  Detailed discussion about red flag signs including SI/HI counseling and supportive care provided.         Screening for colorectal cancer    Overview     Colonoscopy 04/2018 known to have polyps; repeat done by Dr. Roselyn Bering 02/05/2022         Current Assessment & Plan     colonoscopy noted, no reported concerning symptoms at this time.         Class 1 obesity without serious comorbidity with body mass index (BMI) of 30.0 to 30.9 in adult    Current Assessment & Plan     Advised lifestyle changes.         Need for vaccination    Current Assessment & Plan     Recommended high-dose influenza vaccine, shingles vaccine and COVID booster, patient declines          Encounter for prostate cancer screening    Current Assessment & Plan     PSA level ordered.         Relevant Orders    PROSTATE SPECIFIC ANTIGEN SCREEN    Annual physical exam - Primary    Current Assessment & Plan     Visit Type: Health Maintenance Visit  Work Status: not currently employed  Reported Health: good health  Diet: compliant with well-balanced diet  Exercise: occasionally  Dental: dentist visit within 6-12 months  Vision: glasses and eye exam > 1 year ago  Hearing: normal hearing  Immunization Status: Shingles vaccination due, Influenza vaccination due, and COVID booster due (declined)  Reproductive Health: sexually active  Prior Screening Tests:  unsure last PSA, last colonoscopy in 2023, last LDCT in 2022, and dexa scan not appropriate at this time  General Health Risks: family history of prostate cancer, no family history of colon cancer, and previous colon polyps  Safety Elements Used: uses seat belts, smoke detectors in household, carbon monoxide detectors in household, sunscreen use, and does not text and drive      1/61/0960     4:54 PM 11/05/2021     9:31 AM 07/25/2022    11:33 AM 08/26/2022     7:50 AM   Ambulatory Screenings   Depression: PHQ2 Total Score 6 4 3 2    Depression: PHQ9 Total Score 12 14 5 8       Smoking status: former smoker; quit 2018; occasionally vaping           Relevant Orders    CBC and differential    Comprehensive metabolic panel    Lipid panel    Urinalysis    Elevated blood pressure reading in office without diagnosis of hypertension    Current Assessment & Plan     Denies any concerning symptoms at this time.  Advised amatory blood pressure monitoring along with emphasis on lifestyle changes.  Advised to return in case blood pressure persist times.  Patient reports understanding and agreement of plan.                   Problem List:    Patient Active Problem List   Diagnosis    Recurrent major depressive disorder    PTSD (post-traumatic stress disorder)     Gastroesophageal reflux disease without esophagitis    Osteoarthritis of lumbar spine    Mass of subcutaneous tissue of back    Pigmented skin lesions    Cervical radiculopathy    Former smoker    Screening for colorectal cancer    H/O asplenia    Class 1 obesity without serious comorbidity with body mass index (BMI) of 30.0 to 30.9 in adult    Attention and concentration deficit    Need for vaccination    Encounter for prostate cancer screening    DOE (dyspnea on exertion)    Other fatigue    Acute deep vein thrombosis (DVT) of distal vein of lower extremity, unspecified laterality    Other acute pulmonary embolism without acute cor pulmonale    Annual physical exam    Elevated blood pressure reading in office without diagnosis of hypertension             Current Meds:    Outpatient Medications Marked as Taking for the 09/20/22 encounter (Office Visit) with Gillermo Murdoch, MD   Medication Sig Dispense Refill    ALPRAZolam (Xanax) 0.5 MG tablet Take 1 tablet (0.5 mg) by mouth 2 (two) times daily as needed for Anxiety 90 tablet 0    apixaban (Eliquis) 5 MG Take 1 tablet (5 mg) by mouth every 12 (twelve) hours 180 tablet 0    Ascorbic Acid (VITAMIN C) 1000 MG tablet Take 1 tablet (1,000 mg) by mouth daily 2-3 tablets      b complex vitamins capsule Take 1 capsule by mouth every other day      Cholecalciferol (VITAMIN D3) 3000 units Tab Take by mouth          famotidine (PEPCID) 40 MG tablet Take 0.5 tablets (20 mg total) by mouth 2 (two) times daily (Patient taking differently: Take 0.5 tablets (20 mg) by mouth daily) 60 tablet 5  gabapentin (NEURONTIN) 300 MG capsule Take 1 capsule by mouth every night for 1 week, then 1 capsule by mouth twice a day for 1 week, then 1 capsule by mouth three times a day 90 capsule 2    pantoprazole (PROTONIX) 40 MG tablet Take 1 tablet (40 mg) by mouth daily (Patient taking differently: Take 20 mg by mouth daily) 30 tablet 11    vitamin A 16109 UNIT capsule Take 1 capsule (10,000  Units) by mouth      VITAMIN K PO Take by mouth                Allergies:    Allergies   Allergen Reactions    Penicillins              Past Surgical History:    Past Surgical History:   Procedure Laterality Date    EGD, BIOPSY N/A 08/11/2018    Procedure: EGD, BIOPSY;  Surgeon: Virgina Norfolk, MD;  Location: [facility];  Service: Gastroenterology;  Laterality: N/A;    EGD, COLONOSCOPY N/A 05/13/2018    Procedure: EGD, COLONOSCOPY WITH BIOPSY;  Surgeon: Virgina Norfolk, MD;  Location: [facility];  Service: Gastroenterology;  Laterality: N/A;    EGD, COLONOSCOPY N/A 02/05/2022    Procedure: EGD, COLONOSCOPY;  Surgeon: Leilani Merl, MD;  Location: [facility];  Service: Gastroenterology;  Laterality: N/A;    HERNIA REPAIR      neck injection      cortisone    SPLENECTOMY, TOTAL      TONSILLECTOMY             Family History:    Family History   Problem Relation Age of Onset    Dementia Mother     No known problems Father     No known problems Brother     Malignant hyperthermia Neg Hx     Pseudochol deficiency Neg Hx            Social History:    Social History     Tobacco Use    Smoking status: Former     Types: Cigars    Smokeless tobacco: Never    Tobacco comments:     Occasional vaping   Vaping Use    Vaping Use: Some days   Substance Use Topics    Alcohol use: Not Currently    Drug use: Not Currently           The following sections were reviewed this encounter by the provider:   Tobacco  Allergies  Meds  Problems  Med Hx  Surg Hx  Fam Hx             HPI:    Patient is here for annual physical exam as well as follow-up.  Patient is not currently fasting.  He had labs done between July and earlier in August when seen in the emergency room including CBC, CMP, TSH and A1c.  Has not had his PSA level checked this year and hyperlipidemia screen.  He remains on Eliquis, has appointment scheduled with the pulmonologist as well as a cardiologist but no appointment with blood specialist yet.  Is wondering how long he  will stay on the blood thinner and if he needs to do another CAT scan to see the extent of the clot in his blood vessels.  He was seen in the emergency room August 11 for abdominal pain and bleeding along with spasms and vomiting.  Has not had any  similar symptoms.  Attributes that to eating shrimp that day.  Remains on pantoprazole and Pepcid for acid reflux.  Is wondering if he should be doing low-dose lung CT as he quit smoking 5 years ago but continues to use occasional vape.  Was told by the ER physician that blood clot was related to his smoking history.  He is not hospice establishing care with his therapist/counselor depression/PTSD but has had a long remains on Xanax to be used as needed.  Defers vaccines as he gets weird reaction with vaccines he is looking possibly making an advance care directive but does not have time.                       Vital Signs:    BP 141/90 (BP Site: Left arm, Patient Position: Sitting, Cuff Size: Medium)   Pulse 69   Temp 97.8 F (36.6 C)   Resp 16   Ht 1.905 m (6\' 3" )   Wt 110.2 kg (243 lb)   SpO2 98%   BMI 30.37 kg/m          ROS:    Review of Systems   Constitutional:  Negative for appetite change, chills, diaphoresis, fatigue and fever.   HENT:  Negative for congestion, ear pain, mouth sores, postnasal drip, sinus pressure and trouble swallowing.    Eyes:  Negative for photophobia, pain and visual disturbance.   Respiratory:  Positive for shortness of breath (on exertion, stable). Negative for cough, chest tightness, wheezing and stridor.    Cardiovascular:  Positive for leg swelling (right leg; related to DVT, improving). Negative for chest pain and palpitations.   Gastrointestinal:  Negative for abdominal distention, abdominal pain, blood in stool, constipation, diarrhea, nausea and vomiting.   Endocrine: Negative for polydipsia, polyphagia and polyuria.   Genitourinary:  Negative for decreased urine volume, difficulty urinating, dysuria, flank pain, frequency  and urgency.   Musculoskeletal:  Positive for arthralgias, back pain, myalgias and neck pain. Negative for gait problem and joint swelling.   Skin:  Negative for color change and rash.   Allergic/Immunologic: Negative for environmental allergies and food allergies.   Neurological:  Positive for numbness. Negative for dizziness, tremors, syncope, weakness, light-headedness and headaches.   Hematological:  Negative for adenopathy. Does not bruise/bleed easily.   Psychiatric/Behavioral:  Positive for dysphoric mood. Negative for confusion, decreased concentration, hallucinations and sleep disturbance. The patient is nervous/anxious.               Physical Exam:    Physical Exam  Constitutional:       General: He is awake.      Appearance: Normal appearance. He is well-developed. He is obese.   HENT:      Head: Normocephalic and atraumatic.      Right Ear: External ear normal.      Left Ear: External ear normal.      Nose: No nasal tenderness, mucosal edema, congestion or rhinorrhea.      Right Turbinates: Not enlarged or swollen.      Left Turbinates: Not enlarged or swollen.      Right Sinus: No maxillary sinus tenderness or frontal sinus tenderness.      Left Sinus: No maxillary sinus tenderness or frontal sinus tenderness.      Mouth/Throat:      Lips: Pink.      Mouth: Mucous membranes are moist. No oral lesions.      Dentition: Normal dentition.  Tongue: No lesions.      Palate: No mass.      Pharynx: Oropharynx is clear. Uvula midline. No oropharyngeal exudate or posterior oropharyngeal erythema.      Comments: Unable to fully visualize the posterior pharynx.  Eyes:      General: Lids are normal.         Right eye: No discharge.         Left eye: No discharge.      Conjunctiva/sclera: Conjunctivae normal.   Neck:      Thyroid: No thyroid mass or thyroid tenderness.      Vascular: No carotid bruit.      Trachea: Trachea normal.   Cardiovascular:      Rate and Rhythm: Normal rate and regular rhythm.       Pulses:           Radial pulses are 2+ on the right side and 2+ on the left side.      Heart sounds: Normal heart sounds, S1 normal and S2 normal. No murmur heard.  Pulmonary:      Effort: Pulmonary effort is normal.      Breath sounds: Normal breath sounds and air entry. No decreased breath sounds, wheezing, rhonchi or rales.   Abdominal:      General: Abdomen is protuberant. Bowel sounds are normal.      Palpations: Abdomen is soft. There is no mass.      Tenderness: There is no abdominal tenderness. There is no right CVA tenderness or left CVA tenderness.   Genitourinary:     Comments: Exam deferred.  Musculoskeletal:      Cervical back: Spasms present. No rigidity, tenderness or bony tenderness. Muscular tenderness present. No spinous process tenderness. Normal range of motion.      Thoracic back: Normal. No spasms, tenderness or bony tenderness.      Lumbar back: Spasms present. No tenderness or bony tenderness.      Right lower leg: 1+ Edema present.      Left lower leg: No edema.   Lymphadenopathy:      Cervical: No cervical adenopathy.   Skin:     Comments: Multiple pigmented skin lesion noted   Neurological:      Mental Status: He is alert.      Cranial Nerves: No dysarthria or facial asymmetry.      Motor: No tremor.   Psychiatric:         Attention and Perception: Attention and perception normal.         Mood and Affect: Mood and affect normal.         Speech: Speech normal.         Behavior: Behavior normal. Behavior is cooperative.         Thought Content: Thought content normal. Thought content does not include homicidal or suicidal ideation.      Comments: PHQ score 3             Little interest or pleasure in doing things: 1 (09/20/2022  9:47 AM)  Feeling down, depressed, or hopeless: 1 (09/20/2022  9:47 AM)  Trouble falling or staying asleep, or sleeping too much: 0 (09/20/2022  9:47 AM)  Feeling tired or having little energy: 0 (09/20/2022  9:47 AM)  Poor appetite or overeating: 0 (09/20/2022  9:47  AM)  Feeling bad about yourself - or that you are a failure or have let yourself or your family down: 1 (09/20/2022  9:47 AM)  Trouble concentrating  on things, such as reading the newspaper or watching television: 0 (09/20/2022  9:47 AM)  Moving or speaking so slowly that other people could have noticed. Or the opposite - being so fidgety or restless that you have been moving around a lot more than usual: 0 (09/20/2022  9:47 AM)  Thoughts that you would be better off dead, or of hurting yourself in some way: 0 (09/20/2022  9:47 AM)  PHQ Total Score: 3 (09/20/2022  9:47 AM)  If you checked off any problems, how difficult have these problems made it for you to do your work, take care of things at home, or get along with other people?: Somewhat difficult (09/20/2022  9:47 AM)       Follow-up:    Return if symptoms worsen or fail to improve, for BP.         Gillermo Murdoch, MD           This note was generated using voice recognition software which may contain typographical, grammatical and word choice errors.

## 2022-09-20 NOTE — Assessment & Plan Note (Signed)
Denies any concerning symptoms at this time.  Advised amatory blood pressure monitoring along with emphasis on lifestyle changes.  Advised to return in case blood pressure persist times.  Patient reports understanding and agreement of plan.

## 2022-09-20 NOTE — Assessment & Plan Note (Signed)
Remains on Pepcid and pantoprazole managed by GI.  Advised lifestyle changes.

## 2022-09-20 NOTE — Telephone Encounter (Signed)
Patient called wanting to schedule a new patient appointment, The patient has been referred by his PCP to see Dr. Leonides Schanz for a PE and DVT. I reviewed with the patient the Nurse Navigator will review the his chart for appropriate scheduling time frame and I will contact him back to schedule the appointment. The patient express understanding.

## 2022-09-21 ENCOUNTER — Telehealth (INDEPENDENT_AMBULATORY_CARE_PROVIDER_SITE_OTHER): Payer: Self-pay | Admitting: Internal Medicine

## 2022-09-21 ENCOUNTER — Other Ambulatory Visit (INDEPENDENT_AMBULATORY_CARE_PROVIDER_SITE_OTHER): Payer: Self-pay | Admitting: Internal Medicine

## 2022-09-21 ENCOUNTER — Encounter (INDEPENDENT_AMBULATORY_CARE_PROVIDER_SITE_OTHER): Payer: Self-pay | Admitting: Critical Care Medicine

## 2022-09-21 MED ORDER — FLUTICASONE FUROATE-VILANTEROL 100-25 MCG/ACT IN AEPB
1.0000 | INHALATION_SPRAY | Freq: Every day | RESPIRATORY_TRACT | 3 refills | Status: DC
Start: 2022-09-21 — End: 2022-09-25

## 2022-09-21 MED ORDER — ALBUTEROL SULFATE HFA 108 (90 BASE) MCG/ACT IN AERS
2.0000 | INHALATION_SPRAY | RESPIRATORY_TRACT | 5 refills | Status: DC | PRN
Start: 2022-09-21 — End: 2024-02-03

## 2022-09-21 NOTE — Telephone Encounter (Signed)
Patient called asking for medications. Saw Dr. Lemar Livings yesterday who wrote in note that he would order Breo and Albuterol but not sent in. Ordered medications and sent to [facility] for him.     Called patient and informed him.

## 2022-09-25 ENCOUNTER — Other Ambulatory Visit (FREE_STANDING_LABORATORY_FACILITY): Payer: 59

## 2022-09-25 ENCOUNTER — Other Ambulatory Visit (INDEPENDENT_AMBULATORY_CARE_PROVIDER_SITE_OTHER): Payer: Self-pay

## 2022-09-25 DIAGNOSIS — Z125 Encounter for screening for malignant neoplasm of prostate: Secondary | ICD-10-CM

## 2022-09-25 DIAGNOSIS — Z0000 Encounter for general adult medical examination without abnormal findings: Secondary | ICD-10-CM

## 2022-09-25 LAB — URINALYSIS, REFLEX TO MICROSCOPIC EXAM IF INDICATED
Bilirubin, UA: NEGATIVE
Blood, UA: NEGATIVE
Glucose, UA: NEGATIVE
Ketones UA: NEGATIVE
Leukocyte Esterase, UA: NEGATIVE
Nitrite, UA: NEGATIVE
Protein, UR: NEGATIVE
Specific Gravity UA: 1.008 (ref 1.001–1.035)
Urine pH: 6.5 (ref 5.0–8.0)
Urobilinogen, UA: NORMAL mg/dL

## 2022-09-25 LAB — HEMOLYSIS INDEX: Hemolysis Index: 6 Index (ref 0–24)

## 2022-09-25 LAB — CBC AND DIFFERENTIAL
Absolute NRBC: 0 10*3/uL (ref 0.00–0.00)
Basophils Absolute Automated: 0.02 10*3/uL (ref 0.00–0.08)
Basophils Automated: 0.3 %
Eosinophils Absolute Automated: 0.21 10*3/uL (ref 0.00–0.44)
Eosinophils Automated: 3 %
Hematocrit: 46.9 % (ref 37.6–49.6)
Hgb: 14.7 g/dL (ref 12.5–17.1)
Immature Granulocytes Absolute: 0.02 10*3/uL (ref 0.00–0.07)
Immature Granulocytes: 0.3 %
Instrument Absolute Neutrophil Count: 4.18 10*3/uL (ref 1.10–6.33)
Lymphocytes Absolute Automated: 1.91 10*3/uL (ref 0.42–3.22)
Lymphocytes Automated: 27.1 %
MCH: 30.4 pg (ref 25.1–33.5)
MCHC: 31.3 g/dL — ABNORMAL LOW (ref 31.5–35.8)
MCV: 97.1 fL — ABNORMAL HIGH (ref 78.0–96.0)
MPV: 10.9 fL (ref 8.9–12.5)
Monocytes Absolute Automated: 0.71 10*3/uL (ref 0.21–0.85)
Monocytes: 10.1 %
Neutrophils Absolute: 4.18 10*3/uL (ref 1.10–6.33)
Neutrophils: 59.2 %
Nucleated RBC: 0 /100 WBC (ref 0.0–0.0)
Platelets: 253 10*3/uL (ref 142–346)
RBC: 4.83 10*6/uL (ref 4.20–5.90)
RDW: 13 % (ref 11–15)
WBC: 7.05 10*3/uL (ref 3.10–9.50)

## 2022-09-25 LAB — LIPID PANEL
Cholesterol / HDL Ratio: 4.4 Index
Cholesterol: 203 mg/dL — ABNORMAL HIGH (ref 0–199)
HDL: 46 mg/dL (ref 40–9999)
LDL Calculated: 118 mg/dL — ABNORMAL HIGH (ref 0–99)
Triglycerides: 193 mg/dL — ABNORMAL HIGH (ref 34–149)
VLDL Calculated: 39 mg/dL (ref 10–40)

## 2022-09-25 LAB — COMPREHENSIVE METABOLIC PANEL
ALT: 32 U/L (ref 0–55)
AST (SGOT): 31 U/L (ref 5–41)
Albumin/Globulin Ratio: 1.2 (ref 0.9–2.2)
Albumin: 4.2 g/dL (ref 3.5–5.0)
Alkaline Phosphatase: 56 U/L (ref 37–117)
Anion Gap: 6 (ref 5.0–15.0)
BUN: 13 mg/dL (ref 9.0–28.0)
Bilirubin, Total: 0.6 mg/dL (ref 0.2–1.2)
CO2: 28 mEq/L (ref 17–29)
Calcium: 9.5 mg/dL (ref 8.5–10.5)
Chloride: 101 mEq/L (ref 99–111)
Creatinine: 0.9 mg/dL (ref 0.5–1.5)
Globulin: 3.5 g/dL (ref 2.0–3.6)
Glucose: 88 mg/dL (ref 70–100)
Potassium: 4.9 mEq/L (ref 3.5–5.3)
Protein, Total: 7.7 g/dL (ref 6.0–8.3)
Sodium: 135 mEq/L (ref 135–145)
eGFR: >60 mL/min/{1.73_m2} (ref >=60)

## 2022-09-25 LAB — PROSTATE SPECIFIC ANTIGEN SCREEN: Prostate Specific Antigen Screen: 0.292 ng/mL (ref 0.000–4.000)

## 2022-09-25 MED ORDER — ADVAIR DISKUS 250-50 MCG/ACT IN AEPB
1.0000 | INHALATION_SPRAY | Freq: Two times a day (BID) | RESPIRATORY_TRACT | 5 refills | Status: DC
Start: 2022-09-25 — End: 2023-03-19

## 2022-09-26 ENCOUNTER — Ambulatory Visit (INDEPENDENT_AMBULATORY_CARE_PROVIDER_SITE_OTHER): Payer: Commercial Managed Care - HMO | Admitting: Critical Care Medicine

## 2022-10-02 ENCOUNTER — Telehealth (INDEPENDENT_AMBULATORY_CARE_PROVIDER_SITE_OTHER): Payer: Self-pay | Admitting: Family Medicine

## 2022-10-02 ENCOUNTER — Encounter (INDEPENDENT_AMBULATORY_CARE_PROVIDER_SITE_OTHER): Payer: Self-pay | Admitting: Family Medicine

## 2022-10-02 ENCOUNTER — Emergency Department
Admission: EM | Admit: 2022-10-02 | Discharge: 2022-10-02 | Disposition: A | Discharge status: Home / Self Care | Payer: 59 | Attending: Student in an Organized Health Care Education/Training Program | Admitting: Student in an Organized Health Care Education/Training Program

## 2022-10-02 ENCOUNTER — Other Ambulatory Visit (INDEPENDENT_AMBULATORY_CARE_PROVIDER_SITE_OTHER): Payer: Self-pay | Admitting: Critical Care Medicine

## 2022-10-02 DIAGNOSIS — Z008 Encounter for other general examination: Secondary | ICD-10-CM

## 2022-10-02 DIAGNOSIS — I824Y1 Acute embolism and thrombosis of unspecified deep veins of right proximal lower extremity: Secondary | ICD-10-CM | POA: Insufficient documentation

## 2022-10-02 LAB — ECG 12-LEAD
Atrial Rate: 63 {beats}/min
IHS MUSE NARRATIVE AND IMPRESSION: NORMAL
P Axis: 24 degrees
P-R Interval: 166 ms
Q-T Interval: 422 ms
QRS Duration: 78 ms
QTC Calculation (Bezet): 431 ms
R Axis: -25 degrees
T Axis: 22 degrees
Ventricular Rate: 63 {beats}/min

## 2022-10-02 NOTE — ED Provider Notes (Signed)
[facility]  ATTENDING PHYSICIAN HISTORY AND PHYSICAL EXAM     Patient Name: Bill BanksROBINSON,Horris T  Department:[facility]  Encounter Date:  10/02/2022  Attending Physician: Dayton ScrapeKamei, Rovena Hearld, MD   Age: 56 y.o. male  Patient Room: S 6/S 6  PCP: Gillermo MurdochSohail, Irum, MD           Diagnosis/Disposition:     Final diagnoses:   None       ED Disposition       None            Follow-Up Providers (if applicable)    No follow-up provider specified.     New Prescriptions    No medications on file           Medical Decision Making:       Plan:  Bill BanksJohn T Josephs is a 56 y.o. male with a past medical history of PTSD, GERD, PE, DVT to ED with a chief complaint of DVT.   Patient has a known history of DVT and PE and has been taking his Eliquis as prescribed.  Patient was sent to outpatient imaging to have a right lower extremity ultrasound which confirmed suspected DVT.  Patient without hypoxia, tachycardia, shortness of breath, chest pain at this time.  EKG was obtained and noted normal sinus rhythm with left axis deviation but otherwise similar to previous.  Will discharge patient home with PCP follow-up as scheduled.    Final Impression:  DVT  The patient was deemed stable for discharge. They were given strict return precautions as it relates to their presumed diagnosis, verbalized understanding of these precautions and agreed to follow up as instructed. All questions were answered prior to discharge.         Medical Decision Making                          History of Presenting Illness:     Nursing Triage note: Patient sent from [place] [facility] center after US showed clots in RLE. Per patient, +for PE about a month and 3 wks ago, had a concern for DVT however did not get the order for US to be done until today. Patient on Eliquis 5mg  for PE. Endorses intermittent SOB. Denies CP at this time. +Swelling/Redess/Warmth to RLE.  Chief complaint: Blood Clots and Leg Pain    Bill BanksJohn T Kaiser is a 56 y.o. male with a past medical  history of PTSD, GERD, PE, DVT to ED with a chief complaint of DVT.  Patient states that about 7 days ago he was diagnosed with PE.  At that time she also had pain in his right lower extremity.  He did not obtain a Doppler at that time but he was started on Eliquis for PE and presumed DVT.  Patient states that he was [facility] today and had a right lower extremity duplex that was concerning for DVT.  Patient states that after being told he has a DVT he presented to be seen in emergency department to be seen.  He was not instructed by his PCP to come to the emergency department.  He has been taking his liquids as prescribed he denies any chest pain, shortness of breath, nausea, vomiting, fever, chills.          Review of Systems:  Physical Exam:     Review of Systems    Positive and negative ROS per above and in HPI. All other  systems reviewed and negative.     Pulse 76  BP 127/83  Resp 16  SpO2 100 %  Temp 97.7 F (36.5 C)     Physical Exam  GENERAL APPEARANCE:  AxOx4, generally well-appearing,no acute distress.  HEENT:  NC, AT. MMM. EOMI, clear conjunctiva, oropharynx clear.  NECK:  Supple without lymphadenopathy.  No stiffness or restricted ROM.  HEART:  Normal rate and regular rhythm  LUNGS:  CTAB, moving air well. No crackles or wheezes are heard.  ABDOMEN:  Soft, nontender, nondistended   BACK: No CVAT, no obvious deformity.  EXTREMITIES: Mild swelling noted in the right lower extremity compared to the left.  Without cyanosis, clubbing.   NEUROLOGICAL:  Grossly nonfocal. Alert and oriented x4, moving all 4 extremities, strength intact sensation intact. CN not formally tested but appear grossly intact. Observed to ambulate with normal gait.  Skin:  Warm and dry without any rash.           Interpretations, Clinical Decision Tools and Critical Care:                    Procedures:   Procedures      Attestations:     Scribe Attestation: There was no scribe involved in the care of this  patient.     Documentation Notes:  Parts of this note were generated by the Epic EMR system/ Dragon speech recognition and may contain inherent errors or omissions not intended by the user. Grammatical errors, random word insertions, deletions, pronoun errors and incomplete sentences are occasional consequences of this technology due to software limitations. Not all errors are caught or corrected.  My documentation is often completed after the patient is no longer under my clinical care. In some cases, the Epic EMR may pull updated results into the above documentation which may not reflect all results or information that were available to me at the time of my medical decision making.   If there are questions or concerns about the content of this note or information contained within the body of this dictation they should be addressed directly with the author for clarification.                  Dayton Scrape, MD  10/05/22 [phone]

## 2022-10-02 NOTE — Telephone Encounter (Signed)
Patient was called with no answer and unable to leave a voicemail.  Message sent via MyChart in response to his message earlier to go to the emergency room.  I see that patient is already in the emergency room at this time.

## 2022-10-02 NOTE — Discharge Instructions (Addendum)
Please follow up with your PCP and pulmonologist in clinic.

## 2022-10-02 NOTE — ED Triage Notes (Signed)
Pt presents to ED with c/o possible blood clot to RLE. Pt was diagnosed with PE approx 7 weeks ago and was prescribed eliquis. Pt has been taking eliquis as prescribed. Pt also states at that time they diagnosed him with a DVT to RLE, however never had any testing done. RLE swollen, red, and warm to touch, which began yesterday. Endorses moderate intermittent pain to RLE. Denies CP SOB at this time.

## 2022-10-02 NOTE — Telephone Encounter (Signed)
Pt called - stated per [place] this morning several clots detected and needs to speak to the provider  as how to proceed. Pt stated per Imaging not  sure if pt should  go to ER or work.   Pt requested a return call asap.

## 2022-10-07 ENCOUNTER — Encounter (INDEPENDENT_AMBULATORY_CARE_PROVIDER_SITE_OTHER): Payer: Self-pay

## 2022-10-09 ENCOUNTER — Ambulatory Visit (INDEPENDENT_AMBULATORY_CARE_PROVIDER_SITE_OTHER): Payer: Commercial Managed Care - HMO | Admitting: Student in an Organized Health Care Education/Training Program

## 2022-10-30 LAB — SPIROMETRY
FEF 25-75% (Pre-Bronch) %Pred: 59 %
FEF 25-75% (Pre-Bronch) Actual: 2.13 L/sec
FEF 25-75% (Pre-Bronch) Pred: 3.56 L/sec
FEF Max (Pre-Bronch) %Pred: 73 %
FEF Max (Pre-Bronch) Actual: 7.74 L/sec
FEF Max (Pre-Bronch) Pred: 10.6 L/sec
FEF2575 (Lower Limit of Normal): 1.8 L/sec
FEF2575 (Standard Deviation): 1.25 L/sec
FEFMax (Lower Limit of Normal): 7.93 L/sec
FEFMax (Standard Deviation): 1.62 L/sec
FEV1 (Lower Limit of Normal): 3.29 L
FEV1 (Pre-Bronch) %Pred: 72 %
FEV1 (Pre-Bronch) Actual: 3.13 L
FEV1 (Pre-Bronch) Pred: 4.31 L
FEV1 (Standard Deviation): 0.6 L
FEV1/FVC (Pre-Bronch) %Pred: 93 %
FEV1/FVC (Pre-Bronch) Actual: 72 %
FEV1/FVC (Pre-Bronch) Pred: 77 %
FVC (Lower Limit of Normal): 4.32 L
FVC (Pre-Bronch) %Pred: 77 %
FVC (Pre-Bronch) Actual: 4.33 L
FVC (Pre-Bronch) Pred: 5.62 L
FVC (Standard Deviation): 0.79 L
PEF (Pre-Actual): 464.5 L/min

## 2022-11-05 ENCOUNTER — Ambulatory Visit: Payer: 59 | Admitting: Hematology & Oncology

## 2022-11-06 ENCOUNTER — Telehealth: Payer: Self-pay

## 2022-11-06 ENCOUNTER — Ambulatory Visit (INDEPENDENT_AMBULATORY_CARE_PROVIDER_SITE_OTHER): Payer: Commercial Managed Care - HMO | Admitting: Student in an Organized Health Care Education/Training Program

## 2022-11-06 ENCOUNTER — Ambulatory Visit: Payer: 59 | Admitting: Hematology & Oncology

## 2022-11-06 ENCOUNTER — Ambulatory Visit: Payer: 59 | Admitting: Anesthesiology

## 2022-11-06 NOTE — Telephone Encounter (Signed)
Left the patient a voice message to return my call to reschedule his appointment with Dr. Carney Bern. My name and call back information was provided.

## 2022-11-11 ENCOUNTER — Telehealth (INDEPENDENT_AMBULATORY_CARE_PROVIDER_SITE_OTHER): Payer: Self-pay

## 2022-11-11 ENCOUNTER — Encounter (INDEPENDENT_AMBULATORY_CARE_PROVIDER_SITE_OTHER): Payer: Self-pay

## 2022-11-11 NOTE — Telephone Encounter (Signed)
Please review the letter from the insurance regarding to medication.Letter scanned into file

## 2022-12-04 ENCOUNTER — Ambulatory Visit (INDEPENDENT_AMBULATORY_CARE_PROVIDER_SITE_OTHER): Payer: Commercial Managed Care - HMO | Admitting: Student in an Organized Health Care Education/Training Program

## 2022-12-05 ENCOUNTER — Telehealth (INDEPENDENT_AMBULATORY_CARE_PROVIDER_SITE_OTHER): Payer: 59 | Admitting: Family Medicine

## 2022-12-05 ENCOUNTER — Encounter (INDEPENDENT_AMBULATORY_CARE_PROVIDER_SITE_OTHER): Payer: Self-pay | Admitting: Family Medicine

## 2022-12-05 DIAGNOSIS — I2699 Other pulmonary embolism without acute cor pulmonale: Secondary | ICD-10-CM

## 2022-12-05 DIAGNOSIS — I824Z9 Acute embolism and thrombosis of unspecified deep veins of unspecified distal lower extremity: Secondary | ICD-10-CM

## 2022-12-05 DIAGNOSIS — J019 Acute sinusitis, unspecified: Secondary | ICD-10-CM | POA: Insufficient documentation

## 2022-12-05 DIAGNOSIS — J8283 Eosinophilic asthma: Secondary | ICD-10-CM

## 2022-12-05 MED ORDER — AZITHROMYCIN 250 MG PO TABS
ORAL_TABLET | ORAL | 0 refills | Status: AC
Start: 2022-12-05 — End: 2022-12-10

## 2022-12-05 NOTE — Assessment & Plan Note (Signed)
Given the nature and duration of symptoms will treat with azithromycin for presumed bacterial sinusitis.  Medication side effects and safety profile discussed.  Since patient within 5-day of symptoms discussed about curbside testing for rapid COVID, flu which patient defers but has a home COVID test and will report back in case he is positive.  Advised to continue with supportive measures in the interim.  Has pulse ox at home, advised to monitor his oxygen levels and will discuss further with his pulmonologist as he has an appointment next week to see if he needs ambulatory oxygen although his most recent vitals including pulse ox in the emergency room and consults have been within normal limits.  Red flag signs emphasized.

## 2022-12-05 NOTE — Assessment & Plan Note (Signed)
Remains on Eliquis, consult notes reviewed.  Advised to communicate pictures of the skin/rash the patient reports for further recommendations.  Red flag signs emphasized.

## 2022-12-05 NOTE — Progress Notes (Signed)
[facility]                       Date of Exam: 12/05/2022 8:36 PM        Patient ID: Bill Kaiser is a 56 y.o. male.  Attending Physician: Gillermo Murdoch, MD        Chief Complaint:    Chief Complaint   Patient presents with    Sinusitis     Yellow discharge, chills, cough fever 2 days ago. Symptoms started 4 days ago. Pt been taking Vitamin C and chicken broth          Assessment and Plan:    Problem List Items Addressed This Visit          Cardiovascular and Mediastinum    Acute deep vein thrombosis (DVT) of distal vein of lower extremity, unspecified laterality (Chronic)    Overview     Ultrasound lower extremity 10/02/2021 noted for acute occlusive and nonocclusive deep venous thrombosis extending from the distal right femoral vein into the calf veins.         Current Assessment & Plan     Remains on Eliquis, consult notes reviewed.  Advised to communicate pictures of the skin/rash the patient reports for further recommendations.  Red flag signs emphasized.         Other acute pulmonary embolism without acute cor pulmonale (Chronic)    Current Assessment & Plan     Remains on Eliquis and will be following up with hematology.  Consult notes with pulmonology reviewed.            Respiratory    Eosinophilic asthma (Chronic)    Current Assessment & Plan     Doing well on current inhaler regimen initiated by pulmonology, possible notes reviewed.         Acute non-recurrent sinusitis, unspecified location - Primary    Current Assessment & Plan     Given the nature and duration of symptoms will treat with azithromycin for presumed bacterial sinusitis.  Medication side effects and safety profile discussed.  Since patient within 5-day of symptoms discussed about curbside testing for rapid COVID, flu which patient defers but has a home COVID test and will report back in case he is positive.  Advised to continue with supportive measures in the interim.  Has pulse ox at home, advised to  monitor his oxygen levels and will discuss further with his pulmonologist as he has an appointment next week to see if he needs ambulatory oxygen although his most recent vitals including pulse ox in the emergency room and consults have been within normal limits.  Red flag signs emphasized.         Relevant Medications    azithromycin (ZITHROMAX) 250 MG tablet              Problem List:    Patient Active Problem List   Diagnosis    Recurrent major depressive disorder    PTSD (post-traumatic stress disorder)    Gastroesophageal reflux disease without esophagitis    Osteoarthritis of lumbar spine    Mass of subcutaneous tissue of back    Pigmented skin lesions    Cervical radiculopathy    Former smoker    Screening for colorectal cancer    H/O asplenia    Class 1 obesity without serious comorbidity with body mass index (BMI) of 30.0 to 30.9 in adult    Attention and concentration deficit  Need for vaccination    Encounter for prostate cancer screening    DOE (dyspnea on exertion)    Other fatigue    Acute deep vein thrombosis (DVT) of distal vein of lower extremity, unspecified laterality    Other acute pulmonary embolism without acute cor pulmonale    Annual physical exam    Elevated blood pressure reading in office without diagnosis of hypertension    Mild persistent asthma without complication    Eosinophilic asthma    Acute non-recurrent sinusitis, unspecified location             Current Meds:    Outpatient Medications Marked as Taking for the 12/05/22 encounter (Telemedicine Visit) with Gillermo Murdoch, MD   Medication Sig Dispense Refill    Advair Diskus 250-50 MCG/ACT Aerosol Pwdr, Breath Activated Inhale 1 puff into the lungs 2 (two) times daily 1 each 5    albuterol sulfate HFA (PROVENTIL) 108 (90 Base) MCG/ACT inhaler Inhale 2 puffs into the lungs every 4 (four) hours as needed for Wheezing 1 each 5    alpha lipoic acid 200 MG Cap Take by mouth      ALPRAZolam (Xanax) 0.5 MG tablet Take 1 tablet (0.5 mg) by  mouth 2 (two) times daily as needed for Anxiety 90 tablet 0    apixaban (Eliquis) 5 MG Take 1 tablet (5 mg) by mouth every 12 (twelve) hours 180 tablet 0    Ascorbic Acid (VITAMIN C) 1000 MG tablet Take 1 tablet (1,000 mg) by mouth daily 2-3 tablets      b complex vitamins capsule Take 1 capsule by mouth every other day      Cholecalciferol (VITAMIN D3) 3000 units Tab Take by mouth          gabapentin (NEURONTIN) 300 MG capsule Take 1 capsule by mouth every night for 1 week, then 1 capsule by mouth twice a day for 1 week, then 1 capsule by mouth three times a day 90 capsule 2    magnesium 30 MG tablet Take 1 tablet (30 mg) by mouth 2 (two) times daily      pantoprazole (PROTONIX) 40 MG tablet Take 1 tablet (40 mg) by mouth daily (Patient taking differently: Take 20 mg by mouth daily) 30 tablet 11    vitamin A 16109 UNIT capsule Take 1 capsule (10,000 Units) by mouth      VITAMIN K PO Take by mouth                Allergies:    Allergies   Allergen Reactions    Penicillins              Past Surgical History:    Past Surgical History:   Procedure Laterality Date    EGD, BIOPSY N/A 08/11/2018    Procedure: EGD, BIOPSY;  Surgeon: Virgina Norfolk, MD;  Location: [facility];  Service: Gastroenterology;  Laterality: N/A;    EGD, COLONOSCOPY N/A 05/13/2018    Procedure: EGD, COLONOSCOPY WITH BIOPSY;  Surgeon: Virgina Norfolk, MD;  Location: [facility];  Service: Gastroenterology;  Laterality: N/A;    EGD, COLONOSCOPY N/A 02/05/2022    Procedure: EGD, COLONOSCOPY;  Surgeon: Leilani Merl, MD;  Location: [facility];  Service: Gastroenterology;  Laterality: N/A;    HERNIA REPAIR      neck injection      cortisone    SPLENECTOMY, TOTAL      TONSILLECTOMY  Family History:    Family History   Problem Relation Age of Onset    Dementia Mother     No known problems Father     No known problems Brother     Malignant hyperthermia Neg Hx     Pseudochol deficiency Neg Hx            Social History:    Social History     Tobacco  Use    Smoking status: Former     Types: Cigars    Smokeless tobacco: Never    Tobacco comments:     Occasional vaping   Vaping Use    Vaping Use: Some days   Substance Use Topics    Alcohol use: Yes     Comment: occ    Drug use: Not Currently           The following sections were reviewed this encounter by the provider:   Tobacco  Allergies  Meds  Problems  Med Hx  Surg Hx  Fam Hx             HPI:    This is a telehealth visit which was conducted with the use of two-way, synchronous, audio and visual interactive telecommunication that permitted real time communication between Bill Kaiser and myself.      he  consented to participation and received services at home understanding the limitations such as detailed physical exam, while we were both located in the state of [place].      This visit was changed from an in-person visit to a telehealth visit to lower the risk of exposure and / or spread of the current pandemic with the SARS CoV-2 virus. This is based on the guidelines from the [facility] and other health agencies.      Video visit conducted upon patient request for concerns for sinus infection.  Patient reports he had fever as high as 100 degrees but has been taking aspirin that this helping.  He reports chills and yellow discharge when he blows out his nose but he is also been having some cough with scant thick clear mucus.  Symptoms have been going on for the past 4 days.  He has been doing home remedies in addition to vitamin C and taking chicken broth.  Usually gets prescription for azithromycin and requesting he is also concerned about his oxygen levels and is wondering if he needs oxygen.  He has a pulse ox at home but has not checked his oxygen levels recently.  He went to the emergency room October 4 for concerns for leg swelling and had outpatient Doppler done noted for right lower extremity DVT.  Was stable on physical exam but EKG unremarkable and advised follow-up with subspecialty as outpatient.   He has also seen his pulmonologist September 22 and diagnosed with eosinophilic asthma with new inhaler added.  Has appointment scheduled next week.  He has noted a rash on the inside of the ankle and is wondering if it is related to the cold water medication.  He also has an appointment scheduled with his cardiologist next week.  He has had to take a lot of time out of his work because of his appointments.  He was advised to talk to his employer/HR department/appropriate personnel for intermittent leave as he is not has been seeing his specialist to accommodate his doctors appointments.  Vital Signs:    There were no vitals taken for this visit.         ROS:    Review of Systems   Constitutional:  Positive for chills, diaphoresis (2 episodes), fatigue and fever.   HENT:  Positive for congestion, dental problem (molar right side), ear pain (right ear), postnasal drip, rhinorrhea, sinus pressure, sinus pain, sneezing and tinnitus. Negative for nosebleeds, sore throat and trouble swallowing.    Eyes:  Positive for redness. Negative for pain and itching.   Respiratory:  Positive for cough (occassional white  phlegm) and shortness of breath. Negative for chest tightness.    Gastrointestinal:  Positive for abdominal pain and diarrhea (imrproved). Negative for constipation, nausea and vomiting.   Genitourinary:  Negative for dysuria, flank pain, frequency and urgency.   Musculoskeletal:  Negative for arthralgias, back pain and joint swelling.   Neurological:  Positive for light-headedness and headaches.   Hematological:  Positive for adenopathy.              Physical Exam:    Physical Exam  Constitutional:       General: He is awake.      Appearance: Normal appearance. He is well-developed. He is not ill-appearing.   HENT:      Head: Normocephalic and atraumatic.      Mouth/Throat:      Lips: No lesions.   Eyes:      General: No scleral icterus.  Neurological:      Mental Status: He is alert.       Cranial Nerves: No dysarthria or facial asymmetry.   Psychiatric:         Attention and Perception: Attention normal.         Mood and Affect: Mood and affect normal.         Speech: Speech normal.         Behavior: Behavior is cooperative.                Follow-up:    Return if symptoms worsen or fail to improve.         Gillermo Murdoch, MD           This note was generated using voice recognition software which may contain typographical, grammatical and word choice errors.

## 2022-12-05 NOTE — Assessment & Plan Note (Signed)
Doing well on current inhaler regimen initiated by pulmonology, possible notes reviewed.

## 2022-12-05 NOTE — Assessment & Plan Note (Signed)
Remains on Eliquis and will be following up with hematology.  Consult notes with pulmonology reviewed.

## 2022-12-05 NOTE — Progress Notes (Signed)
Have you seen any specialists/other providers since your last visit with us?    No    Health Maintenance Due   Topic Date Due    Advance Directive on File  Never done

## 2022-12-06 ENCOUNTER — Telehealth (INDEPENDENT_AMBULATORY_CARE_PROVIDER_SITE_OTHER): Payer: Self-pay | Admitting: Critical Care Medicine

## 2022-12-06 NOTE — Telephone Encounter (Signed)
Left message letting patient know we rescheduled appointment to 12/13 @ 12:00pm at [facility]

## 2022-12-09 ENCOUNTER — Encounter (INDEPENDENT_AMBULATORY_CARE_PROVIDER_SITE_OTHER): Payer: Self-pay | Admitting: Student in an Organized Health Care Education/Training Program

## 2022-12-09 ENCOUNTER — Ambulatory Visit (INDEPENDENT_AMBULATORY_CARE_PROVIDER_SITE_OTHER): Payer: 59 | Admitting: Student in an Organized Health Care Education/Training Program

## 2022-12-09 VITALS — BP 153/71 | HR 76 | Ht 73.23 in | Wt 244.4 lb

## 2022-12-09 DIAGNOSIS — R0609 Other forms of dyspnea: Secondary | ICD-10-CM

## 2022-12-09 DIAGNOSIS — R5383 Other fatigue: Secondary | ICD-10-CM

## 2022-12-09 DIAGNOSIS — I2782 Chronic pulmonary embolism: Secondary | ICD-10-CM

## 2022-12-09 LAB — ECG 12-LEAD
Atrial Rate: 71 {beats}/min
IHS MUSE NARRATIVE AND IMPRESSION: NORMAL
P Axis: 62 degrees
P-R Interval: 156 ms
Q-T Interval: 398 ms
QRS Duration: 82 ms
QTC Calculation (Bezet): 432 ms
R Axis: -35 degrees
T Axis: 51 degrees
Ventricular Rate: 71 {beats}/min

## 2022-12-09 MED ORDER — ATORVASTATIN CALCIUM 40 MG PO TABS
40.0000 mg | ORAL_TABLET | Freq: Every day | ORAL | 3 refills | Status: DC
Start: 2022-12-09 — End: 2024-09-08

## 2022-12-09 NOTE — Progress Notes (Signed)
CARDIOLOGY OFFICE NOTE                                                                                                          Bill Closs, MD      DATE OF SERVICE:  12/09/2022  PATIENT:  Bill Kaiser     (DOB:  03-Apr-1966  56 y.o.  male)  PCP:  Bill Murdoch, MD          -----------------------  HISTORY OF PRESENT ILLNESS:    56 y.o. male with hx of smoking, hyperlipidemia, obesity, unprovoked DVT and PE who presents for consultation at the request of Dr. Gillermo Kaiser for cardiovascular evaluation.    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 This was complicated by right main PA pulmonary embolism as noted on CTA of the chest on presentation. Personal review and interpretation of the CTA also shows coronary calcifications. Right ventricular size was normal on CT with normal NT-proBNP and HS troponin levels.    He was started on Eliquis and has been on it since. He is fairly active and goes to the gym frequently. He denies any chest pain or shortness of breath these days. He has no cardiovascular related complaints. He quit smoking after he was diagnosed with thromboembolism and he used to smoke heavily for 20 years. He has no known family history of heart disease. His BP was elevated today and is typically normal in clinic. He does not check his BP at home. He states that generally follows a healthy diet.    ECG:  Today shows NSR with borderline LAD      PHYSICAL EXAM:   BP 153/71 (BP Site: Left arm, Patient Position: Sitting, Cuff Size: Medium)   Pulse 76   Ht 1.86 m (6' 1.23")   Wt 110.9 kg (244 lb 6.4 oz)   SpO2 97%   BMI 32.04 kg/m   Constitutional:  appears well  Head: normocephalic, atraumatic  Pulmonary:  unlabored respiration, clear to auscultation  Eyes: EOMI, anicteric  sclera  Neck: supple, normal JVP  Cardiovascular:  normal rate, regular rhythm, normal S1S2, no murmurs  Neuro/Psychiatic:  alert, oriented  Extremities: warm, RLE 1-2+ pitting peripheral edema       MEDICATIONS:      Current Outpatient Medications:     Advair Diskus 250-50 MCG/ACT Aerosol Pwdr, Breath Activated, Inhale 1 puff into the lungs 2 (two) times daily, Disp: 1 each, Rfl: 5    alpha lipoic acid 200 MG Cap, Take by mouth, Disp: , Rfl:     Ascorbic Acid (VITAMIN C) 1000 MG tablet, Take 1 tablet (1,000 mg) by mouth daily 2-3 tablets, Disp: , Rfl:     azithromycin (ZITHROMAX) 250 MG tablet, Two tabs PO day 1 and then one tab PO daily days 2-5, Disp: 6 tablet, Rfl: 0    b complex vitamins capsule, Take 1 capsule by mouth every other day, Disp: , Rfl:     Cholecalciferol (VITAMIN D3) 3000 units Tab, Take by mouth   , Disp: , Rfl:  gabapentin (NEURONTIN) 300 MG capsule, Take 1 capsule by mouth every night for 1 week, then 1 capsule by mouth twice a day for 1 week, then 1 capsule by mouth three times a day, Disp: 90 capsule, Rfl: 2    magnesium 30 MG tablet, Take 1 tablet (30 mg) by mouth 2 (two) times daily, Disp: , Rfl:     pantoprazole (PROTONIX) 40 MG tablet, Take 1 tablet (40 mg) by mouth daily (Patient taking differently: Take 20 mg by mouth daily), Disp: 30 tablet, Rfl: 11    vitamin A 1610910000 UNIT capsule, Take 1 capsule (10,000 Units) by mouth, Disp: , Rfl:     VITAMIN K PO, Take by mouth   , Disp: , Rfl:     albuterol sulfate HFA (PROVENTIL) 108 (90 Base) MCG/ACT inhaler, Inhale 2 puffs into the lungs every 4 (four) hours as needed for Wheezing, Disp: 1 each, Rfl: 5    ALPRAZolam (Xanax) 0.5 MG tablet, Take 1 tablet (0.5 mg) by mouth 2 (two) times daily as needed for Anxiety (Patient not taking: Reported on 12/09/2022), Disp: 90 tablet, Rfl: 0    apixaban (Eliquis) 5 MG, Take 1 tablet (5 mg) by mouth every 12 (twelve) hours, Disp: 180 tablet, Rfl: 0    atorvastatin (LIPITOR) 40 MG tablet, Take 1 tablet  (40 mg) by mouth daily, Disp: 90 tablet, Rfl: 3      ASSESSMENT AND PLAN:    56 y.o. male with hx of smoking, hyperlipidemia, obesity, unprovoked DVT and PE who presents for consultation at the request of Dr. Gillermo MurdochIrum Kaiser for cardiovascular evaluation.    Coronary artery calcifications  -No angina. Start atorvastatin 40 mg daily    2. Hyperlipidemia  -Lipid panel in September 2023 with total cholesterol 203, TG 193, HDL 46 and LDL 118  -Start atorvastatin 40 mg daily     3. 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 He is on Eliquis with duration of anticoagulation to be determined  -Obtain TTE    4. Shortness of breath  -Initially referred to cardiology in July 2023 for dyspnea on exertion prior to being diagnosed with PE. He does not complain of shortness of breath for the time being, but will obtain TTE to rule out any RV strain with PE    ORDERS:  Orders Placed This Encounter   Procedures    ECG 12 lead (Normal)    Transthoracic Echocardiogram (TTE)   FOLLOW UP:  Return in about 1 year (around 12/10/2023)..       _____________________________     Bill ClossAhmed Halsey Persaud, MD, Hiawatha Community HospitalFACC  Cardiologist - [facility]    The above was communicated to Dr. Gillermo MurdochIrum Kaiser    This note was generated by the Epic EMR system/ Dragon speech recognition and may contain inherent errors or omissions not intended by the user. Grammatical errors, random word insertions, deletions and pronoun errors  are occasional consequences of this technology due to software limitations. Not all errors are caught or corrected. If there are questions or concerns about the content of this note or information contained within the body of this dictation they should be addressed directly with the author for clarification

## 2022-12-10 ENCOUNTER — Ambulatory Visit (INDEPENDENT_AMBULATORY_CARE_PROVIDER_SITE_OTHER): Payer: Commercial Managed Care - HMO | Admitting: Critical Care Medicine

## 2022-12-11 ENCOUNTER — Ambulatory Visit (INDEPENDENT_AMBULATORY_CARE_PROVIDER_SITE_OTHER): Payer: Commercial Managed Care - HMO | Admitting: Family Nurse Practitioner

## 2022-12-11 ENCOUNTER — Ambulatory Visit (INDEPENDENT_AMBULATORY_CARE_PROVIDER_SITE_OTHER): Payer: 59 | Admitting: Critical Care Medicine

## 2022-12-19 ENCOUNTER — Other Ambulatory Visit (INDEPENDENT_AMBULATORY_CARE_PROVIDER_SITE_OTHER): Payer: Self-pay | Admitting: Family Medicine

## 2022-12-19 DIAGNOSIS — Z87891 Personal history of nicotine dependence: Secondary | ICD-10-CM

## 2022-12-22 ENCOUNTER — Other Ambulatory Visit (INDEPENDENT_AMBULATORY_CARE_PROVIDER_SITE_OTHER): Payer: Self-pay | Admitting: Family Medicine

## 2022-12-22 DIAGNOSIS — I2699 Other pulmonary embolism without acute cor pulmonale: Secondary | ICD-10-CM

## 2022-12-22 DIAGNOSIS — I824Z9 Acute embolism and thrombosis of unspecified deep veins of unspecified distal lower extremity: Secondary | ICD-10-CM

## 2022-12-24 ENCOUNTER — Telehealth (INDEPENDENT_AMBULATORY_CARE_PROVIDER_SITE_OTHER): Payer: Self-pay | Admitting: Family Medicine

## 2022-12-24 ENCOUNTER — Other Ambulatory Visit (INDEPENDENT_AMBULATORY_CARE_PROVIDER_SITE_OTHER): Payer: Self-pay | Admitting: Family Medicine

## 2022-12-24 ENCOUNTER — Encounter (INDEPENDENT_AMBULATORY_CARE_PROVIDER_SITE_OTHER): Payer: Self-pay | Admitting: Student in an Organized Health Care Education/Training Program

## 2022-12-24 ENCOUNTER — Ambulatory Visit
Admission: RE | Admit: 2022-12-24 | Discharge: 2022-12-24 | Disposition: A | Discharge status: Home / Self Care | Payer: 59 | Source: Ambulatory Visit | Attending: Student in an Organized Health Care Education/Training Program | Admitting: Student in an Organized Health Care Education/Training Program

## 2022-12-24 DIAGNOSIS — I2782 Chronic pulmonary embolism: Secondary | ICD-10-CM | POA: Insufficient documentation

## 2022-12-24 DIAGNOSIS — R0609 Other forms of dyspnea: Secondary | ICD-10-CM

## 2022-12-24 LAB — ECHO ADULT TTE COMPLETE
AV Area (Cont Eq VTI): 4.264
AV Area (Cont Eq VTI): 4.33
AV Mean Gradient: 4
AV Mean Gradient: 4
AV Peak Velocity: 1.32
AV Peak Velocity: 1.43
Ao Root Diameter (2D): 3.8
BP Mod LV Ejection Fraction: 61.5
IVS Diastolic Thickness (2D): 0.983
LA Dimension (2D): 3.1
LA Volume Index (BP A-L): 16
LVID diastole (2D): 4.78
LVID systole (2D): 3
MV Area (PHT): 3.122
MV E/A: 0.8
MV E/A: 0.818
MV E/e' (Average): 6.821
Mitral Valve Findings: NORMAL
Prox Ascending Aorta Diameter: 3.8
Pulmonary Valve Findings: NORMAL
RV Basal Diastolic Dimension: 3.81
RV Function: NORMAL
RV Systolic Pressure: 25
RV Systolic Pressure: 25.468
Site RA Size (AS): NORMAL
Site RV Size (AS): NORMAL
TAPSE: 2.38
Tricuspid Valve Findings: NORMAL

## 2022-12-24 NOTE — Telephone Encounter (Signed)
Patient called to request medication refill for the following medications to be sent to [facility];       - pantoprazole (PROTONIX) 40 MG tablet   - famotidine (PEPCID) 40 MG tablet

## 2022-12-24 NOTE — Telephone Encounter (Signed)
Please review and fill if appropriate. No documentation of pt taking famotidine. Last visit note shows pt takes 1/2 dose of the prescribed pantoprazole.

## 2022-12-25 ENCOUNTER — Encounter (INDEPENDENT_AMBULATORY_CARE_PROVIDER_SITE_OTHER): Payer: Self-pay

## 2022-12-25 NOTE — Telephone Encounter (Signed)
Mychart message sent to pt with this information

## 2022-12-26 ENCOUNTER — Ambulatory Visit: Payer: 59

## 2023-01-30 ENCOUNTER — Telehealth (INDEPENDENT_AMBULATORY_CARE_PROVIDER_SITE_OTHER): Payer: Self-pay

## 2023-01-30 ENCOUNTER — Telehealth (INDEPENDENT_AMBULATORY_CARE_PROVIDER_SITE_OTHER): Payer: Commercial Managed Care - HMO | Admitting: Family Nurse Practitioner

## 2023-01-30 NOTE — Telephone Encounter (Signed)
Pt joined to the video at 3.37 pm       Checked In:  Can. ChkIn:   01/30/2023 3:37 PM  01/30/2023 4:00 PM   By:  By:   Ethelene Hal     I sent a message to Lattie Haw and she agreed to see him.    When I notified Pt that Lattie Haw will see him, Pt was no longer on the video.   I called him to let him know that Lattie Haw will see him, but he said he will reschedule because his appointment was at 3.15 pm ( Pt was schedule at 3.00 pm ) and he was waiting for a long time and nobody call him, he then hung up on me.

## 2023-01-30 NOTE — Telephone Encounter (Addendum)
Pt has an appointment  (telemedicine) today at 3.00 pm but he did not answer the phone call.  LVMTCB

## 2023-02-15 ENCOUNTER — Other Ambulatory Visit (INDEPENDENT_AMBULATORY_CARE_PROVIDER_SITE_OTHER): Payer: Self-pay | Admitting: Gastroenterology

## 2023-02-20 ENCOUNTER — Ambulatory Visit (INDEPENDENT_AMBULATORY_CARE_PROVIDER_SITE_OTHER): Payer: Commercial Managed Care - HMO | Admitting: Family Nurse Practitioner

## 2023-02-22 ENCOUNTER — Emergency Department
Admission: EM | Admit: 2023-02-22 | Discharge: 2023-02-22 | Disposition: A | Discharge status: Home / Self Care | Payer: 59 | Attending: Emergency Medicine | Admitting: Emergency Medicine

## 2023-02-22 DIAGNOSIS — J029 Acute pharyngitis, unspecified: Secondary | ICD-10-CM | POA: Insufficient documentation

## 2023-02-22 DIAGNOSIS — H73892 Other specified disorders of tympanic membrane, left ear: Secondary | ICD-10-CM | POA: Insufficient documentation

## 2023-02-22 DIAGNOSIS — Z87891 Personal history of nicotine dependence: Secondary | ICD-10-CM | POA: Insufficient documentation

## 2023-02-22 DIAGNOSIS — H9202 Otalgia, left ear: Secondary | ICD-10-CM | POA: Insufficient documentation

## 2023-02-22 DIAGNOSIS — Z20822 Contact with and (suspected) exposure to covid-19: Secondary | ICD-10-CM | POA: Insufficient documentation

## 2023-02-22 DIAGNOSIS — Z9081 Acquired absence of spleen: Secondary | ICD-10-CM | POA: Insufficient documentation

## 2023-02-22 DIAGNOSIS — H6592 Unspecified nonsuppurative otitis media, left ear: Secondary | ICD-10-CM

## 2023-02-22 LAB — GROUP A STREP, RAPID ANTIGEN: Group A Strep, Rapid Antigen: NEGATIVE

## 2023-02-22 LAB — COVID-19 (SARS-COV-2) & INFLUENZA  A/B, NAA (ROCHE LIAT)
Influenza A: NOT DETECTED
Influenza B: NOT DETECTED
SARS CoV 2 Overall Result: NOT DETECTED

## 2023-02-22 MED ORDER — FLUTICASONE PROPIONATE 50 MCG/ACT NA SUSP
2.0000 | Freq: Every day | NASAL | 0 refills | Status: DC
Start: 2023-02-22 — End: 2024-09-08

## 2023-02-22 MED ORDER — AMOXICILLIN 500 MG PO CAPS
500.0000 mg | ORAL_CAPSULE | Freq: Two times a day (BID) | ORAL | 0 refills | Status: AC
Start: 2023-02-22 — End: 2023-03-04
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
## 2023-02-22 NOTE — Discharge Instructions (Addendum)
Amoxicillin twice daily for 10 days to cover for bacterial throat infection.  Flonase twice daily.  Rest.  Increase fluids.  Do not share food, drink, utensils.  Change toothbrush in 2 days.  Follow-up with PCP.  Seek immediate medical attention for new or worsening symptoms.

## 2023-03-19 ENCOUNTER — Other Ambulatory Visit (INDEPENDENT_AMBULATORY_CARE_PROVIDER_SITE_OTHER): Payer: Self-pay

## 2023-03-19 ENCOUNTER — Telehealth (INDEPENDENT_AMBULATORY_CARE_PROVIDER_SITE_OTHER): Payer: Self-pay | Admitting: Family Medicine

## 2023-03-19 DIAGNOSIS — I824Z9 Acute embolism and thrombosis of unspecified deep veins of unspecified distal lower extremity: Secondary | ICD-10-CM

## 2023-03-19 DIAGNOSIS — R079 Chest pain, unspecified: Secondary | ICD-10-CM

## 2023-03-19 DIAGNOSIS — I2699 Other pulmonary embolism without acute cor pulmonale: Secondary | ICD-10-CM

## 2023-03-19 MED ORDER — ADVAIR DISKUS 250-50 MCG/ACT IN AEPB
1.0000 | INHALATION_SPRAY | Freq: Two times a day (BID) | RESPIRATORY_TRACT | 5 refills | Status: DC
Start: 2023-03-19 — End: 2024-09-28

## 2023-03-19 MED ORDER — APIXABAN 5 MG PO TABS
5.0000 mg | ORAL_TABLET | Freq: Two times a day (BID) | ORAL | 0 refills | Status: DC
Start: 2023-03-19 — End: 2023-07-15

## 2023-03-19 NOTE — Telephone Encounter (Signed)
Order in file from 12/19/2022 is only low-dose lung CT scan.  Thank you.

## 2023-03-19 NOTE — Telephone Encounter (Signed)
Pt called - stated per Imaging order for CT should be with low dose contrast before he can schedule an apt.

## 2023-03-19 NOTE — Telephone Encounter (Signed)
Refill done for Advair.  I have only called in 1 month refill for Eliquis as per his consult notes with his pulmonologist in September plan was to revisit and see if anticoagulation can be stopped at 6 months depending upon presence of hypercoagulable factors.  Please advise him to follow-up with his pulmonologist for future refills and decision to continue.  Thank you.

## 2023-03-19 NOTE — Addendum Note (Signed)
Addended by: Jori Moll on: 03/19/2023 05:35 PM     Modules accepted: Orders

## 2023-03-19 NOTE — Telephone Encounter (Signed)
Pt called - requesting refill for 3 months  or more  for both scripts:  Eliquis 5 MG   Advair Diskus 250-50 MCG/ACT Aerosol Pwdr, Breath Activated

## 2023-03-19 NOTE — Telephone Encounter (Signed)
Last appt 12/05/22  Last refill for eliquis 12/23/22 for 60 tabs  Last refill for advair 09/25/22 for 1/5 refills

## 2023-03-20 ENCOUNTER — Encounter (INDEPENDENT_AMBULATORY_CARE_PROVIDER_SITE_OTHER): Payer: Self-pay | Admitting: Family Medicine

## 2023-03-20 NOTE — Telephone Encounter (Signed)
New order placed and sent via mychart. Thank you so much for the follow up.

## 2023-03-20 NOTE — Addendum Note (Signed)
Addended by: Jori Moll on: 03/20/2023 05:39 PM     Modules accepted: Orders

## 2023-03-20 NOTE — Telephone Encounter (Signed)
Spoke with pt, he will reach out to pulmonary for next refill if they want him to continue. He also wants Dr. Doneta Public to know that he called to schedule his CT lung low dose and when he answered their questions (yes to experiencing some sob and some pain in the left lung area), they recommended that the CT be reordered with contrast. He is asking for the order to be sent to his mychart once reordered. Thank you

## 2023-03-20 NOTE — Telephone Encounter (Signed)
Called pt -lvm and informed.

## 2023-04-04 ENCOUNTER — Ambulatory Visit: Payer: 59

## 2023-05-13 ENCOUNTER — Other Ambulatory Visit (INDEPENDENT_AMBULATORY_CARE_PROVIDER_SITE_OTHER): Payer: Self-pay | Admitting: Family Medicine

## 2023-05-13 ENCOUNTER — Ambulatory Visit: Payer: 59 | Attending: Family Medicine

## 2023-05-13 DIAGNOSIS — I824Z9 Acute embolism and thrombosis of unspecified deep veins of unspecified distal lower extremity: Secondary | ICD-10-CM

## 2023-05-13 DIAGNOSIS — Z87891 Personal history of nicotine dependence: Secondary | ICD-10-CM | POA: Insufficient documentation

## 2023-05-13 DIAGNOSIS — Z122 Encounter for screening for malignant neoplasm of respiratory organs: Secondary | ICD-10-CM

## 2023-05-13 DIAGNOSIS — I2699 Other pulmonary embolism without acute cor pulmonale: Secondary | ICD-10-CM

## 2023-05-13 DIAGNOSIS — R079 Chest pain, unspecified: Secondary | ICD-10-CM | POA: Insufficient documentation

## 2023-05-13 NOTE — Addendum Note (Signed)
Addended by: Gillermo Murdoch on: 05/13/2023 08:42 AM     Modules accepted: Orders

## 2023-05-16 ENCOUNTER — Telehealth (INDEPENDENT_AMBULATORY_CARE_PROVIDER_SITE_OTHER): Payer: Self-pay | Admitting: Family Medicine

## 2023-05-16 NOTE — Telephone Encounter (Signed)
Test results have been reviewed and MyChart message sent with the patient and advised if he has additional questions he can set up a follow-up which can be a video appointment to discuss further.  Thank you.

## 2023-05-16 NOTE — Telephone Encounter (Signed)
Pt called regarding CT result. Writer advised pt PCP will send message or return call when results has been reviewed.

## 2023-05-31 ENCOUNTER — Emergency Department
Admission: EM | Admit: 2023-05-31 | Discharge: 2023-05-31 | Disposition: A | Discharge status: Home / Self Care | Payer: 59 | Attending: Emergency Medicine | Admitting: Emergency Medicine

## 2023-05-31 DIAGNOSIS — L237 Allergic contact dermatitis due to plants, except food: Secondary | ICD-10-CM

## 2023-05-31 DIAGNOSIS — Z7901 Long term (current) use of anticoagulants: Secondary | ICD-10-CM | POA: Insufficient documentation

## 2023-05-31 DIAGNOSIS — L255 Unspecified contact dermatitis due to plants, except food: Secondary | ICD-10-CM | POA: Insufficient documentation

## 2023-05-31 LAB — COVID-19 (SARS-COV-2) & INFLUENZA  A/B, NAA (ROCHE LIAT)
Influenza A: NOT DETECTED
Influenza B: NOT DETECTED
SARS-CoV-2 Overall Result: NOT DETECTED

## 2023-05-31 MED ORDER — PREDNISONE 20 MG PO TABS
60.0000 mg | ORAL_TABLET | Freq: Every day | ORAL | 0 refills | Status: AC
Start: 2023-06-02 — End: 2023-06-06

## 2023-05-31 MED ORDER — PREDNISONE 20 MG PO TABS
60.0000 mg | ORAL_TABLET | Freq: Once | ORAL | Status: AC
Start: 2023-05-31 — End: 2023-05-31
  Administered 2023-05-31: 60 mg via ORAL
  Filled 2023-05-31: qty 3

## 2023-05-31 MED ORDER — HYDROCORTISONE 2.5 % EX CREA
TOPICAL_CREAM | Freq: Two times a day (BID) | CUTANEOUS | 0 refills | Status: DC
Start: 2023-05-31 — End: 2024-09-08
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
## 2023-05-31 NOTE — EDIE (Signed)
[facility]?NOTIFICATION?05/31/2023 13:57?Bill Kaiser, Bill Kaiser?MRN: [id number]    Criteria Met      5 ED Visits in 12 Months    Security and Safety  No Security Events were found.  ED Care Guidelines  There are currently no ED Care Guidelines for this patient. Please check your facility's medical records system.        Prescription Drug Data  No Prescription Drug Data was found.    E.D. Visit Count (12 mo.)  Facility Visits   [place] [facility]: [facility] 3   [facility] 2   Total 5   Note: Visits indicate total known visits.     Recent Emergency Department Visit Summary  Date Facility [facility] Type Diagnoses or Chief Complaint    May 31, 2023  [place] [facility]: [facility].  [place]  Emergency      Rash,headache,sob      Feb 22, 2023  [place] [facility]: [facility]  [place]  Emergency      Acquired absence of spleen      Acute pharyngitis, unspecified      Unspecified nonsuppurative otitis media, left ear      Otalgia, left ear      Flu like symptoms      Sore throat, Cough, Ear pain      Oct 02, 2022  [facility] [facility]  [place]  Emergency      Acute embolism and thrombosis of unspecified deep veins of right proximal lower extremity      Leg Pain      Blood Clots      triage-      Aug 28, 2022  [facility]  [place]  Emergency      Hematemesis      Rectal Bleeding      TRIAGE- rectal bleeding, bloody emesis, on Eliquis      TRIAGE      Aug 09, 2022  [place] [facility]: [facility].  [place]  Emergency      Other pulmonary embolism without acute cor pulmonale      Leg Pain      leg swelling        Recent Inpatient Visit Summary  No Recent Inpatient Visits were found.  Care Team  Provider Specialty Phone Fax Service Dates   Alene Mires Case Manager/Care Coordinator [phone] Current    Denita Lung Case Manager/Care Coordinator [phone]  Current      [facility]  This patient has registered at the  [facility] Emergency Room: [facility] Emergency Department  For more information visit: [email or web address]     PLEASE NOTE:     1.   Any care recommendations and other clinical information are provided as guidelines or for historical purposes only, and providers should exercise their own clinical judgment when providing care.    2.   You may only use this information for purposes of treatment, payment or health care operations activities, and subject to the limitations of applicable [facility] Policies.    3.   You should consult directly with the organization that provided a care guideline or other clinical history with any questions about additional information or accuracy or completeness of information provided.    ? 2024 [facility] - [email or web address]

## 2023-05-31 NOTE — Discharge Instructions (Signed)
Please follow up with your primary care doctor in 1-2 days regarding your visit to the emergency department. If you develop any worsening of symptoms please contact your physician or return to the ER for reevaluation!

## 2023-06-12 ENCOUNTER — Encounter (INDEPENDENT_AMBULATORY_CARE_PROVIDER_SITE_OTHER): Payer: Self-pay | Admitting: Student in an Organized Health Care Education/Training Program

## 2023-06-12 ENCOUNTER — Ambulatory Visit (INDEPENDENT_AMBULATORY_CARE_PROVIDER_SITE_OTHER): Payer: 59 | Admitting: Student in an Organized Health Care Education/Training Program

## 2023-06-12 VITALS — BP 116/72 | HR 65 | Wt 241.1 lb

## 2023-06-12 DIAGNOSIS — I2510 Atherosclerotic heart disease of native coronary artery without angina pectoris: Secondary | ICD-10-CM

## 2023-06-12 DIAGNOSIS — R072 Precordial pain: Secondary | ICD-10-CM

## 2023-06-12 DIAGNOSIS — R0609 Other forms of dyspnea: Secondary | ICD-10-CM

## 2023-06-12 DIAGNOSIS — I2782 Chronic pulmonary embolism: Secondary | ICD-10-CM

## 2023-06-12 LAB — ECG 12-LEAD
Atrial Rate: 66 {beats}/min
P Axis: 59 degrees
P-R Interval: 160 ms
Q-T Interval: 416 ms
QRS Duration: 76 ms
QTC Calculation (Bezet): 436 ms
R Axis: -34 degrees
T Axis: 71 degrees
Ventricular Rate: 66 {beats}/min

## 2023-06-12 NOTE — Progress Notes (Signed)
CARDIOLOGY OFFICE NOTE                                                                                                          Hughie Closs, MD      DATE OF SERVICE:  06/12/2023  PATIENT:  Bill Kaiser     (DOB:  [birth year]  57 y.o.  male)  PCP:  Gillermo Murdoch, MD          -----------------------  HISTORY OF PRESENT ILLNESS:    57 y.o. male with hx of smoking, hyperlipidemia, obesity, unprovoked DVT and PE who presents for follow up.    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 This was complicated by right main PA pulmonary embolism as noted on CTA of the chest on presentation. Personal review and interpretation of the CTA also shows coronary calcifications. Right ventricular size was normal on CT with normal NT-proBNP and HS troponin levels.    He was started on Eliquis and has been on it since. He is fairly active and goes to the gym frequently.  However, he complained of an occasional chest tightness sensation when he feels too stressed or overwhelmed.  He quit smoking after he was diagnosed with thromboembolism and he used to smoke heavily for 20 years. He has no known family history of heart disease. His BP was elevated today and is typically normal in clinic. He does not check his BP at home. He states that generally follows a healthy diet.    TTE in December 2023 with LVEF 62% with grade 1 diastolic dysfunction normal right ventricle systolic function and no significant valve dysfunction.  Aorta was normal size for his BSA.    ECG:  Today shows NSR with borderline LAD      PHYSICAL EXAM:   BP 116/72 (BP Site: Left arm, Patient Position: Sitting, Cuff Size: Large)   Pulse 65   Wt 109.4 kg (241 lb 1.6 oz)   BMI 31.81 kg/m   Constitutional:  appears well  Head: normocephalic,  atraumatic  Pulmonary:  unlabored respiration, clear to auscultation  Eyes: EOMI, anicteric sclera  Neck: supple, normal JVP  Cardiovascular:  normal rate, regular rhythm, normal S1S2, no murmurs  Neuro/Psychiatic:  alert, oriented      MEDICATIONS:      Current Outpatient Medications:     Advair Diskus 250-50 MCG/ACT Aerosol Pwdr, Breath Activated, Inhale 1 puff into the lungs 2 (two) times daily, Disp: 1 each, Rfl: 5    alpha lipoic acid 200 MG Cap, Take by mouth, Disp: , Rfl:     Ascorbic Acid (VITAMIN C) 1000 MG tablet, Take 1 tablet (1,000 mg) by mouth daily 2-3 tablets, Disp: , Rfl:     atorvastatin (LIPITOR) 40 MG tablet, Take 1 tablet (40 mg) by mouth daily, Disp: 90 tablet, Rfl: 3    b complex vitamins capsule, Take 1 capsule by mouth every other day, Disp: , Rfl:     Cholecalciferol (VITAMIN D3) 3000 units Tab, Take by mouth   , Disp: , Rfl:  gabapentin (NEURONTIN) 300 MG capsule, Take 1 capsule by mouth every night for 1 week, then 1 capsule by mouth twice a day for 1 week, then 1 capsule by mouth three times a day, Disp: 90 capsule, Rfl: 2    hydrocortisone 2.5 % cream, Apply topically 2 (two) times daily, Disp: 20 g, Rfl: 0    magnesium 30 MG tablet, Take 1 tablet (30 mg) by mouth 2 (two) times daily, Disp: , Rfl:     pantoprazole (PROTONIX) 40 MG tablet, TAKE 1 TABLET BY MOUTH EVERY DAY, Disp: 30 tablet, Rfl: 11    vitamin A 60109 UNIT capsule, Take 1 capsule (10,000 Units) by mouth, Disp: , Rfl:     VITAMIN K PO, Take by mouth   , Disp: , Rfl:     albuterol sulfate HFA (PROVENTIL) 108 (90 Base) MCG/ACT inhaler, Inhale 2 puffs into the lungs every 4 (four) hours as needed for Wheezing, Disp: 1 each, Rfl: 5    apixaban (Eliquis) 5 MG, Take 1 tablet (5 mg) by mouth every 12 (twelve) hours, Disp: 60 tablet, Rfl: 0    fluticasone (FLONASE) 50 MCG/ACT nasal spray, 2 sprays by Nasal route daily for 7 days, Disp: 9.9 mL, Rfl: 0      ASSESSMENT AND PLAN:    57 y.o. male with hx of smoking, hyperlipidemia,  obesity, unprovoked DVT and PE who presents for consultation at the request of Dr. Gillermo Murdoch for cardiovascular evaluation.    Coronary artery calcifications  -Noted on CT of the chest in 2023 as well as lung cancer CT in 2024.  Continue atorvastatin 40 mg daily.  Will forego aspirin therapy since he is on Eliquis    2.  Chest tightness  -Appears to have occasional exertional chest tightness sensation.  In context of coronary calcifications, will risk stratify with an exercise treadmill stress test    3. Hyperlipidemia  -Lipid panel in September 2023 with total cholesterol 203, TG 193, HDL 46 and LDL 118  -Continue atorvastatin 40 mg daily.  Lipid panel will be checked this year with Dr. Hoy Morn    4. 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 He is on Eliquis.  Normal right ventricular size and systolic function on TTE in December 2023    ORDERS:  Orders Placed This Encounter   Procedures    Exercise Stress Test (Office Treadmill Test)    ECG 12 lead (Normal)   FOLLOW UP:  Return in about 1 year (around 06/11/2024)..       _____________________________     Hughie Closs, MD, [facility]  Cardiologist - [facility]    This visit encompassed the longitudinal relationship with the patient for the treatment of coronary artery disease    This note was generated by the Epic EMR system/ Dragon speech recognition and may contain inherent errors or omissions not intended by the user. Grammatical errors, random word insertions, deletions and pronoun errors  are occasional consequences of this technology due to software limitations. Not all errors are caught or corrected. If there are questions or concerns about the content of this note or information contained within the body of this dictation they should be addressed directly with the author for clarification

## 2023-06-19 ENCOUNTER — Ambulatory Visit (INDEPENDENT_AMBULATORY_CARE_PROVIDER_SITE_OTHER): Payer: 59

## 2023-06-19 ENCOUNTER — Encounter (INDEPENDENT_AMBULATORY_CARE_PROVIDER_SITE_OTHER): Payer: Self-pay

## 2023-06-19 DIAGNOSIS — R072 Precordial pain: Secondary | ICD-10-CM

## 2023-06-19 LAB — CARDIAC STRESS TEST

## 2023-06-20 ENCOUNTER — Encounter (INDEPENDENT_AMBULATORY_CARE_PROVIDER_SITE_OTHER): Payer: Self-pay | Admitting: Student in an Organized Health Care Education/Training Program

## 2023-07-15 ENCOUNTER — Other Ambulatory Visit: Payer: Self-pay | Admitting: Student in an Organized Health Care Education/Training Program

## 2023-07-15 DIAGNOSIS — I824Z9 Acute embolism and thrombosis of unspecified deep veins of unspecified distal lower extremity: Secondary | ICD-10-CM

## 2023-07-15 DIAGNOSIS — I2699 Other pulmonary embolism without acute cor pulmonale: Secondary | ICD-10-CM

## 2023-07-15 NOTE — Telephone Encounter (Signed)
Name, strength, directions of requested refill(s):  apixaban (Eliquis) 5 MG     How much medication is remaining: Patient is out of medication.    Pharmacy to send refill to or patient to pick up rx from office (mark requested pharmacy in BOLD):      [id number] - [place], [place] - [address]  [address]  [facility] [place] [address]  Phone: [phone] Fax: [phone]        Please mark "X" next to the preferred call back number:    Mobile: [phone] (mobile) X   Home: [phone] (home)    Work: @WORKPHONE @        Medication refill request, see above. Thank you   Patient has been informed that medication refill requests should be called in up to one week prior to running out of medication.    Additional Notes:    Next Visit: MM/DD/YYYY

## 2023-07-15 NOTE — Telephone Encounter (Signed)
Rick please route to appropriate MD since Dr Richarda Overlie PTO. TY    Prescription Refill Checklist    Completed Process Reviewed Notes   [x]  Verified med on pt chart.    [x]  2.  Reviewed patient allergies    [x]  3.  Review last encounters since OV 06/12/23   [x]  4.  Determine if pt has f/u as instructed or needs OV    [x]  5.  If OV required, pend 30 day supply of medication    [x]  6.  Review labs are up to date. 08/2022   []  7. Filled by RN 90 day x 0 May only be done 1 x yearly   DATE:         Reviewed - em

## 2023-07-16 MED ORDER — APIXABAN 5 MG PO TABS
5.0000 mg | ORAL_TABLET | Freq: Two times a day (BID) | ORAL | 3 refills | Status: DC
Start: 2023-07-16 — End: 2024-07-16

## 2023-07-29 ENCOUNTER — Encounter (INDEPENDENT_AMBULATORY_CARE_PROVIDER_SITE_OTHER): Payer: Self-pay

## 2023-08-29 ENCOUNTER — Encounter (INDEPENDENT_AMBULATORY_CARE_PROVIDER_SITE_OTHER): Payer: Self-pay

## 2023-08-29 ENCOUNTER — Telehealth (INDEPENDENT_AMBULATORY_CARE_PROVIDER_SITE_OTHER): Payer: Self-pay | Admitting: Gastroenterology

## 2023-08-29 NOTE — Telephone Encounter (Signed)
LVM reminder informing pt that his F/U appt with Dr. Roselyn Bering on 10/27/23 was cancelled since the appt was scheduled on his procedure day. Pt can be added to Lisa's schedule or an APP at the clinic for his stomach pains symptoms. Waiting for his call.

## 2023-09-12 ENCOUNTER — Other Ambulatory Visit (INDEPENDENT_AMBULATORY_CARE_PROVIDER_SITE_OTHER): Payer: Self-pay | Admitting: Family Medicine

## 2023-10-27 ENCOUNTER — Telehealth (INDEPENDENT_AMBULATORY_CARE_PROVIDER_SITE_OTHER): Payer: 59 | Admitting: Gastroenterology

## 2023-10-30 NOTE — Progress Notes (Deleted)
 [facility]  [facility]  [facility]  [address], [address]  [place], [place] [address]  T: [phone] F: [phone]      Bill Kaiser   Age/Gender: 57 y.o. male   DOB: 1966-09-05   [name]

## 2023-11-03 ENCOUNTER — Ambulatory Visit: Payer: 59 | Admitting: Physician Assistant

## 2023-11-12 NOTE — Progress Notes (Signed)
 Gastroenterology    Mancel Bale [place], FNP-BC  [address]  [place], [place].  [address]  Phone: [phone]  Fax: [phone]    Reason for Consultation:   Dysphagia  Assessment:   57 year old male with history of intermittent dysphagia

## 2023-11-13 ENCOUNTER — Encounter (INDEPENDENT_AMBULATORY_CARE_PROVIDER_SITE_OTHER): Payer: Self-pay | Admitting: Family Nurse Practitioner

## 2023-11-13 ENCOUNTER — Telehealth (INDEPENDENT_AMBULATORY_CARE_PROVIDER_SITE_OTHER): Payer: 59 | Admitting: Family Nurse Practitioner

## 2023-11-13 VITALS — Ht 73.0 in | Wt 240.0 lb

## 2023-11-13 DIAGNOSIS — K648 Other hemorrhoids: Secondary | ICD-10-CM

## 2023-11-13 DIAGNOSIS — R1319 Other dysphagia: Secondary | ICD-10-CM

## 2023-11-13 DIAGNOSIS — R197 Diarrhea, unspecified: Secondary | ICD-10-CM

## 2023-11-13 DIAGNOSIS — Z860101 Personal history of adenomatous and serrated colon polyps: Secondary | ICD-10-CM

## 2023-11-13 DIAGNOSIS — R195 Other fecal abnormalities: Secondary | ICD-10-CM

## 2023-11-13 DIAGNOSIS — Z860100 Personal history of colon polyps, unspecified: Secondary | ICD-10-CM

## 2023-11-13 NOTE — Patient Instructions (Signed)
 Hello -    I would recommend that you do not take as much pantoprazole as you are currently taking. You should take 40 mg of pantoprazole twice daily.     You can add pepcid and tums as well for breakthrough symptoms.    I would recommend a swallow study a

## 2024-02-03 ENCOUNTER — Telehealth (INDEPENDENT_AMBULATORY_CARE_PROVIDER_SITE_OTHER): Payer: Self-pay

## 2024-02-03 DIAGNOSIS — J4530 Mild persistent asthma, uncomplicated: Secondary | ICD-10-CM

## 2024-02-03 MED ORDER — ALBUTEROL SULFATE HFA 108 (90 BASE) MCG/ACT IN AERS: 2.0000 | INHALATION_SPRAY | RESPIRATORY_TRACT | 0 refills | Status: DC | PRN

## 2024-02-03 NOTE — Telephone Encounter (Signed)
Please assist in scheduling. Last seen 09/20/22.    Thank you!

## 2024-02-03 NOTE — Addendum Note (Signed)
Addended by: Massie Bougie on: 02/03/2024 03:08 PM     Modules accepted: Orders

## 2024-02-03 NOTE — Telephone Encounter (Signed)
 Name, strength, directions of requested refill(s):albuterol  sulfate HFA (PROVENTIL ) 108 (90 Base) MCG/ACT inhaler       How much medication is remaining:     Pharmacy to send refill to or patient to pick up rx from office (mark requested pharmacy in BOLD):      [id number] - [place], [place] - [address]  [address]  [facility] [place] [address]  Phone: [phone] Fax: [phone]        Please mark X next to the preferred call back number:    Mobile: [phone] (mobile)    Home: [phone] (home)    Work: @WORKPHONE @        Medication refill request, see above. Thank you   Patient has been informed that medication refill requests should be called in up to one week prior to running out of medication.    Additional Notes:    Next Visit: MM/DD/YYYY

## 2024-02-14 ENCOUNTER — Other Ambulatory Visit (INDEPENDENT_AMBULATORY_CARE_PROVIDER_SITE_OTHER): Payer: Self-pay | Admitting: Gastroenterology

## 2024-03-17 ENCOUNTER — Emergency Department

## 2024-03-17 ENCOUNTER — Emergency Department
Admission: EM | Admit: 2024-03-17 | Discharge: 2024-03-17 | Disposition: A | Discharge status: Home / Self Care | Attending: Emergency Medicine | Admitting: Emergency Medicine

## 2024-03-17 DIAGNOSIS — U071 COVID-19: Secondary | ICD-10-CM | POA: Insufficient documentation

## 2024-03-17 DIAGNOSIS — F17210 Nicotine dependence, cigarettes, uncomplicated: Secondary | ICD-10-CM | POA: Insufficient documentation

## 2024-03-17 LAB — COVID-19 (SARS-COV-2) & INFLUENZA  A/B, NAA (ROCHE LIAT)
Influenza A RNA: NOT DETECTED
Influenza B RNA: NOT DETECTED
SARS-CoV-2 (COVID-19) RNA: DETECTED — AB

## 2024-03-17 MED ORDER — BENZONATATE 100 MG PO CAPS
100.0000 mg | ORAL_CAPSULE | Freq: Three times a day (TID) | ORAL | 0 refills | Status: DC | PRN
Start: 2024-03-17 — End: 2024-07-16

## 2024-03-17 NOTE — Discharge Instructions (Signed)
 For pain and/or fever, you can take  acetaminophen 1000 mg every 6 hours as needed.  You may take Tessalon Perles as needed for cough as well as over-the-counter cough syrup as needed.  Follow-up with primary care doctor in 2 to 3 days as needed.    As we discussed, symptoms can change and get worse. Return to the ED for worsening cough, chest pain, shortness of breath, fevers, nausea vomiting, worsening symptoms, or any other concerns
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
## 2024-07-06 ENCOUNTER — Other Ambulatory Visit (INDEPENDENT_AMBULATORY_CARE_PROVIDER_SITE_OTHER): Payer: Self-pay | Admitting: Family Nurse Practitioner

## 2024-07-08 ENCOUNTER — Encounter (INDEPENDENT_AMBULATORY_CARE_PROVIDER_SITE_OTHER): Payer: Self-pay

## 2024-07-09 ENCOUNTER — Encounter (INDEPENDENT_AMBULATORY_CARE_PROVIDER_SITE_OTHER): Payer: Self-pay | Admitting: Family Medicine

## 2024-07-12 ENCOUNTER — Ambulatory Visit (INDEPENDENT_AMBULATORY_CARE_PROVIDER_SITE_OTHER)

## 2024-07-12 ENCOUNTER — Encounter (INDEPENDENT_AMBULATORY_CARE_PROVIDER_SITE_OTHER): Payer: Self-pay
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
## 2024-07-13 ENCOUNTER — Encounter: Payer: Self-pay | Admitting: Family Medicine

## 2024-07-13 NOTE — Progress Notes (Signed)
 [facility] Perioperative Nutrition Note:    Bill Kaiser     MRN: [id number]    Encounter Date: 07/13/2024  Referral Source: Screen    Nutrition Summary   Bill Kaiser is a 58 y.o. male.     Nutritionally, pt states appetite doing well and no issues with n/v/c/d. Pt states wt typically fluctuates 10# between 220-230#, prefers to be around 230# given height. Pt states some intermitting difficulty chewing but already taking steps to alter consistency to help with difficulties.    Diet History / Recall: Breakfast- water, amino acids, coconut yogurt, smoothie, granola; Snack- jerky/small sandwich; Lunch- sometimes skips or a snack mix; Dinner: chicken, vegetables, quinoa.     Nutrition Interventions / Recommendations   Maintain current body weight  Consume ONS to help meet nutrient needs if ongoing poor PO intake and/or weight loss  Minimize skipping meals    Handouts Provided:  Outpatient Perioperative Dietitian Recommendations    Nutrition Diagnosis     No nutrition diagnosis at this time    Nutrition Monitoring and Evaluation   Goals:  Weight: with goal of preventing significant unintentional weight changes  Oral intake: with goal of consuming >75% of estimated nutrition needs  Labs: with goal of electrolytes within normal limits and blood glucose 80-150 mg/dL    Nutrition Risk: none    Follow Up: Provided RD contact information for nutrition questions/concerns.     Nutrition Assessment   Medical Diagnosis: EGD    Problem List[1]    Food Allergies: Allergies[2]    Past Medical History:  has a past medical history of Anxiety disorder, Arthritis, Asthma, Claustrophobia, Deep venous thrombosis of distal lower extremity (CMS/HCC) (2023), Depression, Diarrhea, Disease of lung (2023), DOE (dyspnea on exertion) (07/25/2022), Dysphagia, Ear, nose and throat disorder, Gastric ulceration, Gastroesophageal reflux disease, H/O colonoscopy with polypectomy, H/O hernia repair, Head trauma (2021), Headache, Hypoglycemia,  Low back pain, Neck pain, Neuromyopathy (CMS/HCC), Pneumonia, and PTSD (post-traumatic stress disorder).    Past Surgical History:  has a past surgical history that includes Splenectomy, total; neck injection; EGD, COLONOSCOPY (N/A, 05/13/2018); EGD, BIOPSY (N/A, 08/11/2018); Hernia repair; EGD, COLONOSCOPY (N/A, 02/05/2022); and Wisdom tooth extraction.    Current Medications:  Current Outpatient Medications   Medication Instructions    Advair  Diskus 250-50 MCG/ACT Aerosol Pwdr, Breath Activated 1 puff, Inhalation, 2 times daily    albuterol  sulfate HFA (PROVENTIL ) 108 (90 Base) MCG/ACT inhaler 2 puffs, Inhalation, Every 4 hours PRN    alpha lipoic acid 200 MG Cap Take by mouth    apixaban  (ELIQUIS ) 5 mg, Oral, Every 12 hours    atorvastatin  (LIPITOR) 40 mg, Oral, Daily    b complex vitamins capsule 1 capsule, Every other day    benzonatate  (TESSALON ) 100 mg, Oral, 3 times daily PRN    Cholecalciferol (VITAMIN D3) 3000 units Tab Take by mouth        fluticasone  (FLONASE ) 50 MCG/ACT nasal spray 2 sprays, Nasal, Daily    gabapentin  (NEURONTIN ) 300 MG capsule Take 1 capsule by mouth every night for 1 week, then 1 capsule by mouth twice a day for 1 week, then 1 capsule by mouth three times a day    hydrocortisone  2.5 % cream Topical, 2 times daily    magnesium 30 mg, 2 times daily    pantoprazole  (PROTONIX ) 40 mg, Oral, Daily    vitamin A 10,000 Units    vitamin C 1,000 mg, Daily    VITAMIN K PO Take by mouth  Lab Results   Component Value Date/Time    NA 135 09/25/2022 09:15 AM    CL 101 09/25/2022 09:15 AM    K 4.9 09/25/2022 09:15 AM    CO2 28 09/25/2022 09:15 AM    BUN 13.0 09/25/2022 09:15 AM    CREAT 0.9 09/25/2022 09:15 AM    GLU 88 09/25/2022 09:15 AM    CA 9.5 09/25/2022 09:15 AM    EGFR >60.0 09/25/2022 09:15 AM    EGFR >60.0 11/05/2021 09:57 AM    EGFR * 11/06/2009 02:30 PM    WBC 7.05 09/25/2022 09:15 AM    WBC 9.36 11/06/2009 02:30 PM    HCT 46.9 09/25/2022 09:15 AM    TRIG 193 (H) 09/25/2022 09:15  AM    LIP 44 08/28/2022 11:48 AM    HGBA1C 4.9 07/25/2022 12:01 PM     Lab Results   Component Value Date/Time    BILITOTAL 0.6 09/25/2022 09:15 AM    ALKPHOS 56 09/25/2022 09:15 AM    AST 31 09/25/2022 09:15 AM    ALT 32 09/25/2022 09:15 AM    ALB 4.2 09/25/2022 09:15 AM     Anthropometrics:  Wt Readings from Last 20 Encounters:   07/12/24 101.6 kg (224 lb)   03/17/24 105.2 kg (232 lb)   11/13/23 108.9 kg (240 lb)   06/12/23 109.4 kg (241 lb 1.6 oz)   05/31/23 108 kg (238 lb)   02/22/23 110.9 kg (244 lb 7.8 oz)   12/09/22 110.9 kg (244 lb 6.4 oz)   10/02/22 104.3 kg (230 lb)   09/20/22 110.2 kg (243 lb)   09/20/22 110.2 kg (243 lb)   08/28/22 104.3 kg (230 lb)   08/23/22 107.5 kg (236 lb 15.9 oz)   08/09/22 107.5 kg (237 lb)   07/25/22 109.9 kg (242 lb 3.2 oz)   06/04/22 103.1 kg (227 lb 4.7 oz)   02/05/22 103.4 kg (228 lb)   11/05/21 104.3 kg (230 lb)   10/24/21 102.3 kg (225 lb 8 oz)   08/15/21 103.9 kg (229 lb)   07/18/21 104.7 kg (230 lb 12.8 oz)      Ht Readings from Last 1 Encounters:   07/12/24 1.905 m (6' 3)     BMI Readings from Last 1 Encounters:   07/12/24 28.00 kg/m     Usual Body Weight: 220-230#     Estimated Nutrition Needs: 101.6kg CBW  Calories:  2040-2540 kcal/day (20-25 kcal/kg)   Protein: 102-132 gm/day (1-1.3 gm/kg)   Fluid: 2000-2500 mL/day (1 mL/kcal)   _______________________________________________________________________    Marlyn RAMAN. Tobie MS, RD, CNSC  Outpatient Dietitian         [1]   Patient Active Problem List  Diagnosis    Recurrent major depressive disorder    PTSD (post-traumatic stress disorder)    Gastroesophageal reflux disease without esophagitis    Osteoarthritis of lumbar spine    Mass of subcutaneous tissue of back    Pigmented skin lesions    Cervical radiculopathy    Former smoker    Screening for colorectal cancer    H/O asplenia    Class 1 obesity without serious comorbidity with body mass index (BMI) of 30.0 to 30.9 in adult    Attention and concentration deficit     Need for vaccination    Encounter for prostate cancer screening    DOE (dyspnea on exertion)    Other fatigue    Acute deep vein thrombosis (DVT) of distal vein of lower  extremity, unspecified laterality (CMS/HCC)    Other acute pulmonary embolism without acute cor pulmonale (CMS/HCC)    Annual physical exam    Elevated blood pressure reading in office without diagnosis of hypertension    Mild persistent asthma without complication    Eosinophilic asthma    Acute non-recurrent sinusitis, unspecified location   [2]   Allergies  Allergen Reactions    Penicillins      Patient states mother told him he is allergic to penicillin.  He tolerates amoxicillin .

## 2024-07-15 DIAGNOSIS — E782 Mixed hyperlipidemia: Secondary | ICD-10-CM | POA: Insufficient documentation

## 2024-07-15 DIAGNOSIS — F331 Major depressive disorder, recurrent, moderate: Secondary | ICD-10-CM | POA: Insufficient documentation

## 2024-07-15 DIAGNOSIS — F332 Major depressive disorder, recurrent severe without psychotic features: Secondary | ICD-10-CM | POA: Insufficient documentation

## 2024-07-16 ENCOUNTER — Encounter (INDEPENDENT_AMBULATORY_CARE_PROVIDER_SITE_OTHER): Payer: Self-pay | Admitting: Family Medicine

## 2024-07-16 ENCOUNTER — Telehealth (INDEPENDENT_AMBULATORY_CARE_PROVIDER_SITE_OTHER): Admitting: Family Medicine

## 2024-07-16 DIAGNOSIS — J4530 Mild persistent asthma, uncomplicated: Secondary | ICD-10-CM

## 2024-07-16 DIAGNOSIS — F331 Major depressive disorder, recurrent, moderate: Secondary | ICD-10-CM

## 2024-07-16 DIAGNOSIS — E782 Mixed hyperlipidemia: Secondary | ICD-10-CM

## 2024-07-16 DIAGNOSIS — K219 Gastro-esophageal reflux disease without esophagitis: Secondary | ICD-10-CM

## 2024-07-16 DIAGNOSIS — Z79899 Other long term (current) drug therapy: Secondary | ICD-10-CM

## 2024-07-16 DIAGNOSIS — F4310 Post-traumatic stress disorder, unspecified: Secondary | ICD-10-CM

## 2024-07-16 DIAGNOSIS — Z87891 Personal history of nicotine dependence: Secondary | ICD-10-CM

## 2024-07-16 DIAGNOSIS — G4700 Insomnia, unspecified: Secondary | ICD-10-CM | POA: Insufficient documentation

## 2024-07-16 DIAGNOSIS — Z125 Encounter for screening for malignant neoplasm of prostate: Secondary | ICD-10-CM

## 2024-07-16 DIAGNOSIS — F332 Major depressive disorder, recurrent severe without psychotic features: Secondary | ICD-10-CM

## 2024-07-16 DIAGNOSIS — I2782 Chronic pulmonary embolism: Secondary | ICD-10-CM

## 2024-07-16 DIAGNOSIS — M5412 Radiculopathy, cervical region: Secondary | ICD-10-CM

## 2024-07-16 NOTE — Assessment & Plan Note (Signed)
 Remain on anticoagulation for 5 months and took himself off of anticoagulation and defers hypercoagulable workup or referral to hematology for now.

## 2024-07-16 NOTE — Assessment & Plan Note (Signed)
 Reemphasized the importance of sleep hygiene.  Discussed could consider SSRI/SNRI that may help with both underlying mood disorder as well as insomnia but deferred for now.

## 2024-07-16 NOTE — Assessment & Plan Note (Signed)
 Not on pharmacotherapy and plans on following up with therapist and on a waiting list.

## 2024-07-16 NOTE — Progress Notes (Signed)
 [facility]   OFFICE VISIT            Telehealth:  The Patient has given verbal consent for delivery of health care via telehealth.   The patient is located at Home in [place]   The encounter provider is located at their Home in [place]   Epic Video Client was utilized for Real Time/Synchronous Telehealth.   The time spent in medical discussion during this visit was 25 minutes.        HPI     Chief Complaint   Patient presents with    Follow-up    Insomnia      Video visit conducted by patient request for follow-up.  He last saw me in December 2023 for sinus infection with symptoms.  Last labs done with us  in September notable for LDL 118.  He was diagnosed with pulmonary embolism in August 2023, took himself off of Eliquis  at 5 months and did not follow-up with hematology.  Saw cardiologist Dr. Garner in December and more recently in June last year.  Had stress test done 06/19/2023 and echocardiogram done 12/24/2022.  Followed up with [facility], saw nurse practitioner [facility] in November 2020 for but then switched over to [facility] and seen by nurse practitioner earlier July 11 and currently scheduled for EGD July 24 with Dr. Tressia.  He does have orders for blood work to include CBC, CMP and lipase that he has been advised to do at his earliest aching cholesterol medication on his own as he is exercising regularly for the past 5 months.  Following up with pulmonologist, using Advair  inhaler daily and using albuterol  inhaler occasionally for flareups but has been well-controlled for most part.  Requesting order for lung CT for lung cancer screening.  Started smoking at the age of 22 years, initially was a light smoker but was heavy smoker for 5 years and did quit in between.  He quit smoking in 2019.  He is not taking any anxiety and depression medications, has issues with sleep that he relates to his mother's ill health.  He is on waiting list to see a therapist.  Has been on medications but had side  effects.  Off of gabapentin  that he was taking for disc issues in his neck but has been managing it with exercise has had multiple ER visits in between the last time he saw me for mostly urology symptoms ferry 2024, June 2024 and most recently in March this year for flulike symptoms currently denies any concerns.  Uses Flonase  on as-needed basis.  Denies any recent falls.          ROS   Review of Systems   Constitutional:  Negative for appetite change, chills, diaphoresis, fatigue and fever.   HENT:  Negative for congestion, ear pain, mouth sores, postnasal drip, sinus pressure and trouble swallowing.    Eyes:  Negative for photophobia, pain and visual disturbance.   Respiratory:  Negative for cough, chest tightness, shortness of breath, wheezing and stridor.    Cardiovascular:  Negative for chest pain, palpitations and leg swelling.   Gastrointestinal:  Negative for abdominal distention, abdominal pain, blood in stool, constipation, diarrhea, nausea and vomiting.   Endocrine: Negative for polydipsia, polyphagia and polyuria.   Genitourinary:  Negative for decreased urine volume, difficulty urinating, dysuria, flank pain, frequency and urgency.   Musculoskeletal:  Positive for myalgias and neck pain. Negative for arthralgias, back pain, gait problem and joint swelling.   Skin:  Negative for  color change and rash.   Allergic/Immunologic: Negative for environmental allergies and food allergies.   Neurological:  Positive for numbness (off and on). Negative for dizziness, tremors, syncope, weakness, light-headedness and headaches.   Hematological:  Negative for adenopathy. Does not bruise/bleed easily.   Psychiatric/Behavioral:  Positive for decreased concentration, dysphoric mood and sleep disturbance. Negative for confusion and hallucinations. The patient is nervous/anxious.        Vital Signs   There were no vitals taken for this visit.    Physical Exam   Physical Exam  Constitutional:       General: He is awake.       Appearance: Normal appearance. He is well-developed. He is not ill-appearing.   HENT:      Head: Normocephalic and atraumatic.      Mouth/Throat:      Lips: No lesions.   Eyes:      General: No scleral icterus.  Neurological:      Mental Status: He is alert.      Cranial Nerves: No dysarthria or facial asymmetry.   Psychiatric:         Attention and Perception: Attention normal.         Mood and Affect: Mood and affect normal.         Speech: Speech normal.         Behavior: Behavior is cooperative.           Little interest or pleasure in doing things: 2 (07/16/2024  7:42 AM)  Feeling down, depressed, or hopeless: 1 (07/16/2024  7:42 AM)  Trouble falling or staying asleep, or sleeping too much: 2 (07/16/2024  7:42 AM)  Feeling tired or having little energy: 0 (07/16/2024  7:42 AM)  Poor appetite or overeating: 0 (07/16/2024  7:42 AM)  Feeling bad about yourself - or that you are a failure or have let yourself or your family down: 2 (07/16/2024  7:42 AM)  Trouble concentrating on things, such as reading the newspaper or watching television: 1 (07/16/2024  7:42 AM)  Moving or speaking so slowly that other people could have noticed. Or the opposite - being so fidgety or restless that you have been moving around a lot more than usual: 0 (07/16/2024  7:42 AM)  Thoughts that you would be better off dead, or of hurting yourself in some way: 0 (07/16/2024  7:42 AM)  PHQ Total Score: 8 (07/16/2024  7:42 AM)  If you checked off any problems, how difficult have these problems made it for you to do your work, take care of things at home, or get along with other people?: Very difficult (07/16/2024  7:42 AM)      Assessment/Plan     Problem List Items Addressed This Visit          Cardiovascular and Mediastinum    Chronic pulmonary embolism without acute cor pulmonale (CMS/HCC) (Chronic)    Remain on anticoagulation for 5 months and took himself off of anticoagulation and defers hypercoagulable workup or referral to hematology for now.          Relevant Orders    CT SCREENING LUNG LOW DOSE    Follow Up In Primary Care    Mixed hyperlipidemia (Chronic)    Off of statins by choice for more than 5 months managing with diet and excise and would like to hold off for now.  Fasting labs ordered.         Relevant Orders    Comprehensive Metabolic Panel  Lipid Panel    Follow Up In Primary Care       Respiratory    Mild persistent asthma without complication - Primary (Chronic)    Remains on Advair  daily and using albuterol  inhaler as needed managed by pulmonologist.         Relevant Orders    CBC with Differential (Order)    Follow Up In Primary Care       Digestive    Gastroesophageal reflux disease without esophagitis (Chronic)    Remains on PPIs and scheduled for EGD next week.  Consult notes with GI reviewed and discussed.  Labs ordered to include CBC, CMP and lipase level as ordered by Dr. Tressia to be done along with blood work ordered by me today.         Relevant Orders    Comprehensive Metabolic Panel    Follow Up In Primary Care    Lipase       Nervous and Auditory    Cervical radiculopathy (Chronic)    Currently managing with lifestyle changes and off of gabapentin  with off-and-on flareup of symptoms.            Other    PTSD (post-traumatic stress disorder) (Chronic)    Not on pharmacotherapy and plans on following up with therapist and on a waiting list.         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  Discussed the role of SSRI/SNRI that may help with depression symptoms as well as insomnia, deferred for now.  Detailed discussion done about red flag signs including SI/HI counseling and supportive care provided.         Relevant Orders    Follow Up In Primary Care    Insomnia, unspecified type (Chronic)    Reemphasized the importance of sleep hygiene.  Discussed could consider SSRI/SNRI that may help  with both underlying mood disorder as well as insomnia but deferred for now.         Former smoker    LDCT Lung Cancer Screening:   July 16, 2024   Bill Kaiser  [birth year]  Age: 58 y.o.     Social History     Tobacco Use   Smoking Status Some Days    Current packs/day: 0.50    Average packs/day: 0.5 packs/day for 0.5 years (0.3 ttl pk-yrs)    Types: Cigars, Cigarettes    Start date: 2025   Smokeless Tobacco Never   Tobacco Comments    Occasional vaping      Imaging:  [x]  LDCT Lung Screening (baseline)  []  LDCT Annual Lung Screening Exam (already had baseline)    Shared Decision Making:  [x]  Reviewed the USPSTF recommendation for annual screening for lung cancer with LDCT for adults aged 83 to 80 years who have a 20 pack-year smoking history and currently smoke or have quit within the past 15 years.  [x]  Pt is asymptomatic ( no fever, chest pain, new dyspnea, new or changing cough, hemoptysis, or unexplained significant weight loss)  [x]  Pt participated in a shared decision making discussion during which the potential risks and benefits of CT lung cancer screening were discussed, including over-diagnosis, false positive rates, radiation exposure, and follow-up diagnostic testing.  [x]  Pt was informed of the importance of adherence to annual screening, impact of comorbidities, and ability/willingness to undergo diagnosis and treatment.  [  x] Pt was informed of the importance of smoking cessation and/or maintaining smoking abstinence, including the offer of Medicare-covered tobacco cessation counseling services, if applicable.         [name], MD                Relevant Orders    Follow Up In Primary Care    Encounter for prostate cancer screening    PSA level ordered.         Relevant Orders    Prostate Specific Antigen, Annual Screening    Follow Up In Primary Care     Other Visit Diagnoses         High risk medication use        Relevant Orders    CBC with Differential (Order)    Comprehensive Metabolic Panel     TSH    Lipid Panel    Hemoglobin A1C    Follow Up In Primary Care          The following activities were performed on the date of service:  preparing to see the patient: - chart review   - review of prior labs  - review of prior imaging tests  - review of consultant reports  - review of prior ED visits  performing a medically appropriate examination and/or evaluation   counseling and educating the patient/family/caregiver  ordering medications, tests, or procedures  documenting clinical information in the electronic or other health records    Total time spent performing activities on date of service:  42  minutes

## 2024-07-16 NOTE — Assessment & Plan Note (Signed)
 LDCT Lung Cancer Screening:   July 16, 2024   Bill Kaiser  January 02, 1966  Age: 58 y.o.     Social History     Tobacco Use   Smoking Status Some Days    Current packs/day: 0.50    Average packs/day: 0.5 packs/day for 0.5 years (0.3 ttl pk-yrs)    Types: Cigars, Cigarettes    Start date: 2025   Smokeless Tobacco Never   Tobacco Comments    Occasional vaping      Imaging:  [x]  LDCT Lung Screening (baseline)  []  LDCT Annual Lung Screening Exam (already had baseline)    Shared Decision Making:  [x]  Reviewed the USPSTF recommendation for annual screening for lung cancer with LDCT for adults aged 12 to 80 years who have a 20 pack-year smoking history and currently smoke or have quit within the past 15 years.  [x]  Pt is asymptomatic ( no fever, chest pain, new dyspnea, new or changing cough, hemoptysis, or unexplained significant weight loss)  [x]  Pt participated in a shared decision making discussion during which the potential risks and benefits of CT lung cancer screening were discussed, including over-diagnosis, false positive rates, radiation exposure, and follow-up diagnostic testing.  [x]  Pt was informed of the importance of adherence to annual screening, impact of comorbidities, and ability/willingness to undergo diagnosis and treatment.  [x]  Pt was informed of the importance of smoking cessation and/or maintaining smoking abstinence, including the offer of Medicare-covered tobacco cessation counseling services, if applicable.         Bill Silvers, MD

## 2024-07-16 NOTE — Assessment & Plan Note (Signed)
 Remains on Advair  daily and using albuterol  inhaler as needed managed by pulmonologist.

## 2024-07-16 NOTE — Assessment & Plan Note (Signed)
 Off of statins by choice for more than 5 months managing with diet and excise and would like to hold off for now.  Fasting labs ordered.

## 2024-07-16 NOTE — Assessment & Plan Note (Signed)
 PSA level ordered.

## 2024-07-16 NOTE — Assessment & Plan Note (Signed)
 Currently managing with lifestyle changes and off of gabapentin  with off-and-on flareup of symptoms.

## 2024-07-16 NOTE — Assessment & Plan Note (Signed)
 Not on pharmacotherapy and defers for now as he is on a waiting list to try cognitive behavioral therapy.  Discussed the role of SSRI/SNRI that may help with depression symptoms as well as insomnia, deferred for now.  Detailed discussion done about red flag signs including SI/HI counseling and supportive care provided.

## 2024-07-16 NOTE — Assessment & Plan Note (Signed)
 Remains on PPIs and scheduled for EGD next week.  Consult notes with GI reviewed and discussed.  Labs ordered to include CBC, CMP and lipase level as ordered by Dr. Tressia to be done along with blood work ordered by me today.

## 2024-07-22 ENCOUNTER — Ambulatory Visit: Admitting: Anesthesiology

## 2024-07-22 ENCOUNTER — Ambulatory Visit
Admission: RE | Admit: 2024-07-22 | Discharge: 2024-07-22 | Disposition: A | Discharge status: Home / Self Care | Source: Ambulatory Visit | Attending: Gastroenterology | Admitting: Gastroenterology

## 2024-07-22 ENCOUNTER — Encounter
Admission: RE | Disposition: A | Discharge status: Home / Self Care | Payer: Self-pay | Source: Ambulatory Visit | Attending: Gastroenterology

## 2024-07-22 ENCOUNTER — Encounter: Payer: Self-pay | Admitting: Gastroenterology

## 2024-07-22 DIAGNOSIS — R1013 Epigastric pain: Secondary | ICD-10-CM

## 2024-07-22 DIAGNOSIS — K219 Gastro-esophageal reflux disease without esophagitis: Secondary | ICD-10-CM | POA: Insufficient documentation

## 2024-07-22 DIAGNOSIS — E782 Mixed hyperlipidemia: Secondary | ICD-10-CM | POA: Insufficient documentation

## 2024-07-22 DIAGNOSIS — R1310 Dysphagia, unspecified: Secondary | ICD-10-CM

## 2024-07-22 DIAGNOSIS — K2900 Acute gastritis without bleeding: Secondary | ICD-10-CM | POA: Insufficient documentation

## 2024-07-22 DIAGNOSIS — J8283 Eosinophilic asthma: Secondary | ICD-10-CM | POA: Insufficient documentation

## 2024-07-22 DIAGNOSIS — F4310 Post-traumatic stress disorder, unspecified: Secondary | ICD-10-CM | POA: Insufficient documentation

## 2024-07-22 DIAGNOSIS — M5412 Radiculopathy, cervical region: Secondary | ICD-10-CM | POA: Insufficient documentation

## 2024-07-22 DIAGNOSIS — M47816 Spondylosis without myelopathy or radiculopathy, lumbar region: Secondary | ICD-10-CM | POA: Insufficient documentation

## 2024-07-22 DIAGNOSIS — I2782 Chronic pulmonary embolism: Secondary | ICD-10-CM | POA: Insufficient documentation

## 2024-07-22 HISTORY — PX: ESOPHAGOGASTRODUODENOSCOPY (EGD), SCREENING: SHX00151

## 2024-07-22 HISTORY — PX: ESOPHAGOGASTRODUODENOSCOPY (EGD), ESOPHAGEAL DILATION (USING BALLOONS 30MM OR LARGER), WITH OR WITHOUT IMAGING GUIDANCE: SHX3805

## 2024-07-22 SURGERY — ESOPHAGOGASTRODUODENOSCOPY (EGD), SCREENING
Anesthesia: Anesthesia General | Site: Abdomen

## 2024-07-22 MED ORDER — OMEPRAZOLE 40 MG PO CPDR
40.0000 mg | DELAYED_RELEASE_CAPSULE | Freq: Two times a day (BID) | ORAL | 1 refills | Status: AC
Start: 2024-07-22 — End: ?

## 2024-07-22 MED ORDER — FENTANYL CITRATE (PF) 50 MCG/ML IJ SOLN (WRAP)
INTRAMUSCULAR | Status: DC | PRN
Start: 2024-07-22 — End: 2024-07-22
  Administered 2024-07-22 (×2): 50 ug via INTRAVENOUS

## 2024-07-22 MED ORDER — FENTANYL CITRATE (PF) 50 MCG/ML IJ SOLN (WRAP)
INTRAMUSCULAR | Status: AC
Start: 2024-07-22 — End: 2024-07-22
  Filled 2024-07-22: qty 2

## 2024-07-22 MED ORDER — LIDOCAINE HCL (PF) 1 % IJ SOLN
INTRAMUSCULAR | Status: DC | PRN
Start: 2024-07-22 — End: 2024-07-22
  Administered 2024-07-22: 80 mg via INTRAVENOUS

## 2024-07-22 MED ORDER — SODIUM CHLORIDE 0.9 % IV SOLN
INTRAVENOUS | Status: AC | PRN
Start: 2024-07-22 — End: 2024-07-22
  Filled 2024-07-22: qty 1000

## 2024-07-22 MED ORDER — PROPOFOL 10 MG/ML IV EMUL (WRAP)
INTRAVENOUS | Status: DC | PRN
Start: 2024-07-22 — End: 2024-07-22
  Administered 2024-07-22: 100 mg via INTRAVENOUS
  Administered 2024-07-22 (×3): 30 mg via INTRAVENOUS
  Administered 2024-07-22: 40 mg via INTRAVENOUS

## 2024-07-22 SURGICAL SUPPLY — 23 items
BASIN EMESIS 700 ML GRADUATE GRAPHITE MAUVE PLASTIC MEDLINE ORAL (Patient Supply) ×2 IMPLANT
BLOCK BITE OD54 FR ELASTIC ADJUSTABLE STRAP BLOX (Procedure Accessories) ×2 IMPLANT
BLOCK BITE OD54 FR ELASTIC ADJUSTABLE STRAP BLOXâ„¢ (Procedure Accessories) ×2 IMPLANT
CANNULA NASAL OD6 MM ID22 MM L7 FT PEDIATRIC DIVIDE O2 CO2 LINE MALE (Cannula) ×2 IMPLANT
CAP ENDOSCOPE SEALING HYDRA OLYMPUS L16 IN L18IN WATER BTTL CO2 CLIP (GE Lab Supplies) ×2 IMPLANT
CONTAINER HST 10% NTRL BF FRMLN PP 45ML (Solution) IMPLANT
CUFF BLOOD PRESSURE ADULT 1 TUBE BAYONET CONNECTOR COLOR CODED HOOK (Cuff) ×2 IMPLANT
ELECTRODE ADULT PATIENT RETURN L9 FT REM POLYHESIVE ACRYLIC FOAM (Procedure Accessories) IMPLANT
FORCEPS BIOPSY L240 CM MICROMESH TEETH STREAMLINE CATHETER NEEDLE (Instrument) IMPLANT
GLOVE EXAM LARGE NITRILE CHEMOTHERAPY POWDER FREE SENSE OATMEAL (Glove) ×2 IMPLANT
GLOVE EXAM LARGE NITRILE POWDER FREE SENSE OATMEAL (Glove) ×2 IMPLANT
GOWN ISOLATION REGULAR LARGE LEVEL 2 FULL BACK OVERHEAD THUMB HOOK SMS (Gown) ×4 IMPLANT
GOWN ISOLATION RGLR LG LVL 2 FLL BCK OVERHEAD THMB HK MEDLN SMS YELLOW (Gown) ×4 IMPLANT
KIT ENDOSCOPIC L10 FT 2 END SYRINGE CLEAN ADAPTER COMPLIANCE ENDOKIT (Procedure Accessories) ×2 IMPLANT
KIT IV SECUREMENT DEVICE IV START MEDLINE (Kits) ×2 IMPLANT
MANIFOLD SUCTION 2 STANDARD 4 PORT NEPTUNE 2 WASTE MANAGEMENT SYSTEM (Filter) ×2 IMPLANT
OXIMETER PULSE SPO2 OLED DISPLAY SOFT PAD PLETHYSMOGRAPH ADULT (Procedure Accessories) ×2 IMPLANT
RESUSCITATOR MANUAL ADULT MEDIUM O2 RESERVOIR BAG MASK SPUR II (Other) IMPLANT
SPONGE GAUZE L4 IN X W4 IN 4 PLY ABSORBENT RAYON POLYESTER (Procedure Accessories) ×2 IMPLANT
SYRINGE CATHETER TIP GRADUATED NONPYROGENIC DEHP FREE PVC FREE 50 ML (Syringes, Needles) ×2 IMPLANT
TUBING IRRIGATION HYDRA (Tubing) ×2 IMPLANT
WATER STERILE PLASTIC POUR BOTTLE 1000 ML (Irrigation Solutions) ×2 IMPLANT
WATER STERILE PLASTIC POUR BOTTLE 500 ML (Irrigation Solutions) ×2 IMPLANT

## 2024-07-22 NOTE — H&P (Signed)
 GI PRE PROCEDURE NOTE    Proceduralist Comments:   Review of Systems and Past Medical / Surgical History performed: Yes     Indications: Abdominal pain, Dysphagia, and GERD     Medications Taking[1]    Allergies[2]    Social History - non-contributory  Family History - non-contributory    Review of Systems - General ROS: negative for - chills, fever or malaise  Respiratory ROS: no cough, shortness of breath, or wheezing  Cardiovascular ROS: no chest pain or dyspnea on exertion  Gastrointestinal ROS: no abdominal pain, change in bowel habits, or black or bloody stools      Physical Exam / Laboratory Data (If applicable)       General: Alert and cooperative  Lungs: Lungs clear to auscultation  Cardiac: RRR, normal S1S2.    Abdomen: Soft, non tender. Normal active bowel sounds  Neurological: denies headache weakness  Other:         EGD with biopsy, possible dilation          Planned Sedation:   Deep sedation with anesthesia    Attestation:   Bill Kaiser has been reassessed immediately prior to the procedure and is an appropriate candidate for the planned sedation and procedure. Risks, benefits and alternatives to the planned procedure and sedation have been explained to the patient or guardian:  yes        Signed by: Alyce SHAUNNA Sprang, MD                                                                                     [1]   Outpatient Medications Marked as Taking for the 07/22/24 encounter [facility] Encounter)   Medication Sig Dispense Refill    Advair  Diskus 250-50 MCG/ACT Aerosol Pwdr, Breath Activated Inhale 1 puff into the lungs 2 (two) times daily 1 each 5    albuterol  sulfate HFA (PROVENTIL ) 108 (90 Base) MCG/ACT inhaler Inhale 2 puffs into the lungs every 4 (four) hours as needed for Wheezing 3 each 0    alpha lipoic acid 200 MG Cap Take by mouth      Amino Acids (AMINO ACID PO) Take by mouth      Ascorbic Acid (VITAMIN C) 1000 MG tablet Take 1 tablet (1,000 mg) by mouth once daily 2-3 tablets       atorvastatin  (LIPITOR) 40 MG tablet Take 1 tablet (40 mg) by mouth daily 90 tablet 3    b complex vitamins capsule Take 1 capsule by mouth every other day      Cholecalciferol (VITAMIN D3) 3000 units Tab Take by mouth      hydrocortisone  2.5 % cream Apply topically 2 (two) times daily (Patient taking differently: Apply topically 2 (two) times daily as needed (rash)) 20 g 0    magnesium 30 MG tablet Take 1 tablet (30 mg) by mouth 2 (two) times daily      pantoprazole  (PROTONIX ) 40 MG tablet TAKE 1 TABLET BY MOUTH EVERY DAY 90 tablet 3    vitamin A 10000 UNIT capsule Take 1 capsule (10,000 Units) by mouth     [2]   Allergies  Allergen Reactions  Penicillins      Patient states mother told him he is allergic to penicillin.  He tolerates amoxicillin .

## 2024-07-22 NOTE — Anesthesia Preprocedure Evaluation (Addendum)
 Anesthesia Evaluation    AIRWAY    Mallampati: II    TM distance: >3 FB  Neck ROM: full  Mouth Opening:full   CARDIOVASCULAR    cardiovascular exam normal       DENTAL                     PULMONARY    pulmonary exam normal     OTHER FINDINGS                                        Relevant Problems   CARDIO   (+) Chronic pulmonary embolism without acute cor pulmonale (CMS/HCC)      GI   (+) Gastroesophageal reflux disease without esophagitis      OTHER   (+) H/O asplenia      Cardiovascular and Mediastinum   (+) Mixed hyperlipidemia      Respiratory   (+) Eosinophilic asthma      Nervous and Auditory   (+) Cervical radiculopathy      Musculoskeletal and Integument   (+) Osteoarthritis of lumbar spine      Other   (+) PTSD (post-traumatic stress disorder)       PSS Anesthesia Comments: smoker        Anesthesia Plan    ASA 3     general                     Detailed anesthesia plan: general IV and MAC            informed consent obtained    Plan discussed with CRNA.                     Signed by: Wanda JAYSON Settle, MD 07/22/24 7:35 AM

## 2024-07-22 NOTE — Discharge Instructions (Signed)
 Treating Gastritis and Ulcers   Gastritis and ulcers are two conditions that can lead to pain in your upper stomach, nausea, bloating, or a burning feeling. Your treatment will depend on the cause.   Gastritis is a condition that happens when there is inflammation or irritation of the stomach lining.  An ulcer is a sore that forms on the lining of your stomach or the upper part of your small intestine.    Gastritis and ulcers can cause pain in your upper stomach.   Medicine   Your care team may give you medicine to reduce the acid in your stomach. Less acid can help your stomach lining heal. This can soothe your stomach. It is important to follow your team's instructions.   Antibiotics are only used if your care team finds a bacterium called H. pylori in your stomach. This bacterium can cause ulcers. If your tests do not show signs of H. pylori, you will not get antibiotics.   What You Can Do   Eat foods that are gentle on your stomach: This includes plain rice, bananas, applesauce, and toast.  Avoid spicy foods, alcohol, and drinks with caffeine : These can make symptoms worse.  Eat small meals and take your time: Eating slowly can help your stomach feel better.  Do not take NSAIDs: Some over-the-counter pain medicines called NSAIDs can irritate your stomach. These include aspirin and ibuprofen , like Advil  and Motrin . Ask your care team which medicines are safe for you.  Relax: Stress can make your stomach feel worse. To help you relax, try deep breathing, gentle walks, or activities you enjoy.  Do not smoke: This can slow down healing. Your care team can help you find ways to quit.  Drink plenty of water: This helps you stay hydrated.  Listen to your body: Tell your care team if something does not feel right.  When to Contact Your Care Team   Call if you have:   Severe or ongoing pain  Black or bloody stools  Blood in your vomit, which may look like coffee grounds  Lightheadedness or fainting  If you cannot  reach your care team, go to the Emergency Room.   Thank You for Choosing Us    Thank you for trusting us  with your care. We are here to support you and want you to feel your best. Contact us  with any questions.   IF YOU HAVE A MEDICAL EMERGENCY, CALL 911 OR GO TO THE EMERGENCY ROOM.   The information presented is intended for general information and educational purposes. It is not intended to replace the advice of your health care provider. Contact your health care provider if you believe you have a health problem.   Last updated February 2025    2025 [employer] All rights reserved.

## 2024-07-22 NOTE — Anesthesia Postprocedure Evaluation (Signed)
 Anesthesia Post Evaluation    Patient: Bill Kaiser    ESOPHAGOGASTRODUODENOSCOPY (EGD), SCREENING (Abdomen)  ESOPHAGOGASTRODUODENOSCOPY (EGD), ESOPHAGEAL DILATION (USING BALLOONS OR LARGER), WITH OR WITHOUT IMAGING GUIDANCE    Anesthesia type: general    Last Vitals:   Vitals Value Taken Time   BP 100/70 07/22/24 10:10   Temp 36.3 C (97.4 F) 07/22/24 10:02   Pulse 69 07/22/24 10:10   Resp 18 07/22/24 10:10   SpO2 97 % 07/22/24 10:10                 Anesthesia Post Evaluation:     Patient Evaluated: PACU    Level of Consciousness: awake and alert    Pain Management: adequate    Airway Patency: patent        Anesthetic complications: No      PONV Status: none    Cardiovascular status: acceptable  Respiratory status: acceptable  Hydration status: acceptable          Signed by: Wanda JAYSON Settle, MD, 07/22/2024 10:30 AM

## 2024-07-22 NOTE — Discharge Instr - AVS First Page (Addendum)
 Instructions for after your discharge:  General Instructions:  Dr. Doreene Adas, Sean: [phone]  Following sedation, your judgment, perception, and coordination are considered impaired. Even though you may feel awake and alert, you are considered legally intoxicated. Therefore, until the next morning:  Do not drive  Do not operate appliances or equipment that requires reaction time (e.g. stove, electrical tools, machinery)  Do not sign legal documents or be involved in important decisions  Do not smoke if alone  Do not drink alcoholic beverages    Go directly home and rest for several hours before resuming your routine activities  It is highly recommended to have a responsible adult stay with you for the next 24 hours    Tenderness, swelling, or pain may occur at the IV site where you received sedation. If you experience this, apply warm soaks to the area. Notify your physician if this persists.   If you have questions or problems, contact your MD immediately. If you need immediate attention, call your MD, 911 and/or go to the nearest emergency room.       Report to the Physician any of the following:    For Upper Endoscopy, ERCP, EUS, or Dilations:  Pain in chest  Nausea/vomiting  Fever/chills within 24 hours after procedure. Temp > 101 degrees Farenheit  Severe and persistent abdominal pain or bloating    * Mild throat soreness may follow this procedure. Warm salt water gargling, lozenges, or cold drink and popscicles of your choice will most likely relieve your discomfort    *Special instructions: Follow MD instructions on procedural report.          During endoscopy, a long, flexible tube is used to view the inside of your upper GI tract.

## 2024-07-23 ENCOUNTER — Encounter: Payer: Self-pay | Admitting: Gastroenterology

## 2024-07-26 LAB — SURGICAL PATHOLOGY

## 2024-08-12 ENCOUNTER — Encounter (INDEPENDENT_AMBULATORY_CARE_PROVIDER_SITE_OTHER): Payer: Self-pay | Admitting: Family Medicine

## 2024-09-06 ENCOUNTER — Encounter (INDEPENDENT_AMBULATORY_CARE_PROVIDER_SITE_OTHER): Admitting: Family Medicine

## 2024-09-08 ENCOUNTER — Encounter (INDEPENDENT_AMBULATORY_CARE_PROVIDER_SITE_OTHER): Payer: Self-pay

## 2024-09-08 ENCOUNTER — Ambulatory Visit (INDEPENDENT_AMBULATORY_CARE_PROVIDER_SITE_OTHER): Payer: Self-pay | Admitting: Family Medicine

## 2024-09-08 ENCOUNTER — Encounter (INDEPENDENT_AMBULATORY_CARE_PROVIDER_SITE_OTHER): Payer: Self-pay | Admitting: Family Medicine

## 2024-09-08 ENCOUNTER — Ambulatory Visit (FREE_STANDING_LABORATORY_FACILITY): Admitting: Family Medicine

## 2024-09-08 VITALS — BP 139/90 | HR 67 | Temp 98.0°F | Ht 75.0 in | Wt 237.0 lb

## 2024-09-08 DIAGNOSIS — F331 Major depressive disorder, recurrent, moderate: Secondary | ICD-10-CM

## 2024-09-08 DIAGNOSIS — E663 Overweight: Secondary | ICD-10-CM

## 2024-09-08 DIAGNOSIS — M25512 Pain in left shoulder: Secondary | ICD-10-CM | POA: Insufficient documentation

## 2024-09-08 DIAGNOSIS — Z1211 Encounter for screening for malignant neoplasm of colon: Secondary | ICD-10-CM

## 2024-09-08 DIAGNOSIS — Z79899 Other long term (current) drug therapy: Secondary | ICD-10-CM

## 2024-09-08 DIAGNOSIS — R030 Elevated blood-pressure reading, without diagnosis of hypertension: Secondary | ICD-10-CM

## 2024-09-08 DIAGNOSIS — M47816 Spondylosis without myelopathy or radiculopathy, lumbar region: Secondary | ICD-10-CM

## 2024-09-08 DIAGNOSIS — G8929 Other chronic pain: Secondary | ICD-10-CM

## 2024-09-08 DIAGNOSIS — E66811 Obesity, class 1: Secondary | ICD-10-CM

## 2024-09-08 DIAGNOSIS — R41840 Attention and concentration deficit: Secondary | ICD-10-CM

## 2024-09-08 DIAGNOSIS — Z125 Encounter for screening for malignant neoplasm of prostate: Secondary | ICD-10-CM

## 2024-09-08 DIAGNOSIS — F4310 Post-traumatic stress disorder, unspecified: Secondary | ICD-10-CM

## 2024-09-08 DIAGNOSIS — I2782 Chronic pulmonary embolism: Secondary | ICD-10-CM

## 2024-09-08 DIAGNOSIS — E782 Mixed hyperlipidemia: Secondary | ICD-10-CM

## 2024-09-08 DIAGNOSIS — M5412 Radiculopathy, cervical region: Secondary | ICD-10-CM

## 2024-09-08 DIAGNOSIS — G4700 Insomnia, unspecified: Secondary | ICD-10-CM

## 2024-09-08 DIAGNOSIS — K219 Gastro-esophageal reflux disease without esophagitis: Secondary | ICD-10-CM

## 2024-09-08 DIAGNOSIS — Z0000 Encounter for general adult medical examination without abnormal findings: Secondary | ICD-10-CM

## 2024-09-08 DIAGNOSIS — Z1212 Encounter for screening for malignant neoplasm of rectum: Secondary | ICD-10-CM

## 2024-09-08 DIAGNOSIS — J302 Other seasonal allergic rhinitis: Secondary | ICD-10-CM | POA: Insufficient documentation

## 2024-09-08 DIAGNOSIS — J4530 Mild persistent asthma, uncomplicated: Secondary | ICD-10-CM

## 2024-09-08 DIAGNOSIS — Z87891 Personal history of nicotine dependence: Secondary | ICD-10-CM

## 2024-09-08 DIAGNOSIS — Z23 Encounter for immunization: Secondary | ICD-10-CM

## 2024-09-08 LAB — LAB USE ONLY - CBC WITH DIFFERENTIAL
Absolute Basophils: 0.01 x10 3/uL (ref 0.00–0.08)
Absolute Eosinophils: 0.13 x10 3/uL (ref 0.00–0.44)
Absolute Immature Granulocytes: 0.01 x10 3/uL (ref 0.00–0.07)
Absolute Lymphocytes: 1.59 x10 3/uL (ref 0.42–3.22)
Absolute Monocytes: 0.69 x10 3/uL (ref 0.21–0.85)
Absolute Neutrophils: 3.86 x10 3/uL (ref 1.10–6.33)
Absolute nRBC: 0 x10 3/uL (ref <=0.00)
Basophils %: 0.2 %
Eosinophils %: 2.1 %
Hematocrit: 46 % (ref 37.6–49.6)
Hemoglobin: 14.9 g/dL (ref 12.5–17.1)
Immature Granulocytes %: 0.2 %
Lymphocytes %: 25.3 %
MCH: 30.5 pg (ref 25.1–33.5)
MCHC: 32.4 g/dL (ref 31.5–35.8)
MCV: 94.3 fL (ref 78.0–96.0)
MPV: 11.2 fL (ref 8.9–12.5)
Monocytes %: 11 %
Neutrophils %: 61.2 %
Platelet Count: 243 x10 3/uL (ref 142–346)
Preliminary Absolute Neutrophil Count: 3.86 x10 3/uL (ref 1.10–6.33)
RBC: 4.88 x10 6/uL (ref 4.20–5.90)
RDW: 12 % (ref 11–15)
WBC: 6.29 x10 3/uL (ref 3.10–9.50)
nRBC %: 0 /100{WBCs} (ref <=0.0)

## 2024-09-08 LAB — COMPREHENSIVE METABOLIC PANEL
ALT: 40 U/L (ref <=55)
AST (SGOT): 39 U/L (ref <=41)
Albumin/Globulin Ratio: 1.2 (ref 0.9–2.2)
Albumin: 4 g/dL (ref 3.5–5.0)
Alkaline Phosphatase: 60 U/L (ref 37–117)
Anion Gap: 10 (ref 5.0–15.0)
BUN: 16 mg/dL (ref 9–28)
Bilirubin, Total: 0.6 mg/dL (ref 0.2–1.2)
CO2: 25 meq/L (ref 17–29)
Calcium: 8.9 mg/dL (ref 8.5–10.5)
Chloride: 103 meq/L (ref 99–111)
Creatinine: 0.9 mg/dL (ref 0.5–1.5)
GFR: >60 mL/min/1.73 m2 (ref >=60.0)
Globulin: 3.3 g/dL (ref 2.0–3.6)
Glucose: 95 mg/dL (ref 70–100)
Hemolysis Index: 11 {index}
Potassium: 4.9 meq/L (ref 3.5–5.3)
Protein, Total: 7.3 g/dL (ref 6.0–8.3)
Sodium: 138 meq/L (ref 135–145)

## 2024-09-08 LAB — LIPID PANEL
Cholesterol / HDL Ratio: 4 {index}
Cholesterol: 207 mg/dL — ABNORMAL HIGH (ref <=199)
HDL: 52 mg/dL (ref >=40)
LDL Calculated: 131 mg/dL — ABNORMAL HIGH (ref 0–99)
Triglycerides: 122 mg/dL (ref 34–149)
VLDL Calculated: 24 mg/dL (ref 10–40)

## 2024-09-08 LAB — HEMOGLOBIN A1C
Average Estimated Glucose: 102.5 mg/dL
Hemoglobin A1C: 5.2 % (ref 4.6–5.6)

## 2024-09-08 LAB — LIPASE: Lipase: 31 U/L (ref 8–78)

## 2024-09-08 LAB — PSA TOTAL, ANNUAL SCREENING: Prostate Specific Antigen, Total: 0.2 ng/mL (ref 0.000–4.000)

## 2024-09-08 LAB — TSH: TSH: 1.78 u[IU]/mL (ref 0.35–4.94)

## 2024-09-08 MED ORDER — FLUTICASONE PROPIONATE 50 MCG/ACT NA SUSP: 2.0000 | Freq: Every day | NASAL | 4 refills | Status: DC

## 2024-09-08 MED ORDER — ATORVASTATIN CALCIUM 40 MG PO TABS
40.0000 mg | ORAL_TABLET | Freq: Every day | ORAL | 3 refills | Status: AC
Start: 2024-09-08 — End: ?

## 2024-09-08 NOTE — Assessment & Plan Note (Signed)
 Referral provided for pain management

## 2024-09-08 NOTE — Assessment & Plan Note (Addendum)
 Repeat labs awaited.  Discussed importance of statins in addition to lowering LDL including plaque stability as well as protective effect on the myocardium. New prescription provided.

## 2024-09-08 NOTE — Assessment & Plan Note (Signed)
 Not on pharmacotherapy and reports doing well with lifestyle changes including exercise.

## 2024-09-08 NOTE — Assessment & Plan Note (Signed)
 Denies any current concerns.

## 2024-09-08 NOTE — Assessment & Plan Note (Signed)
 Initial blood pressure elevated but repeat improved but still above goal.  Advised ambulatory blood pressure monitoring along with emphasis on lifestyle changes and to follow-up within the next 2 to [redacted] weeks along with blood pressure machine to calibrate readings to review to see if may need to consider pharmacotherapy.

## 2024-09-08 NOTE — Assessment & Plan Note (Addendum)
 Not on pharmacotherapy as he has been doing well with lifestyle changes and would like to continue with the same.  Recommended to follow-up in case of any concerns in near future because of fungal therapy.  Detailed discussion done about red flag signs including SI/HI counseling and supportive care provided.

## 2024-09-08 NOTE — Assessment & Plan Note (Signed)
 No reported concerns with the first follow-up with hematology for now for hypercoagulable workup.

## 2024-09-08 NOTE — Assessment & Plan Note (Signed)
 Recommend influenza vaccine and pneumonia vaccine.

## 2024-09-08 NOTE — Assessment & Plan Note (Signed)
 PSA level awaited.

## 2024-09-08 NOTE — Assessment & Plan Note (Signed)
 Order reprinted for low-dose lung CT as requested.

## 2024-09-08 NOTE — Assessment & Plan Note (Signed)
 Refill provided for flonase  as requested.

## 2024-09-08 NOTE — Assessment & Plan Note (Signed)
 Advised supportive measures including at home exercises and physical therapy.  Recommended to reach out and or follow-up in case symptoms persist or worsen to consider advanced imaging such as MRI and or referral to subspecialty.

## 2024-09-08 NOTE — Assessment & Plan Note (Signed)
 Body mass index is 29.62 kg/m.  - Pt's weight has decreased compared to most recent documented weights.  - Counseled on exercise including obtaining at least 150 minutes of aerobic exercise per week and eating a healthy well balanced diet.  - Weight loss and lifestyle modification strategies discussed with patient.  - Behavioral strategies discussed -self monitoring, tracking dietary intake, identify food triggers, stimulus control, mental restructuring.  - Dietary interventions discussed - calorie reduction, healthy diet choices with an emphasis on plant-based foods, lean proteins, and reducing high-carbohydrate foods.  - Informal exercise measures discussed, e.g. taking stairs instead of elevator, obtaining 5K-10K steps per day.  - Regular aerobic exercise program discussed - goal 150 mins of aerobic exercise per week.

## 2024-09-08 NOTE — Assessment & Plan Note (Signed)
 Reemphasized the importance of sleep hygiene.  Discussed could consider SSRI/SNRI that may help with both underlying mood disorder as well as insomnia but deferred for now.

## 2024-09-08 NOTE — Assessment & Plan Note (Addendum)
 Visit Type: Health Maintenance Visit  Work Status: working part time  Reported Health: good health  Diet: compliant with well-balanced diet  Exercise: regularly  Dental: dentist visit within 6-12 months  Vision: glasses and eye exam > 1 year ago  Hearing: normal hearing  Immunization Status: flu vaccine and PNA vaccine  Reproductive Health: sexually active  Prior Screening Tests: PSA 2023, last colonoscopy in 2023, last LDCT in 2022, and dexa scan not appropriate at this time  General Health Risks: family history of prostate cancer, no family history of colon cancer, and previous colon polyps  Safety Elements Used: uses seat belts, smoke detectors in household, carbon monoxide detectors in household, sunscreen use, and does not text and drive      6/78/7977     8:50 PM 11/05/2021     9:31 AM 07/25/2022    11:33 AM 08/26/2022     7:50 AM 09/20/2022     9:47 AM 07/15/2024    12:28 PM 07/16/2024     7:42 AM   Ambulatory Screenings   Depression: PHQ2 Total Score 6  4  3  2  2  3  3    Depression: PHQ9 Total Score 12 14 5 8 3 8  8        Patient-reported    Data saved with a previous flowsheet row definition      Smoking status: former smoker; quit 2018

## 2024-09-08 NOTE — Assessment & Plan Note (Signed)
 Not on pharmacotherapy or had evaluation done and defers for now.

## 2024-09-08 NOTE — Assessment & Plan Note (Signed)
 Recent records including EGD reviewed and discussed.  Recommended to avoid triggers.

## 2024-09-08 NOTE — Progress Notes (Signed)
 [facility]   OFFICE VISIT                HPI     Chief Complaint   Patient presents with    Annual Exam      Patient is here for annual wellness exam as well as follow-up.  Patient was advised in addition to wellness exam which is a preventive care exam,  office visit will be charged for discussion/management of existing chronic medical conditions and any new problems addressed.   Patient verbalized understanding and agreement.  He is fasting for blood work.  Has been working on his diet and exercise and that is helping with his anxiety and depression as he has been doing well without medication.  He has not been able to follow-up with pain management and would like a referral as he was in the process of getting injections done.  Has had issues with neck and lower back.  Also reports had a shoulder injury about a year and a half ago and since then has been having off-and-on pain.  Would like to consider physical therapy.  He has been doing his exercises including getting weights in the interim.  Would like order for low-dose lung CT scan for lung cancer screening.  Not considering flu vaccine, has not thought about pneumonia vaccine yet.  Ran out of cholesterol medication but open to starting medication again and would like a prescription.  Did not experience any side effects.  Last saw me through virtual appointment July 18.  Saw GI August 12, EGD done with dilatation July 24.  Requesting Flonase  to help with nasal congestion.        ROS   Review of Systems   Constitutional:  Negative for appetite change, chills, diaphoresis, fatigue and fever.   HENT:  Negative for congestion, ear pain, mouth sores, postnasal drip, sinus pressure and trouble swallowing.    Eyes:  Negative for photophobia, pain and visual disturbance.   Respiratory:  Positive for shortness of breath (on exertion, stable). Negative for cough, chest tightness, wheezing and stridor.    Cardiovascular:  Negative for chest pain, palpitations  and leg swelling.   Gastrointestinal:  Negative for abdominal distention, abdominal pain, blood in stool, constipation, diarrhea, nausea and vomiting.   Endocrine: Negative for polydipsia, polyphagia and polyuria.   Genitourinary:  Negative for decreased urine volume, difficulty urinating, dysuria, flank pain, frequency and urgency.   Musculoskeletal:  Positive for arthralgias, back pain, myalgias and neck pain. Negative for gait problem and joint swelling.   Skin:  Negative for color change and rash.   Allergic/Immunologic: Negative for environmental allergies and food allergies.   Neurological:  Positive for numbness. Negative for dizziness, tremors, syncope, weakness, light-headedness and headaches.   Hematological:  Negative for adenopathy. Does not bruise/bleed easily.   Psychiatric/Behavioral:  Negative for confusion, decreased concentration, dysphoric mood, hallucinations and sleep disturbance. The patient is not nervous/anxious.        Vital Signs   BP 139/90 (BP Site: Right arm, Patient Position: Sitting, Cuff Size: Large)   Pulse 67   Temp 98 F (36.7 C) (Oral)   Ht 1.905 m (6' 3)   Wt 107.5 kg (237 lb)   SpO2 97%   BMI 29.62 kg/m   Physical Exam   Physical Exam  Constitutional:       General: He is awake.      Appearance: Normal appearance. He is well-developed. He is obese.   HENT:  Head: Normocephalic and atraumatic.      Right Ear: External ear normal.      Left Ear: External ear normal.      Nose: No nasal tenderness, mucosal edema, congestion or rhinorrhea.      Right Turbinates: Not enlarged or swollen.      Left Turbinates: Not enlarged or swollen.      Right Sinus: No maxillary sinus tenderness or frontal sinus tenderness.      Left Sinus: No maxillary sinus tenderness or frontal sinus tenderness.      Mouth/Throat:      Lips: Pink.      Mouth: Mucous membranes are moist. No oral lesions.      Dentition: Normal dentition.      Tongue: No lesions.      Palate: No mass.      Pharynx:  Oropharynx is clear. Uvula midline. No oropharyngeal exudate or posterior oropharyngeal erythema.      Comments: Unable to fully visualize the posterior pharynx.  Eyes:      General: Lids are normal.         Right eye: No discharge.         Left eye: No discharge.      Conjunctiva/sclera: Conjunctivae normal.   Neck:      Thyroid: No thyroid mass or thyroid tenderness.      Vascular: No carotid bruit.      Trachea: Trachea normal.   Cardiovascular:      Rate and Rhythm: Normal rate and regular rhythm.      Pulses:           Radial pulses are 2+ on the right side and 2+ on the left side.      Heart sounds: Normal heart sounds, S1 normal and S2 normal. No murmur heard.  Pulmonary:      Effort: Pulmonary effort is normal.      Breath sounds: Normal breath sounds and air entry. No decreased breath sounds, wheezing, rhonchi or rales.   Abdominal:      General: Abdomen is protuberant. Bowel sounds are normal.      Palpations: Abdomen is soft. There is no mass.      Tenderness: There is no abdominal tenderness. There is no right CVA tenderness or left CVA tenderness.   Genitourinary:     Comments: Exam deferred.  Musculoskeletal:      Cervical back: Spasms present. No rigidity, tenderness or bony tenderness. Muscular tenderness present. No spinous process tenderness.      Thoracic back: Normal. No spasms, tenderness or bony tenderness.      Lumbar back: Spasms present. No tenderness or bony tenderness.      Right lower leg: No edema.      Left lower leg: No edema.   Lymphadenopathy:      Cervical: No cervical adenopathy.   Skin:     Comments: Multiple pigmented skin lesion noted   Neurological:      Mental Status: He is alert.      Cranial Nerves: No dysarthria or facial asymmetry.      Motor: No tremor.   Psychiatric:         Attention and Perception: Attention and perception normal.         Mood and Affect: Mood and affect normal.         Speech: Speech normal.         Behavior: Behavior normal. Behavior is cooperative.  Thought Content: Thought content normal. Thought content does not include homicidal or suicidal ideation.      Comments: PHQ-2 score 0          Little interest or pleasure in doing things: 0 (09/08/2024  7:57 AM)  Feeling down, depressed, or hopeless: 0 (09/08/2024  7:57 AM)  PHQ Total Score: 0 (09/08/2024  7:57 AM)      Assessment/Plan     Problem List Items Addressed This Visit          Cardiovascular and Mediastinum    Chronic pulmonary embolism without acute cor pulmonale (CMS/HCC) (Chronic)    No reported concerns with the first follow-up with hematology for now for hypercoagulable workup.         Relevant Medications    atorvastatin  (LIPITOR) 40 MG tablet    Other Relevant Orders    Follow Up In Primary Care    Mixed hyperlipidemia (Chronic)    Repeat labs awaited.  Discussed importance of statins in addition to lowering LDL including plaque stability as well as protective effect on the myocardium. New prescription provided.         Relevant Medications    atorvastatin  (LIPITOR) 40 MG tablet    Other Relevant Orders    Follow Up In Primary Care       Respiratory    Mild persistent asthma without complication (Chronic)    Remains on Advair  daily and using albuterol  inhaler as needed managed by pulmonologist.         Relevant Orders    Follow Up In Primary Care       Digestive    Gastroesophageal reflux disease without esophagitis (Chronic)    Recent records including EGD reviewed and discussed.  Recommended to avoid triggers.         Relevant Orders    Follow Up In Primary Care       Nervous and Auditory    Cervical radiculopathy (Chronic)    Referral provided for pain management.         Relevant Orders    Referral to Pain Clinic       Musculoskeletal and Integument    Osteoarthritis of lumbar spine (Chronic)    Referral provided for pain management.         Relevant Orders    Referral to Pain Clinic       Other    PTSD (post-traumatic stress disorder) (Chronic)    Not on pharmacotherapy and reports doing  well with lifestyle changes including exercise.         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  Recommended to follow-up in case of any concerns in near future because of fungal therapy.  Detailed discussion done about red flag signs including SI/HI counseling and supportive care provided.         Relevant Orders    Follow Up In Primary Care    Insomnia, unspecified type (Chronic)    Reemphasized the importance of sleep hygiene.  Discussed could consider SSRI/SNRI that may help with both underlying mood disorder as well as insomnia but deferred for now.         Relevant Orders    Follow Up In Primary Care    Seasonal allergies (Chronic)    Refill provided for flonase  as requested.  Relevant Medications    fluticasone  (FLONASE ) 50 MCG/ACT nasal spray    Former smoker    Order reprinted for low-dose lung CT as requested.         Screening for colorectal cancer    Denies any current concerns.         Relevant Orders    Follow Up In Primary Care    Overweight (BMI 25.0-29.9)      Body mass index is 29.62 kg/m.  - Pt's weight has decreased compared to most recent documented weights.  - Counseled on exercise including obtaining at least 150 minutes of aerobic exercise per week and eating a healthy well balanced diet.  - Weight loss and lifestyle modification strategies discussed with patient.  - Behavioral strategies discussed -self monitoring, tracking dietary intake, identify food triggers, stimulus control, mental restructuring.  - Dietary interventions discussed - calorie reduction, healthy diet choices with an emphasis on plant-based foods, lean proteins, and reducing high-carbohydrate foods.  - Informal exercise measures discussed, e.g. taking stairs instead of elevator, obtaining 5K-10K steps per day.  - Regular aerobic exercise program discussed - goal  150 mins of aerobic exercise per week.             Attention and concentration deficit    Not on pharmacotherapy or had evaluation done and defers for now.         Need for vaccination    Recommend influenza vaccine and pneumonia vaccine.         Relevant Orders    Follow Up In Primary Care    Encounter for prostate cancer screening    PSA level awaited.         Annual physical exam - Primary    Visit Type: Health Maintenance Visit  Work Status: working part time  Reported Health: good health  Diet: compliant with well-balanced diet  Exercise: regularly  Dental: dentist visit within 6-12 months  Vision: glasses and eye exam > 1 year ago  Hearing: normal hearing  Immunization Status: flu vaccine and PNA vaccine  Reproductive Health: sexually active  Prior Screening Tests: PSA 2023, last colonoscopy in 2023, last LDCT in 2022, and dexa scan not appropriate at this time  General Health Risks: family history of prostate cancer, no family history of colon cancer, and previous colon polyps  Safety Elements Used: uses seat belts, smoke detectors in household, carbon monoxide detectors in household, sunscreen use, and does not text and drive      6/78/7977     8:50 PM 11/05/2021     9:31 AM 07/25/2022    11:33 AM 08/26/2022     7:50 AM 09/20/2022     9:47 AM 07/15/2024    12:28 PM 07/16/2024     7:42 AM   Ambulatory Screenings   Depression: PHQ2 Total Score 6  4  3  2  2  3  3    Depression: PHQ9 Total Score 12 14 5 8 3 8  8        Patient-reported    Data saved with a previous flowsheet row definition      Smoking status: former smoker; quit 2018           Relevant Orders    Follow Up In Primary Care    Elevated blood pressure reading in office without diagnosis of hypertension    Initial blood pressure elevated but repeat improved but still above goal.  Advised ambulatory blood pressure monitoring along with emphasis on lifestyle changes  and to follow-up within the next 2 to [redacted] weeks along with blood pressure machine to calibrate  readings to review to see if may need to consider pharmacotherapy.         Relevant Orders    Follow Up In Primary Care    Chronic left shoulder pain    Advised supportive measures including at home exercises and physical therapy.  Recommended to reach out and or follow-up in case symptoms persist or worsen to consider advanced imaging such as MRI and or referral to subspecialty.         Relevant Orders    Referral to Physical Therapy-[facility]     Other Visit Diagnoses         High risk medication use

## 2024-09-08 NOTE — Patient Instructions (Signed)
Low Back Pain Exercises     . Standing hamstring stretch: Place the heel of your injured leg on a stool about 15 inches high. Keep your knee straight. Lean forward, bending at the hips until you feel a mild stretch in the back of your thigh. Make sure you do not roll your shoulders and bend at the waist when doing this or you will stretch your lower back instead of your leg. Hold the stretch for 15 to 30 seconds. Repeat 3 times.   . Cat and camel: Get down on your hands and knees. Let your stomach sag, allowing your back to curve downward. Hold this position for 5 seconds. Then arch your back and hold for 5 seconds. Do 3 sets of 10.   . Quadruped arm/leg raise: Get down on your hands and knees. Tighten your abdominal muscles to stiffen your spine. While keeping your abdominals tight, raise one arm and the opposite leg away from you. Hold this position for 5 seconds. Lower your arm and leg slowly and alternate sides. Do this 10 times on each side.   . Pelvic tilt: Lie on your back with your knees bent and your feet flat on the floor. Tighten your abdominal muscles and push your lower back into the floor. Hold this position for 5 seconds, then relax. Do 3 sets of 10.   . Partial curl: Lie on your back with your knees bent and your feet flat on the floor. Tighten your stomach muscles. Tuck your chin to your chest. With your hands stretched out in front of you, curl your upper body forward until your shoulders clear the floor. Hold this position for 3 seconds. Don't hold your breath. It helps to breathe out as you lift your shoulders up. Relax. Repeat 10 times. Build to 3 sets of 10. To challenge yourself, clasp your hands behind your head and keep your elbows out to the side.   . Gluteal stretch: Lying on your back with both knees bent, rest the ankle of one leg over the knee of your other leg. Grasp the thigh of the bottom leg and pull that knee toward your chest. You will feel a stretch along the buttocks and  possibly along the outside of your hip on the top leg. Hold this for 15 to 30 seconds. Repeat 3 times.   . Extension exercise: Lie face down on the floor for 5 minutes. If this hurts too much, lie face down with a pillow under your stomach. This should relieve your leg or back pain. When you can lie on your stomach for 5 minutes without a pillow, then you can continue with the rest of this exercise.   After lying on your stomach for 5 minutes, prop yourself up on your elbows for another 5 minutes. Lie flat again for 1 minute, then press down on your hands and extend your elbows while keeping your hips flat on the floor. Hold for 1 second and lower yourself to the floor. Repeat 10 times. Do 4 sets. Rest for 2 minutes between sets. You should have no pain in your legs when you do this, but it is normal to feel pain in your lower back. Do this several times a day.   . Side Plank: Lie on your side with your legs, hips, and shoulders in a straight line. Prop yourself up onto your forearm so your elbow is directly under your shoulder. Lift your hips off the floor and balance on your forearm and   the outside of your foot. Try to hold this position for 15 seconds, then slowly lower your hip to the ground. Switch sides and repeat. Work up to holding for 1 minute or longer. This exercise can be made easier by starting with your knees and hips flexed to 45 degree angles.

## 2024-09-08 NOTE — Assessment & Plan Note (Signed)
 Remains on Advair  daily and using albuterol  inhaler as needed managed by pulmonologist.

## 2024-09-13 ENCOUNTER — Ambulatory Visit: Attending: Family Medicine

## 2024-09-13 DIAGNOSIS — I2782 Chronic pulmonary embolism: Secondary | ICD-10-CM | POA: Insufficient documentation

## 2024-09-28 ENCOUNTER — Other Ambulatory Visit (INDEPENDENT_AMBULATORY_CARE_PROVIDER_SITE_OTHER): Payer: Self-pay | Admitting: Critical Care Medicine

## 2024-09-28 ENCOUNTER — Other Ambulatory Visit (INDEPENDENT_AMBULATORY_CARE_PROVIDER_SITE_OTHER): Payer: Self-pay | Admitting: Family Medicine

## 2024-09-28 DIAGNOSIS — I2699 Other pulmonary embolism without acute cor pulmonale: Secondary | ICD-10-CM

## 2024-09-28 DIAGNOSIS — J4530 Mild persistent asthma, uncomplicated: Secondary | ICD-10-CM

## 2024-09-30 NOTE — Telephone Encounter (Signed)
 Pt last seen 09/20/22 - canceled 11/2022 , no follow up on file     Rx refill denied for albuterol  , per protocol pt needs to schedule to be seen.     My chart message sent

## 2024-10-02 ENCOUNTER — Other Ambulatory Visit (INDEPENDENT_AMBULATORY_CARE_PROVIDER_SITE_OTHER): Payer: Self-pay | Admitting: Critical Care Medicine

## 2024-10-02 ENCOUNTER — Other Ambulatory Visit (INDEPENDENT_AMBULATORY_CARE_PROVIDER_SITE_OTHER): Payer: Self-pay | Admitting: Family Medicine

## 2024-10-02 DIAGNOSIS — J4530 Mild persistent asthma, uncomplicated: Secondary | ICD-10-CM

## 2024-10-02 DIAGNOSIS — I2699 Other pulmonary embolism without acute cor pulmonale: Secondary | ICD-10-CM

## 2024-10-10 ENCOUNTER — Emergency Department: Admission: EM | Admit: 2024-10-10 | Discharge: 2024-10-10 | Disposition: A | Discharge status: Home / Self Care

## 2024-10-10 ENCOUNTER — Emergency Department

## 2024-10-10 DIAGNOSIS — J45909 Unspecified asthma, uncomplicated: Secondary | ICD-10-CM | POA: Insufficient documentation

## 2024-10-10 DIAGNOSIS — R0602 Shortness of breath: Secondary | ICD-10-CM

## 2024-10-10 DIAGNOSIS — J069 Acute upper respiratory infection, unspecified: Secondary | ICD-10-CM | POA: Insufficient documentation

## 2024-10-10 DIAGNOSIS — Z87891 Personal history of nicotine dependence: Secondary | ICD-10-CM | POA: Insufficient documentation

## 2024-10-10 DIAGNOSIS — K047 Periapical abscess without sinus: Secondary | ICD-10-CM | POA: Insufficient documentation

## 2024-10-10 LAB — LAB USE ONLY - CBC WITH DIFFERENTIAL
Absolute Basophils: 0.01 x10 3/uL (ref 0.00–0.08)
Absolute Eosinophils: 0.15 x10 3/uL (ref 0.00–0.44)
Absolute Immature Granulocytes: 0.02 x10 3/uL (ref 0.00–0.07)
Absolute Lymphocytes: 1.98 x10 3/uL (ref 0.42–3.22)
Absolute Monocytes: 0.72 x10 3/uL (ref 0.21–0.85)
Absolute Neutrophils: 3.76 x10 3/uL (ref 1.10–6.33)
Absolute nRBC: 0 x10 3/uL (ref <=0.00)
Basophils %: 0.2 %
Eosinophils %: 2.3 %
Hematocrit: 40.5 % (ref 37.6–49.6)
Hemoglobin: 13.7 g/dL (ref 12.5–17.1)
Immature Granulocytes %: 0.3 %
Lymphocytes %: 29.8 %
MCH: 31.9 pg (ref 25.1–33.5)
MCHC: 33.8 g/dL (ref 31.5–35.8)
MCV: 94.2 fL (ref 78.0–96.0)
MPV: 10.6 fL (ref 8.9–12.5)
Monocytes %: 10.8 %
Neutrophils %: 56.6 %
Platelet Count: 209 x10 3/uL (ref 142–346)
Preliminary Absolute Neutrophil Count: 3.76 x10 3/uL (ref 1.10–6.33)
RBC: 4.3 x10 6/uL (ref 4.20–5.90)
RDW: 12 % (ref 11–15)
WBC: 6.64 x10 3/uL (ref 3.10–9.50)
nRBC %: 0 /100{WBCs} (ref <=0.0)

## 2024-10-10 LAB — COMPREHENSIVE METABOLIC PANEL
ALT: 23 U/L (ref <=55)
AST (SGOT): 22 U/L (ref <=41)
Albumin/Globulin Ratio: 1.4 (ref 0.9–2.2)
Albumin: 4.1 g/dL (ref 3.8–5.0)
Alkaline Phosphatase: 57 U/L (ref 37–117)
Anion Gap: 7 (ref 5.0–15.0)
BUN: 21 mg/dL (ref 9–28)
Bilirubin, Total: 0.3 mg/dL (ref 0.2–1.2)
CO2: 25 meq/L (ref 17–29)
Calcium: 8.8 mg/dL (ref 8.5–10.5)
Chloride: 106 meq/L (ref 99–111)
Creatinine: 1.1 mg/dL (ref 0.5–1.5)
GFR: >60 mL/min/1.73 m2 (ref >=60.0)
Globulin: 2.9 g/dL (ref 2.0–3.6)
Glucose: 83 mg/dL (ref 70–100)
Potassium: 4.4 meq/L (ref 3.5–5.3)
Protein, Total: 7 g/dL (ref 6.0–8.3)
Sodium: 138 meq/L (ref 135–145)

## 2024-10-10 LAB — COVID-19 (SARS-COV-2) & INFLUENZA  A/B, NAA (ROCHE LIAT)
Influenza A RNA: NOT DETECTED
Influenza B RNA: NOT DETECTED
SARS-CoV-2 (COVID-19) RNA: NOT DETECTED

## 2024-10-10 LAB — ECG 12-LEAD
Atrial Rate: 73 {beats}/min
P Axis: 26 degrees
P-R Interval: 164 ms
Q-T Interval: 396 ms
QRS Duration: 78 ms
QTC Calculation (Bezet): 436 ms
R Axis: -29 degrees
T Axis: 21 degrees
Ventricular Rate: 73 {beats}/min

## 2024-10-10 LAB — HIGH SENSITIVITY TROPONIN-I: hs Troponin: <2.7 ng/L (ref <=35.0)

## 2024-10-10 MED ORDER — AMOXICILLIN 500 MG PO CAPS
500.0000 mg | ORAL_CAPSULE | Freq: Three times a day (TID) | ORAL | 0 refills | Status: DC
Start: 2024-10-11 — End: 2024-10-20

## 2024-10-10 MED ORDER — AMOXICILLIN 250 MG PO CAPS
1000.0000 mg | ORAL_CAPSULE | Freq: Once | ORAL | Status: AC
Start: 2024-10-10 — End: 2024-10-10
  Administered 2024-10-10: 1000 mg via ORAL
  Filled 2024-10-10: qty 4

## 2024-10-10 NOTE — ED Notes (Addendum)
 Pt answered sometimes true for food insecurity for SDOH questions. RN offered referral to social work and resources. Pt refused stating, I know where to go.

## 2024-10-10 NOTE — Discharge Instructions (Addendum)
 Please f/u with dentist as soon as possible.
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
## 2024-10-19 ENCOUNTER — Telehealth: Payer: Self-pay

## 2024-10-19 NOTE — Telephone Encounter (Signed)
 LVM for PT to confirm appt with Rollene MICAEL Ree, PA-C for October 20, 2024 at 8:00am    [place], [place]

## 2024-10-20 ENCOUNTER — Encounter: Payer: Self-pay | Admitting: Physician Assistant

## 2024-10-20 ENCOUNTER — Ambulatory Visit
Admission: RE | Admit: 2024-10-20 | Discharge: 2024-10-20 | Disposition: A | Discharge status: Home / Self Care | Source: Ambulatory Visit | Attending: Family Medicine | Admitting: Family Medicine

## 2024-10-20 VITALS — BP 121/82 | HR 65

## 2024-10-20 DIAGNOSIS — M545 Low back pain, unspecified: Secondary | ICD-10-CM | POA: Insufficient documentation

## 2024-10-20 DIAGNOSIS — M5412 Radiculopathy, cervical region: Secondary | ICD-10-CM | POA: Insufficient documentation

## 2024-10-20 DIAGNOSIS — M4802 Spinal stenosis, cervical region: Secondary | ICD-10-CM | POA: Insufficient documentation

## 2024-10-20 DIAGNOSIS — F40240 Claustrophobia: Secondary | ICD-10-CM

## 2024-10-20 DIAGNOSIS — G8929 Other chronic pain: Secondary | ICD-10-CM | POA: Insufficient documentation
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
## 2024-11-30 ENCOUNTER — Telehealth: Payer: Self-pay | Admitting: Anesthesiology

## 2024-11-30 NOTE — Telephone Encounter (Signed)
 Pt called to reschedule appt due to MRI being scheduled after the appt and was rescheduled to 1/28 at 2:30pm with Dr Thelma for injection at [facility]

## 2024-12-02 ENCOUNTER — Other Ambulatory Visit (INDEPENDENT_AMBULATORY_CARE_PROVIDER_SITE_OTHER): Payer: Self-pay | Admitting: Critical Care Medicine

## 2024-12-02 DIAGNOSIS — J4530 Mild persistent asthma, uncomplicated: Secondary | ICD-10-CM

## 2024-12-02 NOTE — Telephone Encounter (Signed)
 Pt Over due for an appt pt made appt for Jan 07, 2025 and is asking for temp refill for the meantime. Please advise

## 2025-01-06 ENCOUNTER — Ambulatory Visit: Admitting: Anesthesiology

## 2025-01-07 ENCOUNTER — Ambulatory Visit (INDEPENDENT_AMBULATORY_CARE_PROVIDER_SITE_OTHER): Admitting: Critical Care Medicine

## 2025-01-10 ENCOUNTER — Encounter (INDEPENDENT_AMBULATORY_CARE_PROVIDER_SITE_OTHER)

## 2025-01-12 ENCOUNTER — Ambulatory Visit
Admission: RE | Admit: 2025-01-12 | Discharge: 2025-01-12 | Disposition: A | Discharge status: Home / Self Care | Source: Ambulatory Visit | Attending: Physician Assistant | Admitting: Physician Assistant

## 2025-01-12 ENCOUNTER — Ambulatory Visit: Admission: RE | Admit: 2025-01-12 | Source: Ambulatory Visit

## 2025-01-20 ENCOUNTER — Telehealth: Payer: Self-pay | Admitting: Physician Assistant

## 2025-01-20 NOTE — Telephone Encounter (Addendum)
 Called patient per provider request to get MRI clarification as 1/14 MRI appt was cancelled by patient. MRI required by insurance to obtain authorization for injection scheduled on 1/28 per auth team. No answer, lvmtcb.   -----  1/27: Attempted to reach patient again per auth team request. Goes straight to vm. Left detailed message advising of above and if we do not hear from patient, 1/28 injection will be canceled or changed to follow up per patient preference.

## 2025-01-25 ENCOUNTER — Telehealth: Payer: Self-pay | Admitting: Anesthesiology

## 2025-01-25 NOTE — Telephone Encounter (Signed)
 Called pt regarding his injection scheduled for tomorrow, due to not receiving the MRI reports requested from the last visit, we will not be performing any procedure tomorrow (1/28) and will be changing it to a follow up appt unless requested to reschedule the injection to a different date.     Pt did not answer but LVMTCB If there are any questions.

## 2025-01-26 ENCOUNTER — Ambulatory Visit: Admitting: Anesthesiology

## 2025-02-02 ENCOUNTER — Emergency Department
Admission: EM | Admit: 2025-02-02 | Discharge: 2025-02-02 | Disposition: A | Discharge status: Home / Self Care | Attending: Student in an Organized Health Care Education/Training Program | Admitting: Student in an Organized Health Care Education/Training Program

## 2025-02-02 DIAGNOSIS — J069 Acute upper respiratory infection, unspecified: Secondary | ICD-10-CM

## 2025-02-02 LAB — COVID-19 (SARS-COV-2) & INFLUENZA  A/B, NAA (ROCHE LIAT)
Influenza A RNA: NOT DETECTED
Influenza B RNA: NOT DETECTED
SARS-CoV-2 (COVID-19) RNA: NOT DETECTED

## 2025-02-02 LAB — GROUP A STREP, RAPID ANTIGEN: Group A Strep, Rapid Antigen: NEGATIVE

## 2025-02-02 MED ORDER — FLUTICASONE PROPIONATE 50 MCG/ACT NA SUSP: 2.0000 | Freq: Every day | NASAL | 0 refills | Status: AC

## 2025-02-02 MED ORDER — DEXTROMETHORPHAN-GUAIFENESIN ER 30-600 MG PO TB12: 1.0000 | ORAL_TABLET | Freq: Two times a day (BID) | ORAL | 0 refills | Status: AC

## 2025-02-02 NOTE — Discharge Instructions (Addendum)
 Flonase  daily.  Mucinex  DM for cough and to thin mucus.  Can use saline nasal spray/rinses.  Increase fluids.  Handwashing.  Follow-up with PCP for no improvement after 7-10 days of illness.  Seek immediate medical attention for new or worsening symptoms.

## 2025-02-02 NOTE — ED Provider Notes (Signed)
 [facility]  Emergency Department Physician Attestation Note        I performed the substantive portion of the MDM. For the problems addressed, I personally developed, reviewed, and/or approved the plan and assessment as documented by the APP.  Patient was seen by APP, see their note for further documentation of history and exam.    Brief History of Present Illness and Additional Exam Findings     Chief Complaint: Flu like symptoms       History per APP    59 y.o. male presents with URI symptoms.            Medical Decision Making     59 y.o. male presents with URI symptoms.  Likely viral syndrome.  Will test for COVID flu, strep.  Anticipate discharge.        Triage Vitals:  ED Triage Vitals [02/02/25 1638]   Encounter Vitals Group      BP 133/86      Girls Systolic BP Percentile       Girls Diastolic BP Percentile       Boys Systolic BP Percentile       Boys Diastolic BP Percentile       Heart Rate 74      Resp Rate 18      Temp 98.1 F (36.7 C)      Temp src Temporal      SpO2 98 %      Weight 106.6 kg      Height 1.905 m      Head Circumference       Peak Flow       Pain Score 6      Pain Loc       Pain Education       Exclude from Growth Chart         Vital Signs: Reviewed the patients vital signs.   Nursing Notes: Reviewed and utilized available nursing notes.  Medical Records Reviewed: Reviewed available past medical records.    CARDIAC STUDIES    The following cardiac studies were independently interpreted by me the Emergency Medicine Physician.  For full cardiac study results please see chart. I discussed testing results with the APP.        EMERGENCY IMAGING STUDIES    The following imagine studies were independently interpreted by me (emergency medicine physician). I discussed testing results with the APP.      RADIOLOGY IMAGING STUDIES      No orders to display       EMERGENCY DEPT. MEDICATIONS      ED Medication Orders (From admission, onward)      None            LABORATORY RESULTS     Ordered and independently interpreted AVAILABLE laboratory tests.          CRITICAL CARE/PROCEDURES      Diagnosis/Disposition     I (ED Physician) discussed final disposition with the APP    Diagnosis:  Final diagnoses:   None       Disposition:  ED Disposition       None            Prescriptions:  Patient's Medications   New Prescriptions    No medications on file   Previous Medications    ALBUTEROL  SULFATE HFA (PROVENTIL ) 108 (90 BASE) MCG/ACT INHALER    INHALE 2 PUFFS INTO THE LUNGS EVERY 4 HOURS AS NEEDED FOR WHEEZE  ALPHA LIPOIC ACID 200 MG CAP    Take by mouth    AMINO ACIDS (AMINO ACID PO)    Take by mouth    ASCORBIC ACID (VITAMIN C) 1000 MG TABLET    Take 1 tablet (1,000 mg) by mouth once daily 2-3 tablets    ATORVASTATIN  (LIPITOR) 40 MG TABLET    Take 1 tablet (40 mg) by mouth once daily    B COMPLEX VITAMINS CAPSULE    Take 1 capsule by mouth every other day    CHOLECALCIFEROL (VITAMIN D3) 3000 UNITS TAB    Take by mouth    FLUTICASONE  (FLONASE ) 50 MCG/ACT NASAL SPRAY    2 sprays by Nasal route once daily for 7 days    FLUTICASONE -SALMETEROL (ADVAIR  DISKUS) 250-50 MCG/ACT AEROSOL PWDR, BREATH ACTIVATED    INHALE 1 PUFF INTO THE LUNGS TWICE A DAY    MAGNESIUM 30 MG TABLET    Take 1 tablet (30 mg) by mouth 2 (two) times daily    OMEPRAZOLE  (PRILOSEC) 40 MG CAPSULE    Take 1 capsule (40 mg) by mouth 2 (two) times daily    VITAMIN A 10000 UNIT CAPSULE    Take 1 capsule (10,000 Units) by mouth   Modified Medications    No medications on file   Discontinued Medications    No medications on file          Quindon Denker, MD  02/02/25 1652

## 2025-02-02 NOTE — ED Triage Notes (Signed)
 Pt ambulatory to ED c/o bilateral ear pain, sneezing, sore throat, chills, difficulty swallowing, productive cough, and congestion. Denies CP or SOB.     PMH: asthma    Pt A&Ox4. Airway patent; respirations even and unlabored.

## 2025-02-03 LAB — LAB USE ONLY - ESWAB HOLD TUBE
